# Patient Record
Sex: Male | Born: 1950 | Race: Black or African American | Hispanic: No | Marital: Married | Smoking: Never smoker
Health system: Southern US, Community
[De-identification: ages and names within clinical notes are randomized; demographics above are authoritative.]

## PROBLEM LIST (undated history)

## (undated) DIAGNOSIS — E7800 Pure hypercholesterolemia, unspecified: Secondary | ICD-10-CM

## (undated) DIAGNOSIS — I10 Essential (primary) hypertension: Secondary | ICD-10-CM

## (undated) HISTORY — PX: NO PAST SURGERIES: SHX2092

## (undated) HISTORY — DX: Essential (primary) hypertension: I10

---

## 1998-08-24 ENCOUNTER — Emergency Department (HOSPITAL_COMMUNITY): Admission: EM | Admit: 1998-08-24 | Discharge: 1998-08-24 | Payer: Self-pay | Admitting: Emergency Medicine

## 2000-06-15 ENCOUNTER — Ambulatory Visit (HOSPITAL_COMMUNITY): Admission: RE | Admit: 2000-06-15 | Discharge: 2000-06-15 | Payer: Self-pay | Admitting: *Deleted

## 2000-06-15 ENCOUNTER — Encounter: Payer: Self-pay | Admitting: Family Medicine

## 2003-09-14 ENCOUNTER — Emergency Department (HOSPITAL_COMMUNITY): Admission: EM | Admit: 2003-09-14 | Discharge: 2003-09-14 | Payer: Self-pay | Admitting: Emergency Medicine

## 2003-09-17 ENCOUNTER — Emergency Department (HOSPITAL_COMMUNITY): Admission: EM | Admit: 2003-09-17 | Discharge: 2003-09-17 | Payer: Self-pay | Admitting: Emergency Medicine

## 2003-09-18 ENCOUNTER — Ambulatory Visit (HOSPITAL_COMMUNITY): Admission: RE | Admit: 2003-09-18 | Discharge: 2003-09-18 | Payer: Self-pay | Admitting: Family Medicine

## 2005-01-04 ENCOUNTER — Emergency Department (HOSPITAL_COMMUNITY): Admission: EM | Admit: 2005-01-04 | Discharge: 2005-01-04 | Payer: Self-pay | Admitting: Family Medicine

## 2006-05-26 ENCOUNTER — Emergency Department (HOSPITAL_COMMUNITY): Admission: EM | Admit: 2006-05-26 | Discharge: 2006-05-26 | Payer: Self-pay | Admitting: Emergency Medicine

## 2007-08-28 ENCOUNTER — Emergency Department (HOSPITAL_COMMUNITY): Admission: EM | Admit: 2007-08-28 | Discharge: 2007-08-28 | Payer: Self-pay | Admitting: Family Medicine

## 2007-09-04 ENCOUNTER — Emergency Department (HOSPITAL_COMMUNITY): Admission: EM | Admit: 2007-09-04 | Discharge: 2007-09-04 | Payer: Self-pay | Admitting: Family Medicine

## 2008-10-23 ENCOUNTER — Emergency Department (HOSPITAL_COMMUNITY): Admission: EM | Admit: 2008-10-23 | Discharge: 2008-10-23 | Payer: Self-pay | Admitting: Emergency Medicine

## 2010-04-01 ENCOUNTER — Emergency Department (HOSPITAL_COMMUNITY): Admission: EM | Admit: 2010-04-01 | Discharge: 2010-04-01 | Payer: Self-pay | Admitting: Family Medicine

## 2010-09-29 LAB — POCT URINALYSIS DIP (DEVICE)
Bilirubin Urine: NEGATIVE
Glucose, UA: NEGATIVE mg/dL
Hgb urine dipstick: NEGATIVE
Ketones, ur: NEGATIVE mg/dL
Nitrite: NEGATIVE
Protein, ur: NEGATIVE mg/dL
Specific Gravity, Urine: 1.015 (ref 1.005–1.030)
Urobilinogen, UA: 1 mg/dL (ref 0.0–1.0)
pH: 5.5 (ref 5.0–8.0)

## 2011-03-15 LAB — POCT URINALYSIS DIP (DEVICE)
Glucose, UA: NEGATIVE
Glucose, UA: NEGATIVE
Hgb urine dipstick: NEGATIVE
Ketones, ur: NEGATIVE
Nitrite: POSITIVE — AB
Operator id: 235561
Operator id: 116391
Protein, ur: 30 — AB
Protein, ur: NEGATIVE
Specific Gravity, Urine: 1.015
Specific Gravity, Urine: 1.02
Urobilinogen, UA: 1
Urobilinogen, UA: 0.2
pH: 5.5

## 2011-03-15 LAB — POCT I-STAT, CHEM 8
BUN: 15
Calcium, Ion: 1.17
Chloride: 102
Creatinine, Ser: 1.6 — ABNORMAL HIGH
Glucose, Bld: 112 — ABNORMAL HIGH
HCT: 43
Hemoglobin: 14.6
Potassium: 3.5
Sodium: 139
TCO2: 26

## 2013-07-16 ENCOUNTER — Emergency Department (HOSPITAL_COMMUNITY): Payer: Medicare Other

## 2013-07-16 ENCOUNTER — Encounter (HOSPITAL_COMMUNITY): Payer: Self-pay | Admitting: Emergency Medicine

## 2013-07-16 ENCOUNTER — Emergency Department (HOSPITAL_COMMUNITY)
Admission: EM | Admit: 2013-07-16 | Discharge: 2013-07-16 | Disposition: A | Discharge status: Home / Self Care | Payer: Medicare Other | Attending: Emergency Medicine | Admitting: Emergency Medicine

## 2013-07-16 DIAGNOSIS — R509 Fever, unspecified: Secondary | ICD-10-CM | POA: Insufficient documentation

## 2013-07-16 DIAGNOSIS — K529 Noninfective gastroenteritis and colitis, unspecified: Secondary | ICD-10-CM

## 2013-07-16 DIAGNOSIS — R42 Dizziness and giddiness: Secondary | ICD-10-CM | POA: Insufficient documentation

## 2013-07-16 DIAGNOSIS — R5381 Other malaise: Secondary | ICD-10-CM | POA: Insufficient documentation

## 2013-07-16 DIAGNOSIS — R05 Cough: Secondary | ICD-10-CM | POA: Insufficient documentation

## 2013-07-16 DIAGNOSIS — K5289 Other specified noninfective gastroenteritis and colitis: Secondary | ICD-10-CM | POA: Insufficient documentation

## 2013-07-16 DIAGNOSIS — M25569 Pain in unspecified knee: Secondary | ICD-10-CM | POA: Insufficient documentation

## 2013-07-16 DIAGNOSIS — R059 Cough, unspecified: Secondary | ICD-10-CM | POA: Insufficient documentation

## 2013-07-16 DIAGNOSIS — R5383 Other fatigue: Secondary | ICD-10-CM

## 2013-07-16 LAB — BASIC METABOLIC PANEL
BUN: 21 mg/dL (ref 6–23)
CHLORIDE: 100 meq/L (ref 96–112)
CO2: 24 mEq/L (ref 19–32)
Calcium: 9.3 mg/dL (ref 8.4–10.5)
Creatinine, Ser: 1.24 mg/dL (ref 0.50–1.35)
GFR calc non Af Amer: 62 mL/min — ABNORMAL LOW (ref >90)
GFR, EST AFRICAN AMERICAN: 71 mL/min — AB (ref >90)
Glucose, Bld: 98 mg/dL (ref 70–99)
POTASSIUM: 3.7 meq/L (ref 3.7–5.3)
SODIUM: 140 meq/L (ref 137–147)

## 2013-07-16 LAB — HEPATIC FUNCTION PANEL
ALT: 30 U/L (ref 0–53)
AST: 29 U/L (ref 0–37)
Albumin: 4.3 g/dL (ref 3.5–5.2)
Alkaline Phosphatase: 78 U/L (ref 39–117)
Bilirubin, Direct: <0.2 mg/dL (ref 0.0–0.3)
TOTAL PROTEIN: 8.4 g/dL — AB (ref 6.0–8.3)
Total Bilirubin: 1.1 mg/dL (ref 0.3–1.2)

## 2013-07-16 LAB — CBC WITH DIFFERENTIAL/PLATELET
BASOS ABS: 0 10*3/uL (ref 0.0–0.1)
BASOS PCT: 0 % (ref 0–1)
Eosinophils Absolute: 0 10*3/uL (ref 0.0–0.7)
Eosinophils Relative: 0 % (ref 0–5)
HCT: 46.2 % (ref 39.0–52.0)
HEMOGLOBIN: 16.7 g/dL (ref 13.0–17.0)
Lymphocytes Relative: 3 % — ABNORMAL LOW (ref 12–46)
Lymphs Abs: 0.3 10*3/uL — ABNORMAL LOW (ref 0.7–4.0)
MCH: 31.8 pg (ref 26.0–34.0)
MCHC: 36.1 g/dL — AB (ref 30.0–36.0)
MCV: 88 fL (ref 78.0–100.0)
MONOS PCT: 3 % (ref 3–12)
Monocytes Absolute: 0.3 10*3/uL (ref 0.1–1.0)
NEUTROS ABS: 9 10*3/uL — AB (ref 1.7–7.7)
NEUTROS PCT: 94 % — AB (ref 43–77)
Platelets: 116 10*3/uL — ABNORMAL LOW (ref 150–400)
RBC: 5.25 MIL/uL (ref 4.22–5.81)
RDW: 13.3 % (ref 11.5–15.5)
WBC: 9.6 10*3/uL (ref 4.0–10.5)

## 2013-07-16 LAB — URINALYSIS, ROUTINE W REFLEX MICROSCOPIC
Bilirubin Urine: NEGATIVE
GLUCOSE, UA: NEGATIVE mg/dL
HGB URINE DIPSTICK: NEGATIVE
Ketones, ur: NEGATIVE mg/dL
Leukocytes, UA: NEGATIVE
Nitrite: NEGATIVE
Protein, ur: NEGATIVE mg/dL
UROBILINOGEN UA: 0.2 mg/dL (ref 0.0–1.0)
pH: 5.5 (ref 5.0–8.0)

## 2013-07-16 LAB — POCT I-STAT TROPONIN I
TROPONIN I, POC: 0 ng/mL (ref 0.00–0.08)
Troponin i, poc: 0.01 ng/mL (ref 0.00–0.08)

## 2013-07-16 LAB — LIPASE, BLOOD: Lipase: 22 U/L (ref 11–59)

## 2013-07-16 MED ORDER — SODIUM CHLORIDE 0.9 % IV BOLUS (SEPSIS)
1000.0000 mL | Freq: Once | INTRAVENOUS | Status: AC
  Administered 2013-07-16: 1000 mL via INTRAVENOUS

## 2013-07-16 MED ORDER — MORPHINE SULFATE 4 MG/ML IJ SOLN
4.0000 mg | Freq: Once | INTRAMUSCULAR | Status: AC
  Administered 2013-07-16: 4 mg via INTRAVENOUS
  Filled 2013-07-16: qty 1

## 2013-07-16 MED ORDER — ONDANSETRON HCL 4 MG PO TABS: 4.0000 mg | ORAL_TABLET | Freq: Four times a day (QID) | ORAL | Status: DC | PRN

## 2013-07-16 MED ORDER — IOHEXOL 300 MG/ML  SOLN: 80.0000 mL | Freq: Once | INTRAMUSCULAR | Status: DC | PRN

## 2013-07-16 MED ORDER — IOHEXOL 300 MG/ML  SOLN
100.0000 mL | Freq: Once | INTRAMUSCULAR | Status: AC | PRN
  Administered 2013-07-16: 100 mL via INTRAVENOUS

## 2013-07-16 MED ORDER — ONDANSETRON HCL 4 MG/2ML IJ SOLN
4.0000 mg | Freq: Once | INTRAMUSCULAR | Status: AC
  Administered 2013-07-16: 4 mg via INTRAVENOUS
  Filled 2013-07-16: qty 2

## 2013-07-16 NOTE — ED Notes (Signed)
Pt began having nausea and vomiting and abdominal pain at 3am.  Pt has vomited 10+ times since then. Pt states that his abdominal pain increases with palpation.  Pt had temp 100F, no GU symptoms

## 2013-07-16 NOTE — Discharge Instructions (Signed)
°Emergency Department Resource Guide °1) Find a Doctor and Pay Out of Pocket °Although you won't have to find out who is covered by your insurance plan, it is a good idea to ask around and get recommendations. You will then need to call the office and see if the doctor you have chosen will accept you as a new patient and what types of options they offer for patients who are self-pay. Some doctors offer discounts or will set up payment plans for their patients who do not have insurance, but you will need to ask so you aren't surprised when you get to your appointment. ° °2) Contact Your Local Health Department °Not all health departments have doctors that can see patients for sick visits, but many do, so it is worth a call to see if yours does. If you don't know where your local health department is, you can check in your phone book. The CDC also has a tool to help you locate your state's health department, and many state websites also have listings of all of their local health departments. ° °3) Find a Walk-in Clinic °If your illness is not likely to be very severe or complicated, you may want to try a walk in clinic. These are popping up all over the country in pharmacies, drugstores, and shopping centers. They're usually staffed by nurse practitioners or physician assistants that have been trained to treat common illnesses and complaints. They're usually fairly quick and inexpensive. However, if you have serious medical issues or chronic medical problems, these are probably not your best option. ° °No Primary Care Doctor: °- Call [facility] at  832-8000 - they can help you locate a primary care doctor that  accepts your insurance, provides certain services, etc. °- Physician Referral Service- 1-800-533-3463 ° °Chronic Pain Problems: °Organization         Address  Phone   Notes  °[facility]  (336) 297-2271 Patients need to be referred by their primary care doctor.  ° °Medication  Assistance: °Organization         Address  Phone   Notes  °[place] [facility] [address] °[place], [place] [address] (336) 641-8030 --Must be a resident of [place] °-- Must have NO insurance coverage whatsoever (no Medicaid/ Medicare, etc.) °-- The pt. MUST have a primary care doctor that directs their care regularly and follows them in the community °  °[facility]  (866) 331-1348   °[employer]  (888) 892-1162   ° °Agencies that provide inexpensive medical care: °Organization         Address  Phone   Notes  °[facility]  (336) 832-8035   °[facility]    (336) 832-7272   °[facility] [address] °[place], [place] [address] (336) 832-4777   °[facility] [address], °[place] (336) 271-4999   °[facility]    (336) 373-0678   °[facility]    (336) 272-1050   °[facility] ° [address], [place] Phone:  (336) 832-4444, Fax:  (336) 832-4440 Hours of Operation:  9 am - 6 pm, M-F.  Also accepts Medicaid/Medicare and self-pay.  °[facility] ° [address], [place] Phone: (336) 832-3150, Fax: (336) 832-3151. Hours of Operation:  8:30 am - 5:30 pm, M-F.  Also accepts Medicaid and self-pay.  °[facility] [place] [address], [place] Phone: (336) 878-6027   °[facility] [address], [place], [place] (336)723-1848, Ext. 123 Mondays & Thursdays: 7-9 AM.  First 15 patients are seen on a first come, first serve basis. °  ° °Medicaid-accepting [place] Providers: ° °Organization         Address  Phone   Notes  °[facility] [address], [place] (336) 641-2100 Also accepts self-pay patients.  °[facility] [address], [place] ° (336) 856-9996   °[facility] [address], [place]  (336) 288-8857   °[facility] [address] [place] [address], [place] (336) 299-7000   °Veita Bland [address], [place]  ° (336) 373-1557 Only accepts [place] Access Medicaid patients after they have their name applied to their card.  ° °Self-Pay (no insurance) in [place]: ° °Organization         Address  Phone   Notes  °Sickle Cell Patients, [facility] [address], [place] (336) 832-1970   °[facility] [address], [place] (336) 832-4400   °[facility] ° [address], [place] (336) 992-4800   °[facility]/Dr. Osei-Bonsu ° [address] [place] [address], [place] or [address], [place] (336) 841-8500 Phone number for both [place] and [place] locations is the same.  °[facility] [address], [place] (336) 299-0000   °[facility] [address] [place] [address], [place] or [address] (336) 852-7530 °(336) 878-2260   °[facility] [address], [place] (336) 350-1642, phone; (336) 294-5005, fax Sees patients 1st and 3rd Saturday of every month.  Must not qualify for public or private insurance (i.e. Medicaid, Medicare, [place] Health Choice, Veterans' Benefits) • Household income should be no more than 200% of the poverty level •The clinic cannot treat you if you are pregnant or think you are pregnant • Sexually transmitted diseases are not treated at the clinic.  ° ° °Dental Care: °Organization         Address  Phone  Notes  °[place] [facility] [address], [place] (336) 641-6152 Accepts children up to age 21 who are enrolled in Medicaid or [place] Health Choice; pregnant women with a Medicaid card; and children who have applied for Medicaid or [place] Health Choice, but were declined, whose parents can pay a reduced fee at time of service.  °[place]  [facility] [place]  [address], [place] (336) 641-7733 Accepts children up to age 21 who are enrolled in Medicaid or [place] Health Choice; pregnant women with a Medicaid card; and children who have applied for Medicaid or [place] Health Choice, but were declined, whose parents can pay a reduced fee at time of service.  °[facility] ° [address], [place] (336) 641-4533 Patients are seen by appointment only. Walk-ins are not accepted. [facility] will see patients 18 years of age and older. °Monday - Tuesday (8am-5pm) °Most Wednesdays (8:30-5pm) °$30 per visit, cash only  °[facility] ° [address], [place] (336) 641-4533 Patients are seen by appointment only. Walk-ins are not accepted. [facility] will see patients 18 years of age and older. °One   Wednesday Evening (Monthly: Volunteer Based).  $30 per visit, cash only  °[facility]  (919) 537-3737 for adults; Children under age 4, call Graduate Pediatric Dentistry at (919) [phone]. Children aged 4-14, please call (919) [phone] to request a pediatric application. ° Dental services are provided in all areas of dental care including fillings, crowns and bridges, complete and partial dentures, implants, gum treatment, root canals, and extractions. Preventive care is also provided. Treatment is provided to both adults and children. °Patients are selected via a lottery and there is often a waiting list. °  °[facility] [address], °[place] ° (336) 763-8833 [email or web address] °  °[facility] [address], [place], [place] (336)723-1848, Ext. 123 Second and Fourth Thursday of each month, opens at 6:30 AM; Clinic ends at 9 AM.  Patients are seen on a first-come first-served basis, and a limited number are seen during each clinic.  ° °[facility] ° [address], [place], [place] (336) 723-7904    Eligibility Requirements °You must have lived in [place], [place], or [place] counties for at least the last three months. °  You cannot be eligible for state or federal sponsored healthcare insurance, including Veterans Administration, Medicaid, or Medicare. °  You generally cannot be eligible for healthcare insurance through your employer.  °  How to apply: °Eligibility screenings are held every Tuesday and Wednesday afternoon from 1:00 pm until 4:00 pm. You do not need an appointment for the interview!  °[facility] [address], [place], [place] 336-631-2330   °[facility]  336-342-8273   °[facility]  336-703-3100   °[facility]  336-570-6415   ° °Behavioral Health Resources in the Community: °Intensive Outpatient Programs °Organization         Address  Phone  Notes  °[facility] [address], [place], [place] 336-878-6098   °[facility] [address], [place], [place] 336-832-9800   °[facility] [address], [place], [place] ° 336-882-2125   °[facility] [address],  °[place], [place] 1-800-853-5163 or 336-641-4981   °Substance Abuse Resources °Organization         Address  Phone  Notes  °[facility]  336-882-2125   °[facility]  336-784-9470   °[facility]  336-285-9073   °[facility]  336-845-3988   °Residential & Outpatient Substance Abuse Program  1-800-659-3381   °Psychological Services °Organization         Address  Phone  Notes  °[facility]  336- 832-9600   °[facility]  336- 378-7881   °[facility] [address], [place] 1-800-853-5163 or 336-641-4981   ° °Mobile Crisis Teams °Organization         Address  Phone  Notes  °[facility], Mobile Crisis Care Unit  1-877-626-1772   °Assertive °[facility] ° [address]  [place],  336-834-9664   °Sharon DeEsch [address] °[place]  336-554-5454   ° °Self-Help/Support Groups °Organization         Address  Phone             Notes  °[facility]  - variety of support groups  336- 373-1402 Call for more information  °Narcotics Anonymous (NA), [facility] [address], °[place]   2 meetings at this location  ° °  Residential Treatment Programs °Organization         Address  Phone  Notes  °[facility] [address],    °[place]  1-866-801-8205   °[facility] ° [address], [place], [place] 704-293-8524   °[facility] [address], [place] 336-845-3988 Admissions: 8am-3pm M-F  °[facility] [address]    °[place], [place] 336-841-1104   °[facility] [address], [place], [place] 336-379-7146   °[facility] [address]  °[place], [place] 336-285-9073   °[facility] Intensive Outpatient [address], [place], [place] 336-852-3033   °[facility] [address]  °[place], [place] 1-877-615-2722 or 336-784-9470   °[facility] [address] [place], [place] 336-227-7417 Accepts Medicaid  °[facility] [address]  °[place] 1-800-659-3381 Substance Abuse/Addiction Treatment  ° °[place] Behavioral Health Resources °Organization         Address  Phone  Notes  °[facility]  (888) 581-9988   °Julie Brannon, PhD [address] [place], [place]   (336) 349-5553 or (336) 951-0000   °[facility]   [address] °[place], [place] (336) 349-4454   °[facility] [address], [place], [place] (336) 342-8316 Insurance/Medicaid/sponsorship through [facility]  °[facility] [address] [place] [address]                                    [place],  (336) 342-8316 Therapy/tele-psych/case    °[facility] [address]  ° [place],  (336) 349-2233    °Dr. Arfeen  (336) 349-4544   °[facility] [place]  [employer] [place] [facility] 1) [address], [place] °2) [address], [place] °3)  [address], [place] (336) 349-3220 °(336) 342-7768 ° °(336) 342-8140   °[place] [facility] (336) 342-1394 or (336) 342-3537 (After Hours)    ° ° °

## 2013-07-16 NOTE — Progress Notes (Signed)
   CARE MANAGEMENT ED NOTE 07/16/2013  Patient:  [name],[name]   Account Number:  [id number]  Date Initiated:  07/16/2013  Documentation initiated by:  Livia Snellen  Subjective/Objective Assessment:   Patient presents to Ed with n/v/ and abdominal pain.     Subjective/Objective Assessment Detail:   Patient without pmhx.  CT of abdomen completed.  UA negative.  Patient afebrile     Action/Plan:   Action/Plan Detail:   Anticipated DC Date:  07/16/2013     Status Recommendation to Physician:   Result of Recommendation:    Other ED [place]  PCP issues    Choice offered to / List presented to:            Status of service:  Completed, signed off  ED Comments:   ED Comments Detail:  EDCM spoke to patient at bedside.  Patient reports he does not have a pcp but does have [employer].  EDCM printed up list of pcps who accept AARP insurance within a 5 mile zip code of patient, but patient left before [facility] could provide this list.

## 2013-07-16 NOTE — ED Notes (Addendum)
This RN clicked off the wrong Pt's urine.  It will need to be recollected.

## 2013-07-16 NOTE — ED Provider Notes (Signed)
CSN: [id number]     Arrival date & time 07/16/13  1147 History   First MD Initiated Contact with Patient 07/16/13 1501     Chief Complaint  Patient presents with  . Abdominal Pain   (Consider location/radiation/quality/duration/timing/severity/associated sxs/prior Treatment) HPI Comments: 63 year old male presents with nausea, vomiting, and diarrhea since approximately 15 hours ago. The patient is an overall poor historian and most of the history taken from the wife. He states he woke up in the middle night at midnight and has since had multiple episodes of vomiting. There is no blood in his vomit. He is also had many loose watery stools. A few hours ago started having cramping abdominal pain as well. He points to his lower abdomen as the location of his pain but there is no specific area. The wife states patient had a temperature up to 100 earlier today. Patient is also complaining of bilateral knee pain. Started similar times other symptoms. Patient states he has felt weak and dizzy during this time as well. He states he ate at [employer] last night about 7 hours prior to symptoms but did not feel the food tasted bad. No back pain or urinary symptoms. He had a cough yesterday but none currently. He currently rates his abdominal pain as 10/10. Patient states while he was in the waiting room he felt like his legs were weak and he was dizzy for approximately 10-20 minutes. Does not feel this way now. No dyspnea or chest pain.    History reviewed. No pertinent past medical history. History reviewed. No pertinent past surgical history. No family history on file. History  Substance Use Topics  . Smoking status: Never Smoker   . Smokeless tobacco: Not on file  . Alcohol Use: No    Review of Systems  Constitutional: Positive for fever.  Respiratory: Positive for cough. Negative for shortness of breath.   Cardiovascular: Negative for chest pain.  Gastrointestinal: Positive for nausea, vomiting, abdominal  pain and diarrhea. Negative for constipation and blood in stool.  Genitourinary: Negative for dysuria.  Musculoskeletal: Positive for arthralgias (bilaterals knees hurt). Negative for back pain and myalgias.  Neurological: Positive for dizziness (felt dizzy once in the waiting room) and weakness.  All other systems reviewed and are negative.    Allergies  Review of patient's allergies indicates no known allergies.  Home Medications  No current outpatient prescriptions on file. BP 123/83  Pulse 91  Temp(Src) 98.8 F (37.1 C) (Oral)  Resp 18  SpO2 97% Physical Exam  Nursing note and vitals reviewed. Constitutional: He is oriented to person, place, and time. He appears well-developed and well-nourished. No distress.  HENT:  Head: Normocephalic and atraumatic.  Right Ear: External ear normal.  Left Ear: External ear normal.  Nose: Nose normal.  Eyes: Right eye exhibits no discharge. Left eye exhibits no discharge.  Neck: Neck supple.  Cardiovascular: Normal rate, regular rhythm, normal heart sounds and intact distal pulses.   Pulmonary/Chest: Effort normal and breath sounds normal.  Abdominal: Soft. There is tenderness (mild lower abd tenderness). There is no CVA tenderness.  Musculoskeletal: He exhibits no edema.       Right knee: He exhibits normal range of motion and no swelling. No tenderness found.       Left knee: He exhibits normal range of motion and no swelling. No tenderness found.  Neurological: He is alert and oriented to person, place, and time.  Skin: Skin is warm and dry.    ED Course  Procedures (including critical care time) Labs Review Labs Reviewed  CBC WITH DIFFERENTIAL - Abnormal; Notable for the following:    MCHC 36.1 (*)    Platelets 116 (*)    Neutrophils Relative % 94 (*)    Neutro Abs 9.0 (*)    Lymphocytes Relative 3 (*)    Lymphs Abs 0.3 (*)    All other components within normal limits  BASIC METABOLIC PANEL - Abnormal; Notable for the  following:    GFR calc non Af Amer 62 (*)    GFR calc Af Amer 71 (*)    All other components within normal limits  HEPATIC FUNCTION PANEL - Abnormal; Notable for the following:    Total Protein 8.4 (*)    All other components within normal limits  URINALYSIS, ROUTINE W REFLEX MICROSCOPIC - Abnormal; Notable for the following:    Specific Gravity, Urine >1.046 (*)    All other components within normal limits  LIPASE, BLOOD  POCT I-STAT TROPONIN I  POCT I-STAT TROPONIN I   Imaging Review Ct Abdomen Pelvis W Contrast  07/16/2013   CLINICAL DATA:  Abdominal pain, nausea and vomiting  EXAM: CT ABDOMEN AND PELVIS WITH CONTRAST  TECHNIQUE: Multidetector CT imaging of the abdomen and pelvis was performed using the standard protocol following bolus administration of intravenous contrast.  CONTRAST:  174mL OMNIPAQUE IOHEXOL 300 MG/ML  SOLN  COMPARISON:  None.  FINDINGS: Minimal bibasilar atelectasis. Normal heart size. No pericardial or pleural effusion. No hiatal hernia.  Abdomen: Liver demonstrates a 5 mm punctate hypodensity in the right hepatic dome, image 13 too small to definitively characterize but suspect small hepatic cyst. No other hepatic abnormality. No biliary dilatation. Hepatic and portal veins are patent. Gallbladder, biliary system, pancreas, spleen, adrenal glands, and kidneys are within normal limits for age and demonstrate no acute process. Kidneys demonstrate no renal obstruction or hydronephrosis. Incidental 10 mm cortical cyst in the right kidney midpole, image 36.  No abdominal free fluid, fluid collection, hemorrhage, abscess, or adenopathy.  Negative for bowel obstruction, dilatation, ileus, or free air. Normal appendix.  Atherosclerosis of the aorta without aneurysm.  Diffuse colonic diverticulosis.  No acute inflammatory process.  Pelvis: No pelvic free fluid, fluid collection, hemorrhage, abscess, adenopathy, inguinal abnormality, or hernia. Urinary bladder unremarkable. Pelvic  calcifications consistent with venous phleboliths.  Diffuse degenerative changes noted of the spine with mild scoliosis.  IMPRESSION: No acute intra-abdominal or pelvic finding.  Probable incidental 5 mm hepatic dome cyst  Incidental right renal cyst  Normal appendix  Diverticulosis   Electronically Signed   By: Daryll Brod M.D.   On: 07/16/2013 18:43    EKG Interpretation   None       Date: 07/16/2013  Rate: 64  Rhythm: normal sinus rhythm  QRS Axis: left  Intervals: normal  ST/T Wave abnormalities: nonspecific ST/T changes  Conduction Disutrbances:none  Narrative Interpretation:   Old EKG Reviewed: none available   MDM   1. Gastroenteritis    Patient is well appearing, has benign exam except nonspecific tenderness. C/w viral gastroenteritis, but due to age with abd pain a Ct was obtained (neg). Due to his nonspecific dizziness, EKG obtained, is abnormal but nonspecific. D/w the EKG with Dr. Julianne Handler of cards, who in this setting agrees it's abnormal but nonspecific and recommends 2 troponins and if neg discharge with outpatient f/u. Feel his dizziness is most likely related to his fluid loss. Feels improved with pain meds, zofran and fluids. Feel he is stable  for discharge. I do not believe his dizziness is from ACS, especially with no other symptoms such as CP, dyspnea or sweating.     Ephraim Hamburger, MD 07/16/13 [phone]

## 2014-02-19 ENCOUNTER — Ambulatory Visit (INDEPENDENT_AMBULATORY_CARE_PROVIDER_SITE_OTHER): Payer: Medicare Other | Admitting: Emergency Medicine

## 2014-02-19 VITALS — BP 120/80 | HR 74 | Temp 98.4°F | Resp 24 | Ht 72.0 in | Wt 226.4 lb

## 2014-02-19 DIAGNOSIS — K5732 Diverticulitis of large intestine without perforation or abscess without bleeding: Secondary | ICD-10-CM

## 2014-02-19 DIAGNOSIS — R1032 Left lower quadrant pain: Secondary | ICD-10-CM

## 2014-02-19 LAB — POCT URINALYSIS DIPSTICK
BILIRUBIN UA: NEGATIVE
GLUCOSE UA: NEGATIVE
KETONES UA: NEGATIVE
Leukocytes, UA: NEGATIVE
Nitrite, UA: NEGATIVE
Protein, UA: NEGATIVE
RBC UA: NEGATIVE
UROBILINOGEN UA: 0.2
pH, UA: 5.5

## 2014-02-19 LAB — POCT CBC
GRANULOCYTE PERCENT: 76.4 % (ref 37–80)
HCT, POC: 45.5 % (ref 43.5–53.7)
HEMOGLOBIN: 14.9 g/dL (ref 14.1–18.1)
Lymph, poc: 1.9 (ref 0.6–3.4)
MCH: 30.4 pg (ref 27–31.2)
MCHC: 32.8 g/dL (ref 31.8–35.4)
MCV: 92.7 fL (ref 80–97)
MID (cbc): 0.9 (ref 0–0.9)
MPV: 9 fL (ref 0–99.8)
POC Granulocyte: 9.2 — AB (ref 2–6.9)
POC LYMPH PERCENT: 16.1 %L (ref 10–50)
POC MID %: 7.5 % (ref 0–12)
Platelet Count, POC: 127 10*3/uL — AB (ref 142–424)
RBC: 4.91 M/uL (ref 4.69–6.13)
RDW, POC: 14.2 %
WBC: 16.1 10*3/uL — AB (ref 4.6–10.2)

## 2014-02-19 LAB — POCT UA - MICROSCOPIC ONLY
BACTERIA, U MICROSCOPIC: NEGATIVE
CASTS, UR, LPF, POC: NEGATIVE
Crystals, Ur, HPF, POC: NEGATIVE
MUCUS UA: NEGATIVE
RBC, URINE, MICROSCOPIC: NEGATIVE
WBC, Ur, HPF, POC: NEGATIVE
Yeast, UA: NEGATIVE

## 2014-02-19 MED ORDER — METRONIDAZOLE 500 MG PO TABS: 500.0000 mg | ORAL_TABLET | Freq: Four times a day (QID) | ORAL | Status: DC

## 2014-02-19 MED ORDER — CIPROFLOXACIN HCL 500 MG PO TABS: 500.0000 mg | ORAL_TABLET | Freq: Two times a day (BID) | ORAL | Status: DC

## 2014-02-19 NOTE — Patient Instructions (Signed)

## 2014-02-19 NOTE — Progress Notes (Signed)
[facility] [address] [place] [address] [phone]  Date:  02/19/2014   Name:  [name] [name]   DOB:  February 20, 1951   MRN:  [id number]  PCP:  No primary provider on file.    Chief Complaint: Abdominal Pain   History of Present Illness:  [name] [name] is a 63 y.o. very pleasant male patient who presents with the following:  Ill with lower abdominal pain started Monday and worse last night so that it interfered with sleep.  No nausea or vomiting. Stools were a bit hard.  The patient has no complaint of blood, mucous, or pus in her stools. No dysuria or urgency.  No nocturia.   Appetite is less.  Says he had a "low grade" fever with no chills. No improvement with over the counter medications or other home remedies. Denies other complaint or health concern today.   There are no active problems to display for this patient.   History reviewed. No pertinent past medical history.  History reviewed. No pertinent past surgical history.  History  Substance Use Topics  . Smoking status: Never Smoker   . Smokeless tobacco: Never Used  . Alcohol Use: No    Family History  Problem Relation Age of Onset  . Cancer Mother   . Heart disease Father     No Known Allergies  Medication list has been reviewed and updated.  Current Outpatient Prescriptions on File Prior to Visit  Medication Sig Dispense Refill  . ondansetron (ZOFRAN) 4 MG tablet Take 1 tablet (4 mg total) by mouth every 6 (six) hours as needed for nausea or vomiting.  10 tablet  0   No current facility-administered medications on file prior to visit.    Review of Systems:  As per HPI, otherwise negative.    Physical Examination: Filed Vitals:   02/19/14 1938  BP: 120/80  Pulse: 74  Temp: 98.4 F (36.9 C)  Resp: 24   Filed Vitals:   02/19/14 1938  Height: 6' (1.829 m)  Weight: 226 lb 6 oz (102.683 kg)   Body mass index is 30.7 kg/(m^2). Ideal Body Weight: Weight in (lb)  to have BMI = 25: 183.9  GEN: WDWN, NAD, Non-toxic, A & O x 3 HEENT: Atraumatic, Normocephalic. Neck supple. No masses, No LAD. Ears and Nose: No external deformity. CV: RRR, No M/G/R. No JVD. No thrill. No extra heart sounds. PULM: CTA B, no wheezes, crackles, rhonchi. No retractions. No resp. distress. No accessory muscle use. ABD: S, left abdominal tenderness, ND, +BS. No rebound. No HSM. EXTR: No c/c/e NEURO Normal gait.  PSYCH: Normally interactive. Conversant. Not depressed or anxious appearing.  Calm demeanor.    Assessment and Plan: Diverticulitis cipro Flagyl  Signed,  Ellison Carwin, MD   Results for orders placed in visit on 02/19/14  POCT CBC      Result Value Ref Range   WBC 16.1 (*) 4.6 - 10.2 K/uL   Lymph, poc 1.9  0.6 - 3.4   POC LYMPH PERCENT 16.1  10 - 50 %L   MID (cbc) 0.9  0 - 0.9   POC MID % 7.5  0 - 12 %M   POC Granulocyte 9.2 (*) 2 - 6.9   Granulocyte percent 76.4  37 - 80 %G   RBC 4.91  4.69 - 6.13 M/uL   Hemoglobin 14.9  14.1 - 18.1 g/dL   HCT, POC 45.5  43.5 - 53.7 %   MCV 92.7  80 -  97 fL   MCH, POC 30.4  27 - 31.2 pg   MCHC 32.8  31.8 - 35.4 g/dL   RDW, POC 14.2     Platelet Count, POC 127 (*) 142 - 424 K/uL   MPV 9.0  0 - 99.8 fL  POCT UA - MICROSCOPIC ONLY      Result Value Ref Range   WBC, Ur, HPF, POC neg     RBC, urine, microscopic neg     Bacteria, U Microscopic neg     Mucus, UA neg     Epithelial cells, urine per micros 0-1     Crystals, Ur, HPF, POC neg     Casts, Ur, LPF, POC neg     Yeast, UA neg    POCT URINALYSIS DIPSTICK      Result Value Ref Range   Color, UA yellow     Clarity, UA clear     Glucose, UA neg     Bilirubin, UA neg     Ketones, UA neg     Spec Grav, UA <=1.005     Blood, UA neg     pH, UA 5.5     Protein, UA neg     Urobilinogen, UA 0.2     Nitrite, UA neg     Leukocytes, UA Negative

## 2014-02-21 ENCOUNTER — Telehealth: Payer: Self-pay

## 2014-02-21 NOTE — Telephone Encounter (Signed)
Edythe Lynn called for patient.  You saw him on September 1.  She says he is now having back and stomach pains.  Please call [phone]

## 2014-02-21 NOTE — Telephone Encounter (Signed)
Spoke to pt he is doing better today. He states that his abd is just sore today. He has a lot of gas. He RTC if needed.

## 2014-06-29 ENCOUNTER — Ambulatory Visit (INDEPENDENT_AMBULATORY_CARE_PROVIDER_SITE_OTHER): Payer: Medicare Other | Admitting: Emergency Medicine

## 2014-06-29 VITALS — BP 132/78 | HR 61 | Temp 97.9°F | Resp 16 | Ht 73.0 in | Wt 236.8 lb

## 2014-06-29 DIAGNOSIS — J209 Acute bronchitis, unspecified: Secondary | ICD-10-CM

## 2014-06-29 MED ORDER — CLARITHROMYCIN 500 MG PO TABS: 500.0000 mg | ORAL_TABLET | Freq: Two times a day (BID) | ORAL | Status: DC

## 2014-06-29 MED ORDER — HYDROCOD POLST-CHLORPHEN POLST 10-8 MG/5ML PO LQCR: 5.0000 mL | Freq: Two times a day (BID) | ORAL | Status: DC | PRN

## 2014-06-29 NOTE — Progress Notes (Signed)
[facility] [address], [place] [address] [phone]  Date:  06/29/2014   Name:  [name] [name]   DOB:  1951/01/17   MRN:  [id number]  PCP:  No primary care provider on file.    Chief Complaint: Cough   History of Present Illness:  [name] [name] is a 64 y.o. very pleasant male patient who presents with the following:  Ill for a week with nasal congestion and watery drainage.   Has a cough productive of mucopurulent sputum.  No wheezing or shortness of breath. No nausea or vomiting.  No stool change. No fever or chills.  No improvement with over the counter medications or other home remedies. Denies other complaint or health concern today.   There are no active problems to display for this patient.   History reviewed. No pertinent past medical history.  History reviewed. No pertinent past surgical history.  History  Substance Use Topics  . Smoking status: Never Smoker   . Smokeless tobacco: Never Used  . Alcohol Use: No    Family History  Problem Relation Age of Onset  . Cancer Mother   . Heart disease Father     No Known Allergies  Medication list has been reviewed and updated.  No current outpatient prescriptions on file prior to visit.   No current facility-administered medications on file prior to visit.    Review of Systems:  As per HPI, otherwise negative.    Physical Examination: Filed Vitals:   06/29/14 1256  BP: 132/78  Pulse: 61  Temp: 97.9 F (36.6 C)  Resp: 16   Filed Vitals:   06/29/14 1256  Height: 6\' 1"  (1.854 m)  Weight: 236 lb 12.8 oz (107.412 kg)   Body mass index is 31.25 kg/(m^2). Ideal Body Weight: Weight in (lb) to have BMI = 25: 189.1  GEN: WDWN, NAD, Non-toxic, A & O x 3 HEENT: Atraumatic, Normocephalic. Neck supple. No masses, No LAD. Ears and Nose: No external deformity. CV: RRR, No M/G/R. No JVD. No thrill. No extra heart sounds. PULM: CTA B, no wheezes, crackles, rhonchi. No  retractions. No resp. distress. No accessory muscle use. ABD: S, NT, ND, +BS. No rebound. No HSM. EXTR: No c/c/e NEURO Normal gait.  PSYCH: Normally interactive. Conversant. Not depressed or anxious appearing.  Calm demeanor.    Assessment and Plan: Bronchitis biaxin tussionex    Signed,  Ellison Carwin, MD

## 2014-06-29 NOTE — Patient Instructions (Signed)

## 2015-05-09 ENCOUNTER — Emergency Department (HOSPITAL_COMMUNITY)
Admission: EM | Admit: 2015-05-09 | Discharge: 2015-05-10 | Disposition: A | Discharge status: Home / Self Care | Payer: Medicare Other | Attending: Emergency Medicine | Admitting: Emergency Medicine

## 2015-05-09 ENCOUNTER — Encounter (HOSPITAL_COMMUNITY): Payer: Self-pay | Admitting: *Deleted

## 2015-05-09 DIAGNOSIS — I10 Essential (primary) hypertension: Secondary | ICD-10-CM | POA: Diagnosis not present

## 2015-05-09 DIAGNOSIS — K5900 Constipation, unspecified: Secondary | ICD-10-CM | POA: Diagnosis not present

## 2015-05-09 DIAGNOSIS — R1032 Left lower quadrant pain: Secondary | ICD-10-CM | POA: Diagnosis present

## 2015-05-09 LAB — COMPREHENSIVE METABOLIC PANEL
ALT: 34 U/L (ref 17–63)
AST: 41 U/L (ref 15–41)
Albumin: 4.3 g/dL (ref 3.5–5.0)
Alkaline Phosphatase: 76 U/L (ref 38–126)
Anion gap: 7 (ref 5–15)
BUN: 22 mg/dL — ABNORMAL HIGH (ref 6–20)
CHLORIDE: 107 mmol/L (ref 101–111)
CO2: 26 mmol/L (ref 22–32)
CREATININE: 1.48 mg/dL — AB (ref 0.61–1.24)
Calcium: 9.5 mg/dL (ref 8.9–10.3)
GFR calc non Af Amer: 49 mL/min — ABNORMAL LOW (ref >60)
GFR, EST AFRICAN AMERICAN: 57 mL/min — AB (ref >60)
Glucose, Bld: 107 mg/dL — ABNORMAL HIGH (ref 65–99)
POTASSIUM: 3.5 mmol/L (ref 3.5–5.1)
SODIUM: 140 mmol/L (ref 135–145)
Total Bilirubin: 0.9 mg/dL (ref 0.3–1.2)
Total Protein: 7.4 g/dL (ref 6.5–8.1)

## 2015-05-09 LAB — CBC
HEMATOCRIT: 40.6 % (ref 39.0–52.0)
Hemoglobin: 14.2 g/dL (ref 13.0–17.0)
MCH: 31.6 pg (ref 26.0–34.0)
MCHC: 35 g/dL (ref 30.0–36.0)
MCV: 90.2 fL (ref 78.0–100.0)
PLATELETS: 126 10*3/uL — AB (ref 150–400)
RBC: 4.5 MIL/uL (ref 4.22–5.81)
RDW: 13.4 % (ref 11.5–15.5)
WBC: 7.1 10*3/uL (ref 4.0–10.5)

## 2015-05-09 LAB — URINALYSIS, ROUTINE W REFLEX MICROSCOPIC
Bilirubin Urine: NEGATIVE
GLUCOSE, UA: NEGATIVE mg/dL
HGB URINE DIPSTICK: NEGATIVE
Ketones, ur: NEGATIVE mg/dL
Leukocytes, UA: NEGATIVE
Nitrite: NEGATIVE
PH: 6.5 (ref 5.0–8.0)
PROTEIN: NEGATIVE mg/dL
Specific Gravity, Urine: 1.01 (ref 1.005–1.030)

## 2015-05-09 LAB — LIPASE, BLOOD: LIPASE: 30 U/L (ref 11–51)

## 2015-05-09 NOTE — ED Notes (Addendum)
Pt complains of groin pain radiating to his lower abdomen since Sunday. Pt denies n/v/d. Pt denies urinary symptoms. Pt describes the pain as fullness and an urgency to evacuate his bowels. Pt's last bowel movement was at [place], which he states appeared normal.

## 2015-05-10 ENCOUNTER — Emergency Department (HOSPITAL_COMMUNITY): Payer: Medicare Other

## 2015-05-10 MED ORDER — POLYETHYLENE GLYCOL 3350 17 G PO PACK: 17.0000 g | PACK | Freq: Every day | ORAL | Status: DC

## 2015-05-10 MED ORDER — DOCUSATE SODIUM 100 MG PO CAPS: 100.0000 mg | ORAL_CAPSULE | Freq: Two times a day (BID) | ORAL | Status: DC

## 2015-05-10 MED ORDER — KETOROLAC TROMETHAMINE 30 MG/ML IJ SOLN
30.0000 mg | Freq: Once | INTRAMUSCULAR | Status: AC
  Administered 2015-05-10: 30 mg via INTRAMUSCULAR
  Filled 2015-05-10: qty 1

## 2015-05-10 MED ORDER — KETOROLAC TROMETHAMINE 30 MG/ML IJ SOLN: 60.0000 mg | Freq: Once | INTRAMUSCULAR | Status: DC

## 2015-05-10 NOTE — ED Notes (Signed)
Pt to xray

## 2015-05-10 NOTE — ED Provider Notes (Signed)
By signing my name below, I, Forrestine Him, attest that this documentation has been prepared under the direction and in the presence of [place], DO.  Electronically Signed: Forrestine Him, ED Scribe. 05/10/2015. 12:43 AM.   TIME SEEN: 12:33 AM   CHIEF COMPLAINT:  Chief Complaint  Patient presents with  . Abdominal Pain  . Groin Pain     HPI:  HPI Comments: [name] [name] is a 64 y.o. male without any pertinent past medical history who presents to the Emergency Department complaining of constant, ongoing lower abdomen that radiates mildly into his back bilaterally x 5 days. Pain is described as fullness. Better after a bowel movement. No other aggravating or alleviating factors at this time. No worsening pain after meals. No OTC medications or home remedies attempted prior to arrival.  No recent fever, chills, nausea, vomiting, or diarrhea. No dysuria, hematuria, or urinary frequency. Last normal bowel movement at 8:00 PM this evening. No black or tarry stools, hematochezia. No history of abdominal surgeries.  He is also hypertensive. Denies being on blood pressure medication. Denies headache, vision changes, numbness, tingling or focal weakness chest pain or shortness of breath.  PCP: No primary care provider on file.    ROS: See HPI Constitutional: no fever  Eyes: no drainage  ENT: no runny nose   Cardiovascular:  no chest pain  Resp: no SOB  GI: no vomiting. Positive abdominal pain GU: no dysuria Integumentary: no rash  Allergy: no hives  Musculoskeletal: no leg swelling  Neurological: no slurred speech ROS otherwise negative  PAST MEDICAL HISTORY/PAST SURGICAL HISTORY:  History reviewed. No pertinent past medical history.  MEDICATIONS:  Prior to Admission medications   Medication Sig Start Date End Date Taking? Authorizing Provider  ibuprofen (ADVIL,MOTRIN) 200 MG tablet Take 400 mg by mouth every 6 (six) hours as needed for moderate pain.   Yes Historical Provider,  MD  chlorpheniramine-HYDROcodone (TUSSIONEX PENNKINETIC ER) 10-8 MG/5ML LQCR Take 5 mLs by mouth every 12 (twelve) hours as needed. Patient not taking: Reported on 05/10/2015 06/29/14   Roselee Culver, MD  clarithromycin (BIAXIN) 500 MG tablet Take 1 tablet (500 mg total) by mouth 2 (two) times daily. Patient not taking: Reported on 05/10/2015 06/29/14   Roselee Culver, MD    ALLERGIES:  No Known Allergies  SOCIAL HISTORY:  Social History  Substance Use Topics  . Smoking status: Never Smoker   . Smokeless tobacco: Never Used  . Alcohol Use: No    FAMILY HISTORY: Family History  Problem Relation Age of Onset  . Cancer Mother   . Heart disease Father     EXAM: BP 196/106 mmHg  Pulse 51  Temp(Src) 98 F (36.7 C) (Oral)  Resp 18  SpO2 99% CONSTITUTIONAL: Alert and oriented and responds appropriately to questions. Well-appearing; well-nourished. smiling and appears comfortable. HEAD: Normocephalic EYES: Conjunctivae clear, PERRL ENT: normal nose; no rhinorrhea; moist mucous membranes; pharynx without lesions noted NECK: Supple, no meningismus, no LAD  CARD: RRR; S1 and S2 appreciated; no murmurs, no clicks, no rubs, no gallops RESP: Normal chest excursion without splinting or tachypnea; breath sounds clear and equal bilaterally; no wheezes, no rhonchi, no rales, no hypoxia or respiratory distress, speaking full sentences ABD/GI: Normal bowel sounds; non-distended; soft, mildly tender to palpation diffusely, no rebound, no guarding, no peritoneal signs. No tympany. No fluid wave. BACK:  The back appears normal and is non-tender to palpation, there is no CVA tenderness EXT: Normal ROM in all joints; non-tender  to palpation; no edema; normal capillary refill; no cyanosis, no calf tenderness or swelling    SKIN: Normal color for age and race; warm NEURO: Moves all extremities equally, sensation to light touch intact diffusely, cranial nerves II through XII intact PSYCH: The  patient's mood and manner are appropriate. Grooming and personal hygiene are appropriate.  MEDICAL DECISION MAKING: Patient here with diffuse abdominal pain. Reports feels better after bowel movement. Suspect he is constipated. Abdominal labs, urine are unremarkable other than mildly elevated creatinine which appears to be his baseline. Acute abdominal series shows no free air, bowel obstruction but a small to moderate stool burden. He is not tender at McBurney's point, negative Murphy sign. Nonsurgical abdomen. I feel he is safe to be discharged home with supportive care instructions including increasing water and fiber intake, prescriptions for MiraLAX and Colace. I do not feel he needs further emergent workup. Doubt appendicitis, cholecystitis, colitis, IBD.  Discussed return precautions. He verbalizes understanding and is comfortable with this plan.   I personally performed the services described in this documentation, which was scribed in my presence. The recorded information has been reviewed and is accurate.   [place], DO 05/10/15 [phone]

## 2015-05-10 NOTE — Discharge Instructions (Signed)
You need to follow up with a primary care doctor to manage your elevated blood pressure.  Please make an appointment ASAP.   Constipation, Adult Constipation is when a person has fewer than three bowel movements a week, has difficulty having a bowel movement, or has stools that are dry, hard, or larger than normal. As people grow older, constipation is more common. A low-fiber diet, not taking in enough fluids, and taking certain medicines may make constipation worse.  CAUSES   Certain medicines, such as antidepressants, pain medicine, iron supplements, antacids, and water pills.   Certain diseases, such as diabetes, irritable bowel syndrome (IBS), thyroid disease, or depression.   Not drinking enough water.   Not eating enough fiber-rich foods.   Stress or travel.   Lack of physical activity or exercise.   Ignoring the urge to have a bowel movement.   Using laxatives too much.  SIGNS AND SYMPTOMS   Having fewer than three bowel movements a week.   Straining to have a bowel movement.   Having stools that are hard, dry, or larger than normal.   Feeling full or bloated.   Pain in the lower abdomen.   Not feeling relief after having a bowel movement.  DIAGNOSIS  Your health care provider will take a medical history and perform a physical exam. Further testing may be done for severe constipation. Some tests may include:  A barium enema X-ray to examine your rectum, colon, and, sometimes, your small intestine.   A sigmoidoscopy to examine your lower colon.   A colonoscopy to examine your entire colon. TREATMENT  Treatment will depend on the severity of your constipation and what is causing it. Some dietary treatments include drinking more fluids and eating more fiber-rich foods. Lifestyle treatments may include regular exercise. If these diet and lifestyle recommendations do not help, your health care provider may recommend taking over-the-counter laxative  medicines to help you have bowel movements. Prescription medicines may be prescribed if over-the-counter medicines do not work.  HOME CARE INSTRUCTIONS   Eat foods that have a lot of fiber, such as fruits, vegetables, whole grains, and beans.  Limit foods high in fat and processed sugars, such as french fries, hamburgers, cookies, candies, and soda.   A fiber supplement may be added to your diet if you cannot get enough fiber from foods.   Drink enough fluids to keep your urine clear or pale yellow.   Exercise regularly or as directed by your health care provider.   Go to the restroom when you have the urge to go. Do not hold it.   Only take over-the-counter or prescription medicines as directed by your health care provider. Do not take other medicines for constipation without talking to your health care provider first.  [place] IF:   You have bright red blood in your stool.   Your constipation lasts for more than 4 days or gets worse.   You have abdominal or rectal pain.   You have thin, pencil-like stools.   You have unexplained weight loss. MAKE SURE YOU:   Understand these instructions.  Will watch your condition.  Will get help right away if you are not doing well or get worse.   This information is not intended to replace advice given to you by your health care provider. Make sure you discuss any questions you have with your health care provider.   Document Released: 03/05/2004 Document Revised: 06/28/2014 Document Reviewed: 03/19/2013 Elsevier Interactive Patient Education  2016 Elsevier Inc.  High-Fiber Diet Fiber, also called dietary fiber, is a type of carbohydrate found in fruits, vegetables, whole grains, and beans. A high-fiber diet can have many health benefits. Your health care provider may recommend a high-fiber diet to help:  Prevent constipation. Fiber can make your bowel movements more regular.  Lower your  cholesterol.  Relieve hemorrhoids, uncomplicated diverticulosis, or irritable bowel syndrome.  Prevent overeating as part of a weight-loss plan.  Prevent heart disease, type 2 diabetes, and certain cancers. WHAT IS MY PLAN? The recommended daily intake of fiber includes:  38 grams for men under age 102.  59 grams for men over age 27.  65 grams for women under age 79.  26 grams for women over age 49. You can get the recommended daily intake of dietary fiber by eating a variety of fruits, vegetables, grains, and beans. Your health care provider may also recommend a fiber supplement if it is not possible to get enough fiber through your diet. WHAT DO I NEED TO KNOW ABOUT A HIGH-FIBER DIET?  Fiber supplements have not been widely studied for their effectiveness, so it is better to get fiber through food sources.  Always check the fiber content on thenutrition facts label of any prepackaged food. Look for foods that contain at least 5 grams of fiber per serving.  Ask your dietitian if you have questions about specific foods that are related to your condition, especially if those foods are not listed in the following section.  Increase your daily fiber consumption gradually. Increasing your intake of dietary fiber too quickly may cause bloating, cramping, or gas.  Drink plenty of water. Water helps you to digest fiber. WHAT FOODS CAN I EAT? Grains Whole-grain breads. Multigrain cereal. Oats and oatmeal. Brown rice. Barley. Bulgur wheat. [place]. Bran muffins. Popcorn. Rye wafer crackers. Vegetables Sweet potatoes. Spinach. Kale. Artichokes. Cabbage. Broccoli. Green peas. Carrots. Squash. Fruits Berries. Pears. Apples. Oranges. Avocados. Prunes and raisins. Dried figs. Meats and Other Protein Sources Navy, kidney, pinto, and soy beans. Split peas. Lentils. Nuts and seeds. Dairy Fiber-fortified yogurt. Beverages Fiber-fortified soy milk. Fiber-fortified orange juice. Other Fiber  bars. The items listed above may not be a complete list of recommended foods or beverages. Contact your dietitian for more options. WHAT FOODS ARE NOT RECOMMENDED? Grains White bread. Pasta made with refined flour. White rice. Vegetables Fried potatoes. Canned vegetables. Well-cooked vegetables.  Fruits Fruit juice. Cooked, strained fruit. Meats and Other Protein Sources Fatty cuts of meat. Fried Sales executive or fried fish. Dairy Milk. Yogurt. Cream cheese. Sour cream. Beverages Soft drinks. Other Cakes and pastries. Butter and oils. The items listed above may not be a complete list of foods and beverages to avoid. Contact your dietitian for more information. WHAT ARE SOME TIPS FOR INCLUDING HIGH-FIBER FOODS IN MY DIET?  Eat a wide variety of high-fiber foods.  Make sure that half of all grains consumed each day are whole grains.  Replace breads and cereals made from refined flour or white flour with whole-grain breads and cereals.  Replace white rice with brown rice, bulgur wheat, or millet.  Start the day with a breakfast that is high in fiber, such as a cereal that contains at least 5 grams of fiber per serving.  Use beans in place of meat in soups, salads, or pasta.  Eat high-fiber snacks, such as berries, raw vegetables, nuts, or popcorn.   This information is not intended to replace advice given to you by your health care  provider. Make sure you discuss any questions you have with your health care provider.   Document Released: 06/07/2005 Document Revised: 06/28/2014 Document Reviewed: 11/20/2013 Elsevier Interactive Patient Education 2016 [employer].  Hypertension Hypertension is another name for high blood pressure. High blood pressure forces your heart to work harder to pump blood. A blood pressure reading has two numbers, which includes a higher number over a lower number (example: 110/72). HOME CARE   Have your blood pressure rechecked by your doctor.  Only take  medicine as told by your doctor. Follow the directions carefully. The medicine does not work as well if you skip doses. Skipping doses also puts you at risk for problems.  Do not smoke.  Monitor your blood pressure at home as told by your doctor. GET HELP IF:  You think you are having a reaction to the medicine you are taking.  You have repeat headaches or feel dizzy.  You have puffiness (swelling) in your ankles.  You have trouble with your vision. GET HELP RIGHT AWAY IF:   You get a very bad headache and are confused.  You feel weak, numb, or faint.  You get chest or belly (abdominal) pain.  You throw up (vomit).  You cannot breathe very well. MAKE SURE YOU:   Understand these instructions.  Will watch your condition.  Will get help right away if you are not doing well or get worse.   This information is not intended to replace advice given to you by your health care provider. Make sure you discuss any questions you have with your health care provider.   Document Released: 11/24/2007 Document Revised: 06/12/2013 Document Reviewed: 03/30/2013 Elsevier Interactive Patient Education 2016 [employer].     Emergency Department Resource Guide 1) Find a Doctor and Pay Out of Pocket Although you won't have to find out who is covered by your insurance plan, it is a good idea to ask around and get recommendations. You will then need to call the office and see if the doctor you have chosen will accept you as a new patient and what types of options they offer for patients who are self-pay. Some doctors offer discounts or will set up payment plans for their patients who do not have insurance, but you will need to ask so you aren't surprised when you get to your appointment.  2) Contact Your Local Health Department Not all health departments have doctors that can see patients for sick visits, but many do, so it is worth a call to see if yours does. If you don't know where your  local health department is, you can check in your phone book. The CDC also has a tool to help you locate your state's health department, and many state websites also have listings of all of their local health departments.  3) Find a [facility] If your illness is not likely to be very severe or complicated, you may want to try a walk in clinic. These are popping up all over the country in pharmacies, drugstores, and shopping centers. They're usually staffed by nurse practitioners or physician assistants that have been trained to treat common illnesses and complaints. They're usually fairly quick and inexpensive. However, if you have serious medical issues or chronic medical problems, these are probably not your best option.  No Primary Care Doctor: - Call Health Connect at  [phone] - they can help you locate a primary care doctor that  accepts your insurance, provides certain services, etc. -  Physician Referral Service- [phone]  Chronic Pain Problems: Organization         Address  Phone   Notes  [facility]  [phone] Patients need to be referred by their primary care doctor.   Medication Assistance: Organization         Address  Phone   Notes  [facility] Medication [facility] [address] [place], [place] [address] [phone] --Must be a resident of [facility] -- Must have NO insurance coverage whatsoever (no Medicaid/ Medicare, etc.) -- The pt. MUST have a primary care doctor that directs their care regularly and follows them in the community   MedAssist  [phone]   [employer]  [phone]    Agencies that provide inexpensive medical care: Organization         Address  Phone   Notes  [place]  [phone]   [facility]    [phone]   [facility] [place],  [address] [phone]   [place]  [address] [place] [phone]   Planned Parenthood    [phone]   [facility]    [phone]   [place] and [place] [address], [place] Phone:  [phone], Fax:  [phone] Hours of Operation:  9 am - 6 pm, M-F.  Also accepts Medicaid/Medicare and self-pay.  [facility] for [place], [address], [place] Phone: [phone], Fax: [phone]. Hours of Operation:  8:30 am - 5:30 pm, M-F.  Also accepts Medicaid and self-pay.  [facility] [place] [address] [place] Phone: [phone]   [place], [place], [place] [phone], Ext. [phone] Mondays & Thursdays: 7-9 AM.  First 15 patients are seen on a first come, first serve basis.    [place] Providers:  Organization         Address  Phone   Notes  [facility] [address], [place] [phone] Also accepts self-pay patients.  [facility] [id number] [place], [place]  [phone]   [place], [address], [place] [phone]   [facility] [address] [place] [phone]   Lucianne Lei [address], [place]   [phone] Only accepts [place] Access [place] patients after they have their name applied to their card.   Self-Pay (no insurance) in [facility]:  Organization         Address  Phone   Notes  Sickle Cell Patients, [facility] [place] [phone]   [facility] Urgent Care [place] [phone]   [facility] [place]  [place], [place], [place] [phone]   [facility]/Dr. Osei-Bonsu  [address] [place] or [address], [place] [phone] Phone number for both [place] and [place] locations is the same.  Urgent Medical and [facility] [address], [place] [phone]   [facility] [address] [place] or [address] [phone]   [facility] [place], [place] [phone],  phone; [phone], fax Sees patients 1st and 3rd Saturday of every month.  Must not qualify for public or private insurance (i.e. Medicaid, Medicare, [place] Health Choice, Veterans' Benefits)  Household income should be no more than 200% of the poverty level The clinic cannot treat you if you are pregnant or think you are pregnant  Sexually transmitted diseases are not treated at the clinic.    Dental Care: Organization         Address  Phone  Notes  [facility] Department of [facility] [place] [phone] Accepts children up to age 65 who are enrolled in [place] or [place]; pregnant women with a Medicaid card; and children who have applied for Medicaid or  Health Choice, but were declined, whose parents can pay a reduced fee at time of service.  [facility] Department of [facility]  [address], [place] [phone] Accepts children up to age 32 who are enrolled in [place] or [place]; pregnant women with a Medicaid card; and children who have applied for Medicaid or  Health Choice, but were declined, whose parents can pay a reduced fee at time of service.  [place] Adult [facility] Access PROGRAM  [place] [phone] Patients are seen by appointment only. Walk-ins are not accepted. [place] will see patients 78 years of age and older. Monday - Tuesday (8am-5pm) Most Wednesdays (8:30-5pm) $30 per visit, cash only  [facility] Adult Dental Access PROGRAM  [address], [facility] [phone] Patients are seen by appointment only. Walk-ins are not  accepted. [place] will see patients 29 years of age and older. One Wednesday Evening (Monthly: Volunteer Based).  $30 per visit, cash only  [place]  [phone] for adults; Children under age 92, call Graduate Pediatric Dentistry at [phone]. Children aged 58-14, please call [phone] to request a pediatric application.  Dental services are provided in all areas of dental care including fillings, crowns and bridges, complete and partial dentures, implants, gum treatment, root canals, and extractions. Preventive care is also provided. Treatment is provided to both adults and children. Patients are selected via a lottery and there is often a waiting list.   [facility] [address] [place]  [phone] [email or web address]   [facility] [address] [place], [place] [phone], Ext. [phone] Second and Fourth Thursday of each month, opens at 6:30 AM; Clinic ends at 9 AM.  Patients are seen on a first-come first-served basis, and a limited number are seen during each clinic.   [facility]  [address] [place] Danker [place], [place] [phone]   Eligibility Requirements You must have lived in [place], [place], or [place] counties for at least the last three months.   You cannot be eligible for state or federal sponsored [employer], including [employer], [place], or [employer].   You generally cannot be eligible for healthcare insurance through your employer.    How to apply: Eligibility screenings are held every Tuesday and Wednesday afternoon from 1:00 pm until 4:00 pm. You do not need an appointment for the interview!  [facility] [address], [place], [place]   [place]  [place] Department  [place]  [phone]    Behavioral Health Resources in the  Community: Intensive Outpatient Dispensing optician         Address  Phone  Notes  [place]. [address] [place], [place] [phone]   [facility]patient [address] [place], [place]   ADS: Alcohol & Drug Svcs [address], [place], [place]   [place] [address]  [place], [place] or [phone]   Substance Abuse Resources Organization         Address  Phone  Notes  Alcohol and Drug Services  [phone]   [place]  [phone]   The [facility]   Chinita Pester  [phone]   Residential & Outpatient Substance Abuse Program  [phone]   Psychological Services Organization         Address  Phone  Notes  [facility]  [place]  [phone]   [place] [address], [place] or [phone]    Mobile Crisis Teams Organization         Address  Phone  Notes  [facility], Mobile Crisis Care Unit  [phone]   [facility] Psychotherapeutic Services  [address] [place], [place]   [place] [address], [place] [phone]    Self-Help/Support Groups Organization         Address  Phone             Notes  [place]. of [place] - variety of support groups  [place] Call for more information  Narcotics Anonymous (NA), [facility] [address], [employer] [place]  2 meetings at this location   Special educational needs teacher         Address  Phone  Notes  ASAP Residential Treatment [place],    [place]  [phone]   [facility]  [address], [place] [id number], [place], [place]   [place], [place] [phone] Admissions: 8am-3pm M-F  Incentives Substance [place] [address]    [place], [place] [phone]   [facility] [address] [place], [place], [place]   [facility] [address]  [place], [place]   Insight Programs - Intensive Outpatient [address]een [address], [place], [place]   [facility] ([place].) [place].,  [place], [place] [phone] or [phone]   [facility] Services (RTS) [address] [place], [place] Accepts Medicaid  [facility] [place] [address],  [place] [phone] Substance Abuse/Addiction Treatment   [facility] Organization         Address  Phone  Notes  [facility]  [phone]   Domenic Schwab, PhD [address] [place], [place]   [phone] or [phone]   [place], [place] [phone]   [place] [address] [place], [place] [phone] Insurance/Medicaid/sponsorship through [employer] and Families [address], [place]                                    [place], [place] [phone] [place] [address], [place] [phone]  Dr. Adele Schilder  [phone]   [facility] Dept. 1) [address], [place] 2) [place] 3)  [address], [place] [phone]  [phone]   [place] [phone] or [phone] (After Hours)

## 2015-05-10 NOTE — ED Notes (Signed)
EDP at pt bedside

## 2015-07-10 ENCOUNTER — Encounter: Payer: Self-pay | Admitting: Internal Medicine

## 2015-07-10 ENCOUNTER — Ambulatory Visit (INDEPENDENT_AMBULATORY_CARE_PROVIDER_SITE_OTHER): Payer: Medicare Other | Admitting: Internal Medicine

## 2015-07-10 VITALS — BP 184/102 | HR 55 | Temp 97.8°F | Resp 16 | Ht 73.0 in | Wt 237.0 lb

## 2015-07-10 DIAGNOSIS — Z23 Encounter for immunization: Secondary | ICD-10-CM

## 2015-07-10 DIAGNOSIS — H548 Legal blindness, as defined in USA: Secondary | ICD-10-CM | POA: Insufficient documentation

## 2015-07-10 DIAGNOSIS — Z1211 Encounter for screening for malignant neoplasm of colon: Secondary | ICD-10-CM | POA: Diagnosis not present

## 2015-07-10 DIAGNOSIS — IMO0001 Reserved for inherently not codable concepts without codable children: Secondary | ICD-10-CM | POA: Insufficient documentation

## 2015-07-10 DIAGNOSIS — R030 Elevated blood-pressure reading, without diagnosis of hypertension: Secondary | ICD-10-CM

## 2015-07-10 DIAGNOSIS — Z139 Encounter for screening, unspecified: Secondary | ICD-10-CM

## 2015-07-10 MED ORDER — AMLODIPINE BESYLATE 5 MG PO TABS: 5.0000 mg | ORAL_TABLET | Freq: Every day | ORAL | Status: DC

## 2015-07-10 NOTE — Progress Notes (Signed)
Pre visit review using our clinic review tool, if applicable. No additional management support is needed unless otherwise documented below in the visit note. 

## 2015-07-10 NOTE — Assessment & Plan Note (Signed)
Probable hypertension Will start amlodipine 5 mg daily Asked him to ideally monitor if he is able Stressed low-sodium diet-eats a lot of fast food during the day Stressed the importance of regular exercise Work on weight loss Check CMP Follow-up in 3 weeks

## 2015-07-10 NOTE — Patient Instructions (Addendum)
  We have reviewed your prior records including labs and tests today.  Test(s) ordered today. Your results will be released to [place] (or called to you) after review, usually within 72hours after test completion. If any changes need to be made, you will be notified at that same time.  All other Health Maintenance issues reviewed.   Tetanus administered today.   Medications reviewed and updated.  Changes include  /  No changes recommended at this time.  Your prescription(s) have been submitted to your pharmacy. Please take as directed and contact our office if you believe you are having problem(s) with the medication(s).   Please schedule followup in 3 weeks

## 2015-07-10 NOTE — Progress Notes (Signed)
Subjective:    Patient ID: [name] [name], male    DOB: February 11, 1951, 65 y.o.   MRN: [id number]  HPI He is here to establish with a new pcp.  He has not had a pcp for a long time and has had elevated BP recently.    Elevated blood pressure/hypertension:  He has never been on medication for his blood pressure.  His BP was controlled up until recently.  He is not compliant with a low sodium diet.  He is active, but does not exercise.  He denies chest pain, palpitations, edema, headaches and sob.    Kidney problems: Prior to work shows that his kidney function was reduced. He states he was told, the patient did not make changes he may have dialysis. He is unsure if he has problems are not.  He went to the emergency room not long ago for severe constipation. This was corrected with eating more vegetables. He has had a colonoscopy, but has been more than 10 years. He knows he needs to have another one.    Medications and allergies reviewed with patient and updated if appropriate.  Patient Active Problem List   Diagnosis Date Noted  . Elevated blood pressure 07/10/2015  . Legally blind in right eye, as defined in [place] 07/10/2015    No current outpatient prescriptions on file prior to visit.   No current facility-administered medications on file prior to visit.    Past Medical History  Diagnosis Date  . Hypertension     Past Surgical History  Procedure Laterality Date  . No past surgeries      Social History   Social History  . Marital Status: Married    Spouse Name: N/A  . Number of Children: N/A  . Years of Education: N/A   Social History Main Topics  . Smoking status: Never Smoker   . Smokeless tobacco: Never Used  . Alcohol Use: No  . Drug Use: No  . Sexual Activity: Not Asked   Other Topics Concern  . None   Social History Narrative    Family History  Problem Relation Age of Onset  . Cancer Mother   . Heart disease Father   . Hypertension Father      Review of Systems  Constitutional: Negative for fever and chills.  HENT: Negative for hearing loss.   Eyes: Positive for visual disturbance (blind in right eye).  Respiratory: Negative for cough, shortness of breath and wheezing.   Cardiovascular: Negative for chest pain, palpitations and leg swelling.  Gastrointestinal: Negative for nausea, abdominal pain, diarrhea, constipation and blood in stool.       No GERD  Endocrine: Negative for polydipsia and polyuria.  Genitourinary: Negative for dysuria and hematuria.  Musculoskeletal: Negative for myalgias, back pain and arthralgias.  Neurological: Negative for dizziness, weakness, light-headedness, numbness and headaches.  Psychiatric/Behavioral: Negative for dysphoric mood. The patient is not nervous/anxious.        Objective:   Filed Vitals:   07/10/15 1004  BP: 184/102  Pulse: 55  Temp: 97.8 F (36.6 C)  Resp: 16   Filed Weights   07/10/15 1004  Weight: 237 lb (107.502 kg)   Body mass index is 31.27 kg/(m^2).   Physical Exam  Constitutional: He appears well-developed and well-nourished. No distress.  HENT:  Head: Normocephalic and atraumatic.  Right Ear: External ear normal.  Left Ear: External ear normal.  Mouth/Throat: Oropharynx is clear and moist.  Normal ear canals and tympanic membranes bilaterally,  poor dentition  Eyes: Conjunctivae are normal.  Neck: Neck supple. No tracheal deviation present.  No carotid bruit  Cardiovascular: Normal rate, regular rhythm and normal heart sounds.   No murmur heard. Pulmonary/Chest: Effort normal and breath sounds normal. No respiratory distress. He has no wheezes. He has no rales.  Abdominal: Soft. He exhibits no distension. There is no tenderness.  Musculoskeletal: He exhibits no edema.  Lymphadenopathy:    He has no cervical adenopathy.  Neurological: He is alert.  Skin: Skin is warm and dry. He is not diaphoretic.  Psychiatric: He has a normal mood and affect. His  behavior is normal.          Assessment & Plan:    See Problem List for Assessment and Plan of chronic medical problems.  Screening blood work ordered Tetanus today Referral ordered for GI for repeat colonoscopy  Follow-up in 3 weeks

## 2015-07-31 ENCOUNTER — Ambulatory Visit (INDEPENDENT_AMBULATORY_CARE_PROVIDER_SITE_OTHER): Payer: Medicare Other | Admitting: Internal Medicine

## 2015-07-31 ENCOUNTER — Other Ambulatory Visit (INDEPENDENT_AMBULATORY_CARE_PROVIDER_SITE_OTHER): Payer: Medicare Other

## 2015-07-31 ENCOUNTER — Encounter: Payer: Self-pay | Admitting: Internal Medicine

## 2015-07-31 VITALS — BP 142/86 | HR 50 | Temp 97.7°F | Resp 16 | Wt 235.0 lb

## 2015-07-31 DIAGNOSIS — IMO0001 Reserved for inherently not codable concepts without codable children: Secondary | ICD-10-CM

## 2015-07-31 DIAGNOSIS — R030 Elevated blood-pressure reading, without diagnosis of hypertension: Secondary | ICD-10-CM | POA: Diagnosis not present

## 2015-07-31 DIAGNOSIS — Z139 Encounter for screening, unspecified: Secondary | ICD-10-CM | POA: Diagnosis not present

## 2015-07-31 DIAGNOSIS — N189 Chronic kidney disease, unspecified: Secondary | ICD-10-CM

## 2015-07-31 DIAGNOSIS — N183 Chronic kidney disease, stage 3 unspecified: Secondary | ICD-10-CM | POA: Insufficient documentation

## 2015-07-31 LAB — COMPREHENSIVE METABOLIC PANEL
ALBUMIN: 4.3 g/dL (ref 3.5–5.2)
ALK PHOS: 65 U/L (ref 39–117)
ALT: 34 U/L (ref 0–53)
AST: 36 U/L (ref 0–37)
BILIRUBIN TOTAL: 1 mg/dL (ref 0.2–1.2)
BUN: 23 mg/dL (ref 6–23)
CALCIUM: 9.3 mg/dL (ref 8.4–10.5)
CO2: 31 mEq/L (ref 19–32)
CREATININE: 1.44 mg/dL (ref 0.40–1.50)
Chloride: 104 mEq/L (ref 96–112)
GFR: 63.9 mL/min (ref >60.00)
Glucose, Bld: 97 mg/dL (ref 70–99)
Potassium: 3.6 mEq/L (ref 3.5–5.1)
Sodium: 139 mEq/L (ref 135–145)
Total Protein: 7.3 g/dL (ref 6.0–8.3)

## 2015-07-31 LAB — LIPID PANEL
CHOLESTEROL: 254 mg/dL — AB (ref 0–200)
HDL: 35.6 mg/dL — ABNORMAL LOW (ref >39.00)
LDL Cholesterol: 185 mg/dL — ABNORMAL HIGH (ref 0–99)
NonHDL: 218.08
Total CHOL/HDL Ratio: 7
Triglycerides: 166 mg/dL — ABNORMAL HIGH (ref 0.0–149.0)
VLDL: 33.2 mg/dL (ref 0.0–40.0)

## 2015-07-31 LAB — CBC WITH DIFFERENTIAL/PLATELET
BASOS ABS: 0 10*3/uL (ref 0.0–0.1)
Basophils Relative: 0.6 % (ref 0.0–3.0)
EOS ABS: 0.1 10*3/uL (ref 0.0–0.7)
Eosinophils Relative: 1.2 % (ref 0.0–5.0)
HEMATOCRIT: 43.5 % (ref 39.0–52.0)
HEMOGLOBIN: 14.7 g/dL (ref 13.0–17.0)
LYMPHS ABS: 1.6 10*3/uL (ref 0.7–4.0)
Lymphocytes Relative: 37 % (ref 12.0–46.0)
MCHC: 33.8 g/dL (ref 30.0–36.0)
MCV: 92 fl (ref 78.0–100.0)
Monocytes Absolute: 0.4 10*3/uL (ref 0.1–1.0)
Monocytes Relative: 9 % (ref 3.0–12.0)
NEUTROS ABS: 2.2 10*3/uL (ref 1.4–7.7)
Neutrophils Relative %: 52.2 % (ref 43.0–77.0)
Platelets: 117 10*3/uL — ABNORMAL LOW (ref 150.0–400.0)
RBC: 4.73 Mil/uL (ref 4.22–5.81)
RDW: 13.2 % (ref 11.5–15.5)
WBC: 4.2 10*3/uL (ref 4.0–10.5)

## 2015-07-31 LAB — TSH: TSH: 1.35 u[IU]/mL (ref 0.35–4.50)

## 2015-07-31 LAB — HEMOGLOBIN A1C: HEMOGLOBIN A1C: 5.5 % (ref 4.6–6.5)

## 2015-07-31 MED ORDER — IRBESARTAN 75 MG PO TABS: 75.0000 mg | ORAL_TABLET | Freq: Every day | ORAL | Status: DC

## 2015-07-31 NOTE — Assessment & Plan Note (Addendum)
BP still slightly high Reviewed potential consequences of uncontrolled BP Continue amlodipine 5 mg daily - will not increase due to low HR  Add avapro 75 mg daily - call if side effects Check blood work today Follow up in 3 months

## 2015-07-31 NOTE — Patient Instructions (Addendum)
  We have reviewed your prior records including labs and tests today.  Test(s) ordered today. Your results will be released to [facility] (or called to you) after review, usually within 72hours after test completion. If any changes need to be made, you will be notified at that same time.  All other Health Maintenance issues reviewed.   All recommended immunizations and age-appropriate screenings are up-to-date/discussed.  No immunizations administered today.   Medications reviewed and updated.  Changes include adding another blood pressure medication called avapro.  Your prescription(s) have been submitted to your pharmacy. Please take as directed and contact our office if you believe you are having problem(s) with the medication(s).   Please schedule followup in 3 months

## 2015-07-31 NOTE — Assessment & Plan Note (Signed)
Recheck kidney function Low sodium diet Continue increased water intake Avoid nsaids Stressed good BP control

## 2015-07-31 NOTE — Progress Notes (Signed)
Pre visit review using our clinic review tool, if applicable. No additional management support is needed unless otherwise documented below in the visit note. 

## 2015-07-31 NOTE — Progress Notes (Signed)
    Subjective:    Patient ID: [name] [name], male    DOB: 1950/09/30, 65 y.o.   MRN: [id number]  HPI He is here for follow up.   Hypertension: He is taking his medication daily. He denies any side effects.  He is compliant with a low sodium diet.  He denies chest pain, palpitations, edema, shortness of breath and regular headaches. He is not exercising regularly.  He does not monitor his blood pressure at home.    Chronic kidney function:  He drinks lots of water throughout the day - his wife disagrees.  He does not take any advil or aleve.      Medications and allergies reviewed with patient and updated if appropriate.  Patient Active Problem List   Diagnosis Date Noted  . Elevated blood pressure 07/10/2015  . Legally blind in right eye, as defined in [place] 07/10/2015    Current Outpatient Prescriptions on File Prior to Visit  Medication Sig Dispense Refill  . amLODipine (NORVASC) 5 MG tablet Take 1 tablet (5 mg total) by mouth daily. 90 tablet 1   No current facility-administered medications on file prior to visit.    Past Medical History  Diagnosis Date  . Hypertension     Past Surgical History  Procedure Laterality Date  . No past surgeries      Social History   Social History  . Marital Status: Married    Spouse Name: N/A  . Number of Children: N/A  . Years of Education: N/A   Social History Main Topics  . Smoking status: Never Smoker   . Smokeless tobacco: Never Used  . Alcohol Use: No  . Drug Use: No  . Sexual Activity: Not Asked   Other Topics Concern  . None   Social History Narrative    Family History  Problem Relation Age of Onset  . Cancer Mother   . Heart disease Father   . Hypertension Father     Review of Systems  Constitutional: Negative for fever.  Respiratory: Negative for cough, shortness of breath and wheezing.   Cardiovascular: Negative for chest pain, palpitations and leg swelling.  Gastrointestinal: Negative for  constipation.  Neurological: Negative for dizziness, light-headedness and headaches.       Objective:   Filed Vitals:   07/31/15 0822  BP: 142/86  Pulse: 50  Temp: 97.7 F (36.5 C)  Resp: 16   Filed Weights   07/31/15 0822  Weight: 235 lb (106.595 kg)   Body mass index is 31.01 kg/(m^2).   Physical Exam Constitutional: Appears well-developed and well-nourished. No distress.  Neck: Neck supple. No tracheal deviation present. No thyromegaly present.  No carotid bruit. No cervical adenopathy.   Cardiovascular: Normal rate, regular rhythm and normal heart sounds.   No murmur heard.  No edema Pulmonary/Chest: Effort normal and breath sounds normal. No respiratory distress. No wheezes.       Assessment & Plan:   See Problem List for Assessment and Plan of chronic medical problems.   Follow up in 3 months

## 2015-08-01 ENCOUNTER — Encounter: Payer: Self-pay | Admitting: Gastroenterology

## 2015-08-01 LAB — PSA, TOTAL AND FREE
PSA FREE PCT: 8 % — AB (ref >25)
PSA FREE: 1.62 ng/mL
PSA: 20.08 ng/mL — ABNORMAL HIGH (ref <=4.00)

## 2015-08-02 ENCOUNTER — Other Ambulatory Visit: Payer: Self-pay | Admitting: Internal Medicine

## 2015-08-02 ENCOUNTER — Encounter: Payer: Self-pay | Admitting: Internal Medicine

## 2015-08-02 DIAGNOSIS — R972 Elevated prostate specific antigen [PSA]: Secondary | ICD-10-CM | POA: Insufficient documentation

## 2015-08-02 DIAGNOSIS — E785 Hyperlipidemia, unspecified: Secondary | ICD-10-CM | POA: Insufficient documentation

## 2015-08-05 ENCOUNTER — Encounter: Payer: Self-pay | Admitting: Internal Medicine

## 2015-08-08 ENCOUNTER — Encounter: Payer: Self-pay | Admitting: Nurse Practitioner

## 2015-08-08 ENCOUNTER — Ambulatory Visit (INDEPENDENT_AMBULATORY_CARE_PROVIDER_SITE_OTHER): Payer: Medicare Other | Admitting: Nurse Practitioner

## 2015-08-08 VITALS — BP 116/82 | HR 72 | Temp 98.8°F | Resp 16 | Wt 233.0 lb

## 2015-08-08 DIAGNOSIS — B9789 Other viral agents as the cause of diseases classified elsewhere: Principal | ICD-10-CM

## 2015-08-08 DIAGNOSIS — J069 Acute upper respiratory infection, unspecified: Secondary | ICD-10-CM | POA: Diagnosis not present

## 2015-08-08 MED ORDER — FLUTICASONE PROPIONATE 50 MCG/ACT NA SUSP: 2.0000 | Freq: Every day | NASAL | Status: DC

## 2015-08-08 NOTE — Patient Instructions (Signed)
Benadryl over the counter 1-2 tablets at night for drainage and coughing  Robitussin- continue this over the counter for the cough   Flonase- 2 sprays in each nostril once daily   Lots of rest and water! This will take a week or so to pass.

## 2015-08-08 NOTE — Assessment & Plan Note (Signed)
New onset Will treat conservatively due to probable viral nature Flonase was sent to pharmacy  Robitussin and benadryl at night encouraged  FU prn worsening/failure to improve.

## 2015-08-08 NOTE — Progress Notes (Signed)
Patient ID: [name] [name], male    DOB: 1950-10-16  Age: 65 y.o. MRN: [id number]  CC: Cough   HPI [name] presents for CC of cough x 1 day.   1) Fever 99.9 last night and 100.9 this morning per wife Cough is dry  Denies any other viral symptoms  Denies sick contacts   Treatment [place] has a past medical history of Hypertension.   He has past surgical history that includes No past surgeries.   His family history includes Cancer in his mother; Heart disease in his father; Hypertension in his father.He reports that he has never smoked. He has never used smokeless tobacco. He reports that he does not drink alcohol or use illicit drugs.  Outpatient Prescriptions Prior to Visit  Medication Sig Dispense Refill  . amLODipine (NORVASC) 5 MG tablet Take 1 tablet (5 mg total) by mouth daily. 90 tablet 1  . irbesartan (AVAPRO) 75 MG tablet Take 1 tablet (75 mg total) by mouth daily. 30 tablet 5   No facility-administered medications prior to visit.    ROS Review of Systems  Constitutional: Positive for fever, chills and fatigue. Negative for diaphoresis.  HENT: Positive for congestion and sneezing. Negative for postnasal drip, rhinorrhea, sinus pressure and sore throat.   Eyes: Negative for visual disturbance.  Respiratory: Positive for cough. Negative for chest tightness, shortness of breath and wheezing.   Cardiovascular: Negative for chest pain, palpitations and leg swelling.  Neurological: Positive for light-headedness. Negative for dizziness.    Objective:  BP 116/82 mmHg  Pulse 72  Temp(Src) 98.8 F (37.1 C) (Oral)  Resp 16  Wt 233 lb (105.688 kg)  SpO2 98%  Physical Exam  Constitutional: He is oriented to person, place, and time. He appears well-developed and well-nourished. No distress.  HENT:  Head: Normocephalic and atraumatic.  Right Ear: External ear normal.  Left Ear: External ear normal.  Mouth/Throat: Oropharynx is  clear and moist.  TM's clear bilaterally  Eyes: Right eye exhibits no discharge. Left eye exhibits no discharge. No scleral icterus.  Blind in right eye- EOM of left eye normal   Neck: Normal range of motion. Neck supple.  Cardiovascular: Normal rate, regular rhythm and normal heart sounds.  Exam reveals no gallop and no friction rub.   No murmur heard. Pulmonary/Chest: Effort normal and breath sounds normal. No respiratory distress. He has no wheezes. He has no rales. He exhibits no tenderness.  Lymphadenopathy:    He has no cervical adenopathy.  Neurological: He is alert and oriented to person, place, and time.  Skin: Skin is warm and dry. No rash noted. He is not diaphoretic.  Psychiatric: He has a normal mood and affect. His behavior is normal. Judgment and thought content normal.   Assessment & Plan:   [name] [name] was seen today for cough.  Diagnoses and all orders for this visit:  Viral URI with cough  Other orders -     fluticasone (FLONASE) 50 MCG/ACT nasal spray; Place 2 sprays into both nostrils daily.  I am having Mr. Otterness start on fluticasone. I am also having him maintain his amLODipine and irbesartan.  Meds ordered this encounter  Medications  . fluticasone (FLONASE) 50 MCG/ACT nasal spray    Sig: Place 2 sprays into both nostrils daily.    Dispense:  16 g    Refill:  6    Order Specific Question:  Supervising Provider    Answer:  Deborra Medina  L [[id number]]     Follow-up: Return if symptoms worsen or fail to improve.

## 2015-08-08 NOTE — Progress Notes (Signed)
Pre visit review using our clinic review tool, if applicable. No additional management support is needed unless otherwise documented below in the visit note. 

## 2015-08-12 ENCOUNTER — Ambulatory Visit (INDEPENDENT_AMBULATORY_CARE_PROVIDER_SITE_OTHER): Payer: Medicare Other | Admitting: Internal Medicine

## 2015-08-12 ENCOUNTER — Encounter: Payer: Self-pay | Admitting: Internal Medicine

## 2015-08-12 ENCOUNTER — Ambulatory Visit: Payer: Medicare Other | Admitting: Internal Medicine

## 2015-08-12 VITALS — BP 120/72 | HR 57 | Temp 98.9°F | Resp 20 | Wt 232.0 lb

## 2015-08-12 DIAGNOSIS — R030 Elevated blood-pressure reading, without diagnosis of hypertension: Secondary | ICD-10-CM

## 2015-08-12 DIAGNOSIS — IMO0001 Reserved for inherently not codable concepts without codable children: Secondary | ICD-10-CM

## 2015-08-12 DIAGNOSIS — J069 Acute upper respiratory infection, unspecified: Secondary | ICD-10-CM

## 2015-08-12 MED ORDER — HYDROCODONE-HOMATROPINE 5-1.5 MG/5ML PO SYRP: 5.0000 mL | ORAL_SOLUTION | Freq: Four times a day (QID) | ORAL | Status: DC | PRN

## 2015-08-12 MED ORDER — LEVOFLOXACIN 250 MG PO TABS: 250.0000 mg | ORAL_TABLET | Freq: Every day | ORAL | Status: DC

## 2015-08-12 NOTE — Patient Instructions (Signed)
Please take all new medication as prescribed - the antibiotic, and cough medicine if needed  Please continue all other medications as before, and refills have been done if requested.  Please have the pharmacy call with any other refills you may need.  Please keep your appointments with your specialists as you may have planned     

## 2015-08-12 NOTE — Progress Notes (Signed)
Pre visit review using our clinic review tool, if applicable. No additional management support is needed unless otherwise documented below in the visit note. 

## 2015-08-13 NOTE — Assessment & Plan Note (Signed)
stable overall by history and exam, recent data reviewed with pt, and pt to continue medical treatment as before,  to f/u any worsening symptoms or concerns BP Readings from Last 3 Encounters:  08/12/15 120/72  08/08/15 116/82  07/31/15 142/86

## 2015-08-13 NOTE — Assessment & Plan Note (Signed)
Mild to mod, for antibx course,  to f/u any worsening symptoms or concerns 

## 2015-08-13 NOTE — Progress Notes (Signed)
   Subjective:    Patient ID: [name] [name], male    DOB: 1951-04-12, 65 y.o.   MRN: [id number]  HPI   Here with 2-3 days acute onset fever, facial pain, pressure, headache, general weakness and malaise, and greenish d/c, with mild ST and cough, but pt denies chest pain, wheezing, increased sob or doe, orthopnea, PND, increased LE swelling, palpitations, dizziness or syncope. Pt denies new neurological symptoms such as new headache, or facial or extremity weakness or numbness Past Medical History  Diagnosis Date  . Hypertension    Past Surgical History  Procedure Laterality Date  . No past surgeries      reports that he has never smoked. He has never used smokeless tobacco. He reports that he does not drink alcohol or use illicit drugs. family history includes Cancer in his mother; Heart disease in his father; Hypertension in his father. No Known Allergies Current Outpatient Prescriptions on File Prior to Visit  Medication Sig Dispense Refill  . amLODipine (NORVASC) 5 MG tablet Take 1 tablet (5 mg total) by mouth daily. 90 tablet 1  . fluticasone (FLONASE) 50 MCG/ACT nasal spray Place 2 sprays into both nostrils daily. 16 g 6  . irbesartan (AVAPRO) 75 MG tablet Take 1 tablet (75 mg total) by mouth daily. 30 tablet 5   No current facility-administered medications on file prior to visit.   Review of Systems All otherwise neg per pt     Objective:   Physical Exam BP 120/72 mmHg  Pulse 57  Temp(Src) 98.9 F (37.2 C) (Oral)  Resp 20  Wt 232 lb (105.235 kg)  SpO2 97% VS noted,  Constitutional: Pt appears in no significant distress HENT: Head: NCAT.  Right Ear: External ear normal.  Left Ear: External ear normal.  Eyes: . Pupils are equal, round, and reactive to light. Conjunctivae and EOM are normal Bilat tm's with mild erythema.  Max sinus areas mild tender.  Pharynx with mild erythema, no exudate Neck: Normal range of motion. Neck supple.  Cardiovascular: Normal rate and  regular rhythm.   Pulmonary/Chest: Effort normal and breath sounds without rales or wheezing.  Neurological: Pt is alert. Not confused , motor grossly intact Skin: Skin is warm. No rash, no LE edema Psychiatric: Pt behavior is normal. No agitation.      Assessment & Plan:

## 2015-09-24 ENCOUNTER — Encounter: Payer: Medicare Other | Admitting: Gastroenterology

## 2015-10-28 ENCOUNTER — Ambulatory Visit: Payer: Medicare Other | Admitting: Internal Medicine

## 2016-01-09 ENCOUNTER — Other Ambulatory Visit: Payer: Self-pay | Admitting: *Deleted

## 2016-01-09 MED ORDER — AMLODIPINE BESYLATE 5 MG PO TABS: 5.0000 mg | ORAL_TABLET | Freq: Every day | ORAL | Status: DC

## 2016-02-05 ENCOUNTER — Other Ambulatory Visit: Payer: Self-pay | Admitting: Internal Medicine

## 2016-05-10 ENCOUNTER — Encounter: Payer: Self-pay | Admitting: Podiatry

## 2016-05-10 ENCOUNTER — Ambulatory Visit (INDEPENDENT_AMBULATORY_CARE_PROVIDER_SITE_OTHER): Payer: Medicare Other | Admitting: Podiatry

## 2016-05-10 ENCOUNTER — Ambulatory Visit (INDEPENDENT_AMBULATORY_CARE_PROVIDER_SITE_OTHER): Payer: Medicare Other

## 2016-05-10 VITALS — BP 155/91 | HR 59

## 2016-05-10 DIAGNOSIS — R52 Pain, unspecified: Secondary | ICD-10-CM

## 2016-05-10 DIAGNOSIS — L600 Ingrowing nail: Secondary | ICD-10-CM | POA: Diagnosis not present

## 2016-05-10 DIAGNOSIS — L603 Nail dystrophy: Secondary | ICD-10-CM | POA: Diagnosis not present

## 2016-05-10 DIAGNOSIS — M2041 Other hammer toe(s) (acquired), right foot: Secondary | ICD-10-CM

## 2016-05-10 DIAGNOSIS — M2042 Other hammer toe(s) (acquired), left foot: Secondary | ICD-10-CM

## 2016-05-10 DIAGNOSIS — M21619 Bunion of unspecified foot: Secondary | ICD-10-CM

## 2016-05-10 DIAGNOSIS — M2010 Hallux valgus (acquired), unspecified foot: Secondary | ICD-10-CM | POA: Diagnosis not present

## 2016-05-10 DIAGNOSIS — M79674 Pain in right toe(s): Secondary | ICD-10-CM

## 2016-05-10 DIAGNOSIS — M79673 Pain in unspecified foot: Secondary | ICD-10-CM

## 2016-05-10 NOTE — Patient Instructions (Signed)

## 2016-05-19 ENCOUNTER — Ambulatory Visit (INDEPENDENT_AMBULATORY_CARE_PROVIDER_SITE_OTHER): Payer: Medicare Other | Admitting: Podiatry

## 2016-05-19 ENCOUNTER — Telehealth: Payer: Self-pay | Admitting: Podiatry

## 2016-05-19 DIAGNOSIS — M109 Gout, unspecified: Secondary | ICD-10-CM | POA: Diagnosis not present

## 2016-05-19 DIAGNOSIS — M79676 Pain in unspecified toe(s): Secondary | ICD-10-CM | POA: Diagnosis not present

## 2016-05-19 DIAGNOSIS — M10471 Other secondary gout, right ankle and foot: Secondary | ICD-10-CM | POA: Diagnosis not present

## 2016-05-19 MED ORDER — COLCHICINE 0.6 MG PO TABS: 0.6000 mg | ORAL_TABLET | Freq: Every day | ORAL | 0 refills | Status: DC

## 2016-05-19 NOTE — Telephone Encounter (Signed)
Patient called at 2:32am stating that the nail procedure site has fluid and feels like it is going to pop. He states that area has become increasingly painful but denies any redness or red streaking. No fevers, chills. He has been soaking in epsom salts and using antibiotic ointment and a bandage with no relief. Continue these measures for how and directed him to come to the office today for evaluation.   I spoke with Ferrel Logan and she is going to call the patient to schedule an appointment for today.

## 2016-05-20 LAB — URIC ACID: Uric Acid, Serum: 6.2 mg/dL (ref 4.0–8.0)

## 2016-05-23 MED ORDER — BETAMETHASONE SOD PHOS & ACET 6 (3-3) MG/ML IJ SUSP: 3.0000 mg | Freq: Once | INTRAMUSCULAR | Status: DC

## 2016-05-23 NOTE — Progress Notes (Signed)
Subjective:  Patient presents today for follow-up evaluation of an ingrown toenail removal to the right great toe. Patient states that his right great toenail is painful. Patient also has a new complaint to severe pain to the right first MPJ. Onset was approximately one week ago. Patient denies trauma. Patient believes that he possibly has gout. Patient presents today for further treatment and evaluation    Objective/Physical Exam General: The patient is alert and oriented x3 in no acute distress.  Dermatology: Exposed nail bed to the right great toenail appears viable and healing appropriately. There is no erythema or drainage noted. Skin is warm, dry and supple bilateral lower extremities. Negative for open lesions or macerations.  Vascular: Palpable pedal pulses bilaterally. No edema or erythema noted. Capillary refill within normal limits.  Neurological: Epicritic and protective threshold grossly intact bilaterally.   Musculoskeletal Exam: Pain on palpation range of motion noted to the first MPJ right foot. There is mild edema with warmth noted about the first MPJ right foot. Range of motion within normal limits to all pedal and ankle joints bilateral. Muscle strength 5/5 in all groups bilateral.   Assessment: #1 status post total nail avulsion right great toe #2 possible acute gout first MPJ right foot #3 pain in right foot   Plan of Care:  #1 Patient was evaluated. #2 injection of 0.5 mL Celestone Soluspan injected in the first MPJ of the right foot. #3 orders placed for uric acid levels #4 prescription for Colcrys0.6 mg 5 days #5 return to clinic in 1 week to review uric acid levels and follow-up evaluation  Edrick Kins, DPM [facility]  Dr. Edrick Kins, [address]                                        [place], [place] [address]                Office [phone]  Fax [phone]

## 2016-05-24 ENCOUNTER — Ambulatory Visit (INDEPENDENT_AMBULATORY_CARE_PROVIDER_SITE_OTHER): Payer: Medicare Other | Admitting: Podiatry

## 2016-05-24 ENCOUNTER — Inpatient Hospital Stay (HOSPITAL_COMMUNITY)
Admission: EM | Admit: 2016-05-24 | Discharge: 2016-05-28 | DRG: 603 | Disposition: A | Discharge status: Home / Self Care | Payer: Medicare Other | Attending: Internal Medicine | Admitting: Internal Medicine

## 2016-05-24 ENCOUNTER — Emergency Department (HOSPITAL_COMMUNITY): Payer: Medicare Other

## 2016-05-24 ENCOUNTER — Encounter (HOSPITAL_COMMUNITY): Payer: Self-pay | Admitting: Emergency Medicine

## 2016-05-24 DIAGNOSIS — L03115 Cellulitis of right lower limb: Secondary | ICD-10-CM | POA: Diagnosis not present

## 2016-05-24 DIAGNOSIS — L02611 Cutaneous abscess of right foot: Secondary | ICD-10-CM | POA: Diagnosis not present

## 2016-05-24 DIAGNOSIS — Z8249 Family history of ischemic heart disease and other diseases of the circulatory system: Secondary | ICD-10-CM | POA: Diagnosis not present

## 2016-05-24 DIAGNOSIS — L03119 Cellulitis of unspecified part of limb: Secondary | ICD-10-CM | POA: Diagnosis not present

## 2016-05-24 DIAGNOSIS — Z79899 Other long term (current) drug therapy: Secondary | ICD-10-CM | POA: Diagnosis not present

## 2016-05-24 DIAGNOSIS — L03031 Cellulitis of right toe: Secondary | ICD-10-CM

## 2016-05-24 DIAGNOSIS — N182 Chronic kidney disease, stage 2 (mild): Secondary | ICD-10-CM

## 2016-05-24 DIAGNOSIS — L089 Local infection of the skin and subcutaneous tissue, unspecified: Secondary | ICD-10-CM

## 2016-05-24 DIAGNOSIS — Z22322 Carrier or suspected carrier of Methicillin resistant Staphylococcus aureus: Secondary | ICD-10-CM

## 2016-05-24 DIAGNOSIS — M00871 Arthritis due to other bacteria, right ankle and foot: Secondary | ICD-10-CM | POA: Diagnosis present

## 2016-05-24 DIAGNOSIS — M009 Pyogenic arthritis, unspecified: Secondary | ICD-10-CM

## 2016-05-24 DIAGNOSIS — I129 Hypertensive chronic kidney disease with stage 1 through stage 4 chronic kidney disease, or unspecified chronic kidney disease: Secondary | ICD-10-CM | POA: Diagnosis not present

## 2016-05-24 DIAGNOSIS — L0291 Cutaneous abscess, unspecified: Secondary | ICD-10-CM | POA: Diagnosis present

## 2016-05-24 DIAGNOSIS — I10 Essential (primary) hypertension: Secondary | ICD-10-CM | POA: Diagnosis present

## 2016-05-24 DIAGNOSIS — Z881 Allergy status to other antibiotic agents status: Secondary | ICD-10-CM | POA: Diagnosis not present

## 2016-05-24 DIAGNOSIS — S90821A Blister (nonthermal), right foot, initial encounter: Secondary | ICD-10-CM

## 2016-05-24 DIAGNOSIS — L0390 Cellulitis, unspecified: Secondary | ICD-10-CM | POA: Diagnosis not present

## 2016-05-24 LAB — BASIC METABOLIC PANEL
ANION GAP: 10 (ref 5–15)
BUN: 20 mg/dL (ref 6–20)
CALCIUM: 9.9 mg/dL (ref 8.9–10.3)
CO2: 25 mmol/L (ref 22–32)
CREATININE: 1.34 mg/dL — AB (ref 0.61–1.24)
Chloride: 105 mmol/L (ref 101–111)
GFR, EST NON AFRICAN AMERICAN: 55 mL/min — AB (ref >60)
GLUCOSE: 112 mg/dL — AB (ref 65–99)
Potassium: 3.8 mmol/L (ref 3.5–5.1)
Sodium: 140 mmol/L (ref 135–145)

## 2016-05-24 LAB — CBC WITH DIFFERENTIAL/PLATELET
BASOS ABS: 0 10*3/uL (ref 0.0–0.1)
Basophils Relative: 0 %
Eosinophils Absolute: 0.1 10*3/uL (ref 0.0–0.7)
Eosinophils Relative: 1 %
HCT: 42.3 % (ref 39.0–52.0)
Hemoglobin: 15 g/dL (ref 13.0–17.0)
Lymphocytes Relative: 16 %
Lymphs Abs: 1.9 10*3/uL (ref 0.7–4.0)
MCH: 31.7 pg (ref 26.0–34.0)
MCHC: 35.5 g/dL (ref 30.0–36.0)
MCV: 89.4 fL (ref 78.0–100.0)
MONO ABS: 0.8 10*3/uL (ref 0.1–1.0)
MONOS PCT: 7 %
Neutro Abs: 8.8 10*3/uL — ABNORMAL HIGH (ref 1.7–7.7)
Neutrophils Relative %: 76 %
Platelets: 127 10*3/uL — ABNORMAL LOW (ref 150–400)
RBC: 4.73 MIL/uL (ref 4.22–5.81)
RDW: 13.2 % (ref 11.5–15.5)
WBC: 11.5 10*3/uL — ABNORMAL HIGH (ref 4.0–10.5)

## 2016-05-24 LAB — I-STAT CG4 LACTIC ACID, ED: Lactic Acid, Venous: 1.33 mmol/L (ref 0.5–1.9)

## 2016-05-24 MED ORDER — VANCOMYCIN HCL 10 G IV SOLR
2000.0000 mg | Freq: Once | INTRAVENOUS | Status: AC
  Administered 2016-05-25: 2000 mg via INTRAVENOUS
  Filled 2016-05-24: qty 2000

## 2016-05-24 MED ORDER — VANCOMYCIN HCL 10 G IV SOLR
1250.0000 mg | INTRAVENOUS | Status: DC
  Administered 2016-05-25 – 2016-05-27 (×3): 1250 mg via INTRAVENOUS
  Filled 2016-05-24 (×3): qty 1250

## 2016-05-24 MED ORDER — PIPERACILLIN-TAZOBACTAM 3.375 G IVPB 30 MIN
3.3750 g | Freq: Once | INTRAVENOUS | Status: AC
  Administered 2016-05-24: 3.375 g via INTRAVENOUS
  Filled 2016-05-24: qty 50

## 2016-05-24 NOTE — ED Triage Notes (Signed)
Pt. Stated, I just came from the foot doctor and he sent me here , I had a nail taken off and I guess all around is infected.  He sent me here.

## 2016-05-24 NOTE — ED Provider Notes (Signed)
[facility] DEPT Provider Note   CSN: [id number] Arrival date & time: 05/24/16  1649     History   Chief Complaint Chief Complaint  Patient presents with  . Foot Pain  . Wound Infection    HPI [name] [name] is a 65 y.o. male.  HPI   Patient is a 65 year old male with history of hypertension who presents to the ED with complaint of foot infection, onset one week. Patient reports last week he first noticed a small white blister to the bottom of his right foot. He notes over the next few days the blister became larger. Endorses associated mild intermittent pain, swelling and redness. Patient reports he was evaluated by his podiatrist today for follow-up evaluation after having his right first toenail removed. Patient reports his podiatrist tried to drain the abscess on the bottom of his foot but was unsuccessful. His podiatrist and advised him to come to the ED for admission for IV antibiotics. Denies fever, numbness, tingling, weakness, drainage, nausea, vomiting. Patient denies taking any medications for his symptoms. Denies any recent use of antibiotics.  Podiatrist- Dr. [name]  Past Medical History:  Diagnosis Date  . Hypertension     Patient Active Problem List   Diagnosis Date Noted  . Abscess 05/24/2016  . Acute upper respiratory infection 08/12/2015  . Viral URI with cough 08/08/2015  . Hyperlipidemia 08/02/2015  . Elevated PSA 08/02/2015  . Chronic kidney disease 07/31/2015  . Elevated blood pressure 07/10/2015  . Legally blind in right eye, as defined in [place] 07/10/2015    Past Surgical History:  Procedure Laterality Date  . NO PAST SURGERIES         Home Medications    Prior to Admission medications   Medication Sig Start Date End Date Taking? Authorizing Provider  amLODipine (NORVASC) 5 MG tablet Take 1 tablet (5 mg total) by mouth daily. 01/09/16  Yes [name], MD  colchicine 0.6 MG tablet Take 1 tablet (0.6 mg total) by mouth  daily. Patient taking differently: Take 0.6 mg by mouth See admin instructions. Once a day for gout flares (5-day's course) 05/19/16  Yes Edrick Kins, DPM  irbesartan (AVAPRO) 75 MG tablet Take 1 tablet (75 mg total) by mouth daily. 02/05/16  Yes [name], MD  fluticasone (FLONASE) 50 MCG/ACT nasal spray Place 2 sprays into both nostrils daily. Patient not taking: Reported on 05/24/2016 08/08/15   Rubbie Battiest, NP  HYDROcodone-homatropine [facility]) 5-1.5 MG/5ML syrup Take 5 mLs by mouth every 6 (six) hours as needed for cough. Patient not taking: Reported on 05/24/2016 08/12/15   Biagio Borg, MD  levofloxacin (LEVAQUIN) 250 MG tablet Take 1 tablet (250 mg total) by mouth daily. Patient not taking: Reported on 05/24/2016 08/12/15   Biagio Borg, MD    Family History Family History  Problem Relation Age of Onset  . Cancer Mother   . Heart disease Father   . Hypertension Father     Social History Social History  Substance Use Topics  . Smoking status: Never Smoker  . Smokeless tobacco: Never Used  . Alcohol use No     Allergies   Doxycycline   Review of Systems Review of Systems  Skin: Positive for color change (redness) and wound.  All other systems reviewed and are negative.    Physical Exam Updated Vital Signs BP 158/92   Pulse (!) 58   Temp 97.9 F (36.6 C) (Oral)   Resp 16   Ht  6\' 2"  (1.88 m)   Wt 106.6 kg   SpO2 97%   BMI 30.17 kg/m   Physical Exam  Constitutional: He is oriented to person, place, and time. He appears well-developed and well-nourished.  HENT:  Head: Normocephalic and atraumatic.  Eyes: Conjunctivae and EOM are normal. Right eye exhibits no discharge. Left eye exhibits no discharge. No scleral icterus.  Neck: Normal range of motion. Neck supple.  Cardiovascular: Normal rate, regular rhythm, normal heart sounds and intact distal pulses.   Pulmonary/Chest: Effort normal and breath sounds normal. No respiratory distress. He has no wheezes.  He has no rales. He exhibits no tenderness.  Abdominal: Soft. Bowel sounds are normal. There is no tenderness.  Musculoskeletal: Normal range of motion. He exhibits no edema.  FROM of right foot and ankle with 5/5 strength. 1+ pitting edema noted to left foot. 2+ DP pulse. Sensation grossly intact.   Neurological: He is alert and oriented to person, place, and time.  Skin: Skin is warm and dry.  Macerated skin with 1x2cm area of surrounding erythema and swelling, induration and purulent/bloody drainage. TTP.   Right 1st toe nail removed. No surrounding swelling, erythema or drainage.     Nursing note and vitals reviewed.        ED Treatments / Results  Labs (all labs ordered are listed, but only abnormal results are displayed) Labs Reviewed  CBC WITH DIFFERENTIAL/PLATELET - Abnormal; Notable for the following:       Result Value   WBC 11.5 (*)    Platelets 127 (*)    Neutro Abs 8.8 (*)    All other components within normal limits  BASIC METABOLIC PANEL - Abnormal; Notable for the following:    Glucose, Bld 112 (*)    Creatinine, Ser 1.34 (*)    GFR calc non Af Amer 55 (*)    All other components within normal limits  I-STAT CG4 LACTIC ACID, ED    EKG  EKG Interpretation None       Radiology Dg Foot Complete Right  Result Date: 05/24/2016 CLINICAL DATA:  Infection on the plantar surface of the right foot at the first MTP joint of unknown duration. EXAM: RIGHT FOOT COMPLETE - 3+ VIEW COMPARISON:  None. FINDINGS: Soft tissue swelling is seen about the first MTP joint. No soft tissue gas or radiopaque foreign body is identified. No bony destructive change is seen. Moderate first MTP osteoarthritis is noted. No fracture or dislocation. IMPRESSION: Soft tissue swelling about the first MTP joint. Negative for evidence of osteomyelitis. Moderate first MTP osteoarthritis. Electronically Signed   By: Inge Rise M.D.   On: 05/24/2016 21:54    Procedures Procedures  (including critical care time)  Medications Ordered in ED Medications  piperacillin-tazobactam (ZOSYN) IVPB 3.375 g (not administered)     Initial Impression / Assessment and Plan / ED Course  I have reviewed the triage vital signs and the nursing notes.  Pertinent labs & imaging results that were available during my care of the patient were reviewed by me and considered in my medical decision making (see chart for details).  Clinical Course     Pt presents with right foot infection from podiatry office. Pt reports having worsening white blister to the sole of his right foot with associated pain, swelling and redness. Denies fever. VSS. Exam showed abscess with associated cellulitis to plantar aspect of right foot over 1st MTP joint. Well-healing avulsed right 1st toe nail. 2+ DP pulse. RLE neurovascularly intact.  Chart review shows patient was evaluated by his podiatrist earlier this afternoon. Patient was seen on 11/29 for right first MTP joint pain, was treated with anti-inflammatory injection for suspected gout. Patient also had right first total nail avulsion performed 4 weeks ago. Chart review shows after podiatrist  deroofed blister, large amount of purulent drainage was noted. Cultures were obtained. Patient was sent to the ED for admission requiring IV antibiotics, labs and surgical I&D. Podiatry will follow pt during admission.  WBC 11.5. Lactic 1.33. Right foot xray showed soft tissue swelling at 1st MTP joint, no evidence of osteomyelitis. Orders placed for IV vanc and zosyn. Consulted hospitalist for admission. Orders placed for med-surg bed. Discussed results and plan for admission with patient and family.  Final Clinical Impressions(s) / ED Diagnoses   Final diagnoses:  Abscess    New Prescriptions New Prescriptions   No medications on file     Nona Dell, PA-C 05/24/16 2311    Dorie Rank, MD 05/25/16 1505

## 2016-05-24 NOTE — Progress Notes (Signed)
Subjective:  Patient presents today for follow-up evaluation of what was first assumed to be acute gout to the first MPJ right foot. Patient states that he has not developed a blister on the plantar aspect of the first MPJ right foot. Patient states the pain is severe and is unable to walk. Patient denies trauma. Last visit patient was treated for gout attack first MPJ utilizing injection anti-inflammatory and order for uric acid levels. Patient also has a history of total nail avulsion to the right great toe approximately 4 weeks ago.    Objective/Physical Exam General: The patient is alert and oriented x3 in no acute distress.  Dermatology: Pus filled blister noted to the plantar aspect of the first MPJ right foot. Upon deroofing of the blisteration, heavy amount of purulent drainage noted. There is an ulceration site with purulent drainage at the plantar aspect of the first MPJ. Upon expression of the joint an excessive amount of purulent drainage was expressed. No malodor.  Vascular: Palpable pedal pulses bilaterally. No edema or erythema noted. Capillary refill within normal limits.  Neurological: Epicritic and protective threshold grossly intact bilaterally.   Musculoskeletal Exam: Severe pain on palpation noted to the first MPJ with erythema and cellulitic changes encompassing the entire first metatarsal phalangeal joint both plantar and dorsal aspects skin is warm to touch.  Radiographic Exam:  Normal osseous mineralization. Joint spaces preserved. No fracture/dislocation/boney destruction.    Assessment: #1 cellulitis first MPJ right foot #2 abscess first MPJ right foot #3 status post total nail avulsion right great toe 4 weeks   Plan of Care:  #1 Patient was evaluated. #2 cultures were taken.  #3 upon evaluation of the erythema and abscess, the patient will require surgical incision and drainage to the first MPJ right foot. #4 instructed patient to present to the [facility]  emergency department for admission, IV antibiotics, CBC, chemistry. Patient will be scheduled for surgical intervention consisting of incision and drainage first MPJ right foot. #5 orders for admission were placed on a prescription paper and sent with the patient #6 podiatry will follow if patient is admitted [facility].    Dr. Edrick Kins, [place]

## 2016-05-24 NOTE — Progress Notes (Addendum)
Pharmacy Antibiotic Note  [name] [name] is a 65 y.o. male admitted on 05/24/2016 with R foot cellulitis.  Pharmacy has been consulted for Vancomycin and Zosyn dosing. Estimated normalized CrCl 58 ml/min.  Plan: Zosyn 3.375gm IV q8h - doses over 4 hours Vancomycin 2gm IV now then 1250mg  IV q24h Will f/u micro data, renal function, and pt's clinical condition Vanc trough prn   Height: 6\' 2"  (188 cm) Weight: 235 lb (106.6 kg) IBW/kg (Calculated) : 82.2  Temp (24hrs), Avg:97.9 F (36.6 C), Min:97.9 F (36.6 C), Max:97.9 F (36.6 C)   Recent Labs Lab 05/24/16 2128 05/24/16 2228  WBC 11.5*  --   CREATININE 1.34*  --   LATICACIDVEN  --  1.33    Estimated Creatinine Clearance: 74.4 mL/min (by C-G formula based on SCr of 1.34 mg/dL (H)).    Allergies  Allergen Reactions  . Doxycycline Other (See Comments)    Burns very fast if in the sunlight    Antimicrobials this admission: 12/5 Vanc >>  12/4 zosyn >>  Dose adjustments this admission: n/a  Microbiology results:  Thank you for allowing pharmacy to be a part of this patient's care.  Sherlon Handing, PharmD, BCPS Clinical pharmacist, pager [phone] 05/24/2016 11:44 PM

## 2016-05-24 NOTE — ED Notes (Signed)
Patient transported to X-ray 

## 2016-05-25 ENCOUNTER — Inpatient Hospital Stay (HOSPITAL_COMMUNITY): Payer: Medicare Other | Admitting: Anesthesiology

## 2016-05-25 ENCOUNTER — Encounter (HOSPITAL_COMMUNITY): Payer: Self-pay | Admitting: Internal Medicine

## 2016-05-25 ENCOUNTER — Encounter (HOSPITAL_COMMUNITY)
Admission: EM | Disposition: A | Discharge status: Home / Self Care | Payer: Self-pay | Source: Home / Self Care | Attending: Internal Medicine

## 2016-05-25 DIAGNOSIS — L03119 Cellulitis of unspecified part of limb: Secondary | ICD-10-CM

## 2016-05-25 DIAGNOSIS — I10 Essential (primary) hypertension: Secondary | ICD-10-CM | POA: Diagnosis present

## 2016-05-25 DIAGNOSIS — L03031 Cellulitis of right toe: Secondary | ICD-10-CM | POA: Diagnosis present

## 2016-05-25 HISTORY — PX: INCISION AND DRAINAGE OF WOUND: SHX1803

## 2016-05-25 LAB — COMPREHENSIVE METABOLIC PANEL
ALBUMIN: 3.8 g/dL (ref 3.5–5.0)
ALK PHOS: 58 U/L (ref 38–126)
ALT: 30 U/L (ref 17–63)
ANION GAP: 8 (ref 5–15)
AST: 27 U/L (ref 15–41)
BILIRUBIN TOTAL: 1.1 mg/dL (ref 0.3–1.2)
BUN: 17 mg/dL (ref 6–20)
CALCIUM: 9.2 mg/dL (ref 8.9–10.3)
CO2: 26 mmol/L (ref 22–32)
CREATININE: 1.4 mg/dL — AB (ref 0.61–1.24)
Chloride: 105 mmol/L (ref 101–111)
GFR calc Af Amer: >60 mL/min (ref >60)
GFR calc non Af Amer: 52 mL/min — ABNORMAL LOW (ref >60)
GLUCOSE: 106 mg/dL — AB (ref 65–99)
Potassium: 3.6 mmol/L (ref 3.5–5.1)
Sodium: 139 mmol/L (ref 135–145)
TOTAL PROTEIN: 6.5 g/dL (ref 6.5–8.1)

## 2016-05-25 LAB — GLUCOSE, CAPILLARY
GLUCOSE-CAPILLARY: 107 mg/dL — AB (ref 65–99)
Glucose-Capillary: 90 mg/dL (ref 65–99)
Glucose-Capillary: 88 mg/dL (ref 65–99)

## 2016-05-25 LAB — CBC WITH DIFFERENTIAL/PLATELET
BASOS ABS: 0 10*3/uL (ref 0.0–0.1)
BASOS PCT: 0 %
EOS ABS: 0.1 10*3/uL (ref 0.0–0.7)
EOS PCT: 2 %
HCT: 40 % (ref 39.0–52.0)
Hemoglobin: 13.9 g/dL (ref 13.0–17.0)
Lymphocytes Relative: 20 %
Lymphs Abs: 2 10*3/uL (ref 0.7–4.0)
MCH: 31.2 pg (ref 26.0–34.0)
MCHC: 34.8 g/dL (ref 30.0–36.0)
MCV: 89.7 fL (ref 78.0–100.0)
MONO ABS: 0.6 10*3/uL (ref 0.1–1.0)
MONOS PCT: 6 %
Neutro Abs: 7 10*3/uL (ref 1.7–7.7)
Neutrophils Relative %: 72 %
PLATELETS: 125 10*3/uL — AB (ref 150–400)
RBC: 4.46 MIL/uL (ref 4.22–5.81)
RDW: 13.2 % (ref 11.5–15.5)
WBC: 9.6 10*3/uL (ref 4.0–10.5)

## 2016-05-25 LAB — SURGICAL PCR SCREEN
MRSA, PCR: POSITIVE — AB
STAPHYLOCOCCUS AUREUS: POSITIVE — AB

## 2016-05-25 SURGERY — IRRIGATION AND DEBRIDEMENT WOUND
Anesthesia: General | Site: Foot | Laterality: Right

## 2016-05-25 MED ORDER — FENTANYL CITRATE (PF) 100 MCG/2ML IJ SOLN
INTRAMUSCULAR | Status: AC
  Filled 2016-05-25: qty 2

## 2016-05-25 MED ORDER — BUPIVACAINE HCL (PF) 0.5 % IJ SOLN
INTRAMUSCULAR | Status: AC
  Filled 2016-05-25: qty 30

## 2016-05-25 MED ORDER — CHLORHEXIDINE GLUCONATE CLOTH 2 % EX PADS
6.0000 | MEDICATED_PAD | Freq: Every day | CUTANEOUS | Status: DC
  Administered 2016-05-26 – 2016-05-28 (×3): 6 via TOPICAL

## 2016-05-25 MED ORDER — BUPIVACAINE HCL (PF) 0.5 % IJ SOLN
INTRAMUSCULAR | Status: DC | PRN
  Administered 2016-05-25: 10 mL

## 2016-05-25 MED ORDER — MORPHINE SULFATE (PF) 2 MG/ML IV SOLN
1.0000 mg | INTRAVENOUS | Status: DC | PRN
  Administered 2016-05-25 (×4): 1 mg via INTRAVENOUS
  Filled 2016-05-25 (×4): qty 1

## 2016-05-25 MED ORDER — CHLORHEXIDINE GLUCONATE 4 % EX LIQD
1.0000 "application " | Freq: Once | CUTANEOUS | Status: AC
  Administered 2016-05-25: 1 via TOPICAL
  Filled 2016-05-25: qty 15

## 2016-05-25 MED ORDER — IRBESARTAN 75 MG PO TABS
75.0000 mg | ORAL_TABLET | Freq: Every day | ORAL | Status: DC
  Administered 2016-05-25: 75 mg via ORAL
  Filled 2016-05-25 (×2): qty 1

## 2016-05-25 MED ORDER — ACETAMINOPHEN 650 MG RE SUPP: 650.0000 mg | Freq: Four times a day (QID) | RECTAL | Status: DC | PRN

## 2016-05-25 MED ORDER — LIDOCAINE-EPINEPHRINE (PF) 1 %-1:200000 IJ SOLN
INTRAMUSCULAR | Status: AC
  Filled 2016-05-25: qty 30

## 2016-05-25 MED ORDER — MIDAZOLAM HCL 2 MG/2ML IJ SOLN
INTRAMUSCULAR | Status: AC
  Filled 2016-05-25: qty 2

## 2016-05-25 MED ORDER — AMLODIPINE BESYLATE 5 MG PO TABS
5.0000 mg | ORAL_TABLET | Freq: Every day | ORAL | Status: DC
  Administered 2016-05-25: 5 mg via ORAL
  Filled 2016-05-25 (×2): qty 1

## 2016-05-25 MED ORDER — MUPIROCIN 2 % EX OINT
TOPICAL_OINTMENT | Freq: Two times a day (BID) | CUTANEOUS | Status: DC
  Administered 2016-05-25 (×2): via NASAL
  Administered 2016-05-26: 1 via NASAL
  Administered 2016-05-26 – 2016-05-28 (×4): via NASAL
  Filled 2016-05-25 (×2): qty 22

## 2016-05-25 MED ORDER — PROPOFOL 10 MG/ML IV BOLUS
INTRAVENOUS | Status: DC | PRN
  Administered 2016-05-25: 150 mg via INTRAVENOUS

## 2016-05-25 MED ORDER — LACTATED RINGERS IV SOLN
INTRAVENOUS | Status: DC | PRN
  Administered 2016-05-25: 18:00:00 via INTRAVENOUS

## 2016-05-25 MED ORDER — ONDANSETRON HCL 4 MG/2ML IJ SOLN
INTRAMUSCULAR | Status: AC
  Filled 2016-05-25: qty 2

## 2016-05-25 MED ORDER — PIPERACILLIN-TAZOBACTAM 3.375 G IVPB
3.3750 g | Freq: Three times a day (TID) | INTRAVENOUS | Status: DC
  Administered 2016-05-25 – 2016-05-28 (×11): 3.375 g via INTRAVENOUS
  Filled 2016-05-25 (×13): qty 50

## 2016-05-25 MED ORDER — MORPHINE SULFATE (PF) 4 MG/ML IV SOLN
4.0000 mg | Freq: Once | INTRAVENOUS | Status: AC
Start: 2016-05-25 — End: 2016-05-25
  Administered 2016-05-25: 4 mg via INTRAVENOUS
  Filled 2016-05-25: qty 1

## 2016-05-25 MED ORDER — LIDOCAINE HCL (PF) 1 % IJ SOLN
INTRAMUSCULAR | Status: DC | PRN
  Administered 2016-05-25: 10 mL

## 2016-05-25 MED ORDER — OXYCODONE-ACETAMINOPHEN 5-325 MG PO TABS
2.0000 | ORAL_TABLET | Freq: Once | ORAL | Status: AC
  Administered 2016-05-25: 2 via ORAL
  Filled 2016-05-25: qty 2

## 2016-05-25 MED ORDER — HYDROMORPHONE HCL 1 MG/ML IJ SOLN: 0.2500 mg | INTRAMUSCULAR | Status: DC | PRN

## 2016-05-25 MED ORDER — GLYCOPYRROLATE 0.2 MG/ML IJ SOLN
INTRAMUSCULAR | Status: DC | PRN
  Administered 2016-05-25 (×2): 0.2 mg via INTRAVENOUS

## 2016-05-25 MED ORDER — HYDRALAZINE HCL 20 MG/ML IJ SOLN: 10.0000 mg | INTRAMUSCULAR | Status: DC | PRN

## 2016-05-25 MED ORDER — LIDOCAINE HCL (PF) 1 % IJ SOLN
INTRAMUSCULAR | Status: AC
  Filled 2016-05-25: qty 30

## 2016-05-25 MED ORDER — LIDOCAINE 2% (20 MG/ML) 5 ML SYRINGE
INTRAMUSCULAR | Status: DC | PRN
  Administered 2016-05-25: 60 mg via INTRAVENOUS

## 2016-05-25 MED ORDER — SODIUM CHLORIDE 0.9 % IV SOLN
INTRAVENOUS | Status: AC
  Administered 2016-05-25: 04:00:00 via INTRAVENOUS

## 2016-05-25 MED ORDER — ACETAMINOPHEN 325 MG PO TABS: 650.0000 mg | ORAL_TABLET | Freq: Four times a day (QID) | ORAL | Status: DC | PRN

## 2016-05-25 MED ORDER — MIDAZOLAM HCL 2 MG/2ML IJ SOLN
INTRAMUSCULAR | Status: DC | PRN
  Administered 2016-05-25: 2 mg via INTRAVENOUS

## 2016-05-25 MED ORDER — SODIUM CHLORIDE 0.9 % IR SOLN
Status: DC | PRN
  Administered 2016-05-25: 3000 mL

## 2016-05-25 MED ORDER — ONDANSETRON HCL 4 MG PO TABS: 4.0000 mg | ORAL_TABLET | Freq: Four times a day (QID) | ORAL | Status: DC | PRN

## 2016-05-25 MED ORDER — ONDANSETRON HCL 4 MG/2ML IJ SOLN
4.0000 mg | Freq: Four times a day (QID) | INTRAMUSCULAR | Status: DC | PRN
Start: 2016-05-25 — End: 2016-05-28

## 2016-05-25 MED ORDER — FENTANYL CITRATE (PF) 100 MCG/2ML IJ SOLN
INTRAMUSCULAR | Status: DC | PRN
  Administered 2016-05-25 (×2): 50 ug via INTRAVENOUS

## 2016-05-25 MED ORDER — MORPHINE SULFATE (PF) 2 MG/ML IV SOLN
2.0000 mg | INTRAVENOUS | Status: DC | PRN
  Administered 2016-05-26 – 2016-05-28 (×7): 2 mg via INTRAVENOUS
  Filled 2016-05-25 (×7): qty 1

## 2016-05-25 MED ORDER — PROPOFOL 10 MG/ML IV BOLUS
INTRAVENOUS | Status: AC
  Filled 2016-05-25: qty 20

## 2016-05-25 MED ORDER — ONDANSETRON HCL 4 MG/2ML IJ SOLN
INTRAMUSCULAR | Status: DC | PRN
  Administered 2016-05-25: 4 mg via INTRAVENOUS

## 2016-05-25 SURGICAL SUPPLY — 50 items
APL SKNCLS STERI-STRIP NONHPOA (GAUZE/BANDAGES/DRESSINGS) ×1
APPLICATOR COTTON TIP 6IN STRL (MISCELLANEOUS) IMPLANT
BAG DECANTER FOR FLEXI CONT (MISCELLANEOUS) IMPLANT
BANDAGE ELASTIC 4 VELCRO ST LF (GAUZE/BANDAGES/DRESSINGS) ×2 IMPLANT
BENZOIN TINCTURE PRP APPL 2/3 (GAUZE/BANDAGES/DRESSINGS) ×3 IMPLANT
BNDG GAUZE ELAST 4 BULKY (GAUZE/BANDAGES/DRESSINGS) ×2 IMPLANT
CANISTER SUCTION 2500CC (MISCELLANEOUS) ×3 IMPLANT
CONT SPEC STER OR (MISCELLANEOUS) IMPLANT
COVER SURGICAL LIGHT HANDLE (MISCELLANEOUS) ×3 IMPLANT
DRAPE IMP U-DRAPE 54X76 (DRAPES) ×3 IMPLANT
DRAPE INCISE IOBAN 66X45 STRL (DRAPES) IMPLANT
DRAPE LAPAROSCOPIC ABDOMINAL (DRAPES) IMPLANT
DRAPE LAPAROTOMY 100X72 PEDS (DRAPES) ×3 IMPLANT
DRAPE PROXIMA HALF (DRAPES) IMPLANT
DRESSING HYDROCOLLOID 4X4 (GAUZE/BANDAGES/DRESSINGS) ×3 IMPLANT
DRSG ADAPTIC 3X8 NADH LF (GAUZE/BANDAGES/DRESSINGS) ×2 IMPLANT
DRSG PAD ABDOMINAL 8X10 ST (GAUZE/BANDAGES/DRESSINGS) ×2 IMPLANT
DRSG VAC ATS LRG SENSATRAC (GAUZE/BANDAGES/DRESSINGS) IMPLANT
DRSG VAC ATS MED SENSATRAC (GAUZE/BANDAGES/DRESSINGS) IMPLANT
DRSG VAC ATS SM SENSATRAC (GAUZE/BANDAGES/DRESSINGS) IMPLANT
ELECT CAUTERY BLADE 6.4 (BLADE) IMPLANT
ELECT REM PT RETURN 9FT ADLT (ELECTROSURGICAL) ×3
ELECTRODE REM PT RTRN 9FT ADLT (ELECTROSURGICAL) ×1 IMPLANT
GAUZE IODOFORM PACK 1/2 7832 (GAUZE/BANDAGES/DRESSINGS) ×2 IMPLANT
GAUZE SPONGE 4X4 12PLY STRL (GAUZE/BANDAGES/DRESSINGS) IMPLANT
GAUZE XEROFORM 1X8 LF (GAUZE/BANDAGES/DRESSINGS) ×2 IMPLANT
GLOVE BIO SURGEON STRL SZ 6.5 (GLOVE) ×2 IMPLANT
GLOVE BIO SURGEONS STRL SZ 6.5 (GLOVE) ×1
GOWN STRL REUS W/ TWL LRG LVL3 (GOWN DISPOSABLE) ×3 IMPLANT
GOWN STRL REUS W/TWL LRG LVL3 (GOWN DISPOSABLE) ×9
KIT BASIN OR (CUSTOM PROCEDURE TRAY) ×3 IMPLANT
KIT ROOM TURNOVER OR (KITS) ×3 IMPLANT
NDL HYPO 25GX1X1/2 BEV (NEEDLE) ×1 IMPLANT
NEEDLE HYPO 25GX1X1/2 BEV (NEEDLE) ×3 IMPLANT
NS IRRIG 1000ML POUR BTL (IV SOLUTION) ×3 IMPLANT
PACK GENERAL/GYN (CUSTOM PROCEDURE TRAY) ×3 IMPLANT
PACK UNIVERSAL I (CUSTOM PROCEDURE TRAY) ×3 IMPLANT
PAD ARMBOARD 7.5X6 YLW CONV (MISCELLANEOUS) ×6 IMPLANT
SPONGE GAUZE 4X4 12PLY STER LF (GAUZE/BANDAGES/DRESSINGS) ×2 IMPLANT
STAPLER VISISTAT 35W (STAPLE) ×3 IMPLANT
SURGILUBE 2OZ TUBE FLIPTOP (MISCELLANEOUS) IMPLANT
SUT MNCRL AB 4-0 PS2 18 (SUTURE) IMPLANT
SUT VIC AB 5-0 PS2 18 (SUTURE) IMPLANT
SWAB COLLECTION DEVICE MRSA (MISCELLANEOUS) IMPLANT
SYR CONTROL 10ML LL (SYRINGE) ×3 IMPLANT
TIP PROBE CYLINDRICAL MISONIX (TIP) ×2 IMPLANT
TOWEL OR 17X26 10 PK STRL BLUE (TOWEL DISPOSABLE) ×3 IMPLANT
TUBE ANAEROBIC SPECIMEN COL (MISCELLANEOUS) IMPLANT
TUBE IRRIGATION SET MISONIX (TUBING) ×1 IMPLANT
UNDERPAD 30X30 (UNDERPADS AND DIAPERS) ×3 IMPLANT

## 2016-05-25 NOTE — Brief Op Note (Signed)
05/24/2016 - 05/25/2016  7:55 PM     PATIENT:  [name] [name]  65 y.o. male  PRE-OPERATIVE DIAGNOSIS:  Infection of Right Foot  POST-OPERATIVE DIAGNOSIS:  Infection right foot  PROCEDURE:  Procedure(s): IRRIGATION AND DEBRIDEMENT WOUND RIGHT FOOT (Right)  SURGEON:  Surgeon(s) and Role:    * Edrick Kins, DPM - Primary  PHYSICIAN ASSISTANT:   ASSISTANTS: none   ANESTHESIA:   local and general  EBL:  Total I/O In: 900 [I.V.:900] Out: 20 [Blood:20]  BLOOD ADMINISTERED:none  DRAINS: none   LOCAL MEDICATIONS USED:  MARCAINE    and XYLOCAINE   SPECIMEN:  No Specimen  DISPOSITION OF SPECIMEN:  N/A  COUNTS:  YES  TOURNIQUET:  * No tourniquets in log *  DICTATION: .Dragon Dictation  PLAN OF CARE: Inpatient  PATIENT DISPOSITION:  PACU - hemodynamically stable.   Delay start of Pharmacological VTE agent (>24hrs) due to surgical blood loss or risk of bleeding: not applicable

## 2016-05-25 NOTE — Progress Notes (Signed)
Pt returned to floor from PACU, fully alert and oriented, denied pain, call light within reach, VS stable.

## 2016-05-25 NOTE — Progress Notes (Signed)
Pt c/o left foot pain 10/10 morphine 1 mg given at 2144.  Assessment completed capillary refill less than 3 sec, warm to touch, color appropriate, no increased edema, full sensation, full movement.  Pain unrelieved, MD paged, order for morphine 4 mg placed, and given. Assessment findings unchanged.

## 2016-05-25 NOTE — H&P (Signed)
History and Physical    [name] [name] [id number] DOB: 1951/05/31 DOA: 05/24/2016  PCP: [name], MD  Patient coming from: Home.  Chief Complaint: Right foot swelling and pain.  HPI: [name] [name] is a 65 y.o. male with history of hypertension started experiencing increasing swelling and pain over the right great toe over the last 2 weeks. Patient had avulsion of the right great toe nail 4 weeks ago. Patient was treated for possible gout last week by patient's podiatrist, Dr. [name]. Patient's pain did not improve despite being on Colcrys and yesterday patient's Podiatrist referred patient to the ER for cellulitis versus developing abscess of the right great toe. X-ray show soft tissue swelling. And on exam patient has swelling of the right foot up to the ankle.   ED Course: X-ray was done and patient was started on empiric antibiotics.  Review of Systems: As per HPI, rest all negative.   Past Medical History:  Diagnosis Date  . Hypertension     Past Surgical History:  Procedure Laterality Date  . NO PAST SURGERIES       reports that he has never smoked. He has never used smokeless tobacco. He reports that he does not drink alcohol or use drugs.  Allergies  Allergen Reactions  . Doxycycline Other (See Comments)    Burns very fast if in the sunlight    Family History  Problem Relation Age of Onset  . Cancer Mother   . Heart disease Father   . Hypertension Father     Prior to Admission medications   Medication Sig Start Date End Date Taking? Authorizing Provider  amLODipine (NORVASC) 5 MG tablet Take 1 tablet (5 mg total) by mouth daily. 01/09/16  Yes [name], MD  colchicine 0.6 MG tablet Take 1 tablet (0.6 mg total) by mouth daily. Patient taking differently: Take 0.6 mg by mouth See admin instructions. Once a day for gout flares (5-day's course) 05/19/16  Yes Edrick Kins, DPM  irbesartan (AVAPRO) 75 MG tablet Take 1 tablet (75 mg total) by mouth  daily. 02/05/16  Yes [name], MD  fluticasone (FLONASE) 50 MCG/ACT nasal spray Place 2 sprays into both nostrils daily. Patient not taking: Reported on 05/24/2016 08/08/15   Rubbie Battiest, NP  HYDROcodone-homatropine [facility]) 5-1.5 MG/5ML syrup Take 5 mLs by mouth every 6 (six) hours as needed for cough. Patient not taking: Reported on 05/24/2016 08/12/15   Biagio Borg, MD  levofloxacin (LEVAQUIN) 250 MG tablet Take 1 tablet (250 mg total) by mouth daily. Patient not taking: Reported on 05/24/2016 08/12/15   Biagio Borg, MD    Physical Exam: Vitals:   05/24/16 2300 05/24/16 2315 05/24/16 2330 05/25/16 0000  BP: 158/92 152/92 135/88 136/76  Pulse: (!) 58 61 (!) 53 (!) 53  Resp:    18  Temp:    98.1 F (36.7 C)  TempSrc:    Oral  SpO2: 97% 99% 97% 98%  Weight:      Height:          Constitutional: Moderately built and nourished. Vitals:   05/24/16 2300 05/24/16 2315 05/24/16 2330 05/25/16 0000  BP: 158/92 152/92 135/88 136/76  Pulse: (!) 58 61 (!) 53 (!) 53  Resp:    18  Temp:    98.1 F (36.7 C)  TempSrc:    Oral  SpO2: 97% 99% 97% 98%  Weight:      Height:       Eyes:  Anicteric. no pallor. ENMT: No discharge from the ears eyes nose or mouth. Neck: No JVD appreciated no mass felt. Respiratory: No rhonchi or crepitations. Cardiovascular: S1-S2 heard. No murmurs appreciated. Abdomen: Soft nontender bowel sounds present. Musculoskeletal: Right foot is swollen and some erythema around the right great toe nailbed. Skin: Right great toe erythema. Neurologic: Alert awake oriented to time place and person. Moves all extremities. Psychiatric: Appears normal. Normal affect.   Labs on Admission: I have personally reviewed following labs and imaging studies  CBC:  Recent Labs Lab 05/24/16 2128  WBC 11.5*  NEUTROABS 8.8*  HGB 15.0  HCT 42.3  MCV 89.4  PLT [id number]*   Basic Metabolic Panel:  Recent Labs Lab 05/24/16 2128  NA 140  K 3.8  CL 105  CO2 25  GLUCOSE  112*  BUN 20  CREATININE 1.34*  CALCIUM 9.9   GFR: Estimated Creatinine Clearance: 74.4 mL/min (by C-G formula based on SCr of 1.34 mg/dL (H)). Liver Function Tests: No results for input(s): AST, ALT, ALKPHOS, BILITOT, PROT, ALBUMIN in the last 168 hours. No results for input(s): LIPASE, AMYLASE in the last 168 hours. No results for input(s): AMMONIA in the last 168 hours. Coagulation Profile: No results for input(s): INR, PROTIME in the last 168 hours. Cardiac Enzymes: No results for input(s): CKTOTAL, CKMB, CKMBINDEX, TROPONINI in the last 168 hours. BNP (last 3 results) No results for input(s): PROBNP in the last 8760 hours. HbA1C: No results for input(s): HGBA1C in the last 72 hours. CBG: No results for input(s): GLUCAP in the last 168 hours. Lipid Profile: No results for input(s): CHOL, HDL, LDLCALC, TRIG, CHOLHDL, LDLDIRECT in the last 72 hours. Thyroid Function Tests: No results for input(s): TSH, T4TOTAL, FREET4, T3FREE, THYROIDAB in the last 72 hours. Anemia Panel: No results for input(s): VITAMINB12, FOLATE, FERRITIN, TIBC, IRON, RETICCTPCT in the last 72 hours. Urine analysis:    Component Value Date/Time   COLORURINE YELLOW 05/09/2015 2214   APPEARANCEUR CLOUDY (A) 05/09/2015 2214   LABSPEC 1.010 05/09/2015 2214   PHURINE 6.5 05/09/2015 2214   GLUCOSEU NEGATIVE 05/09/2015 2214   HGBUR NEGATIVE 05/09/2015 2214   BILIRUBINUR NEGATIVE 05/09/2015 2214   BILIRUBINUR neg 02/19/2014 2003   KETONESUR NEGATIVE 05/09/2015 2214   PROTEINUR NEGATIVE 05/09/2015 2214   UROBILINOGEN 0.2 02/19/2014 2003   UROBILINOGEN 0.2 07/16/2013 1723   NITRITE NEGATIVE 05/09/2015 2214   LEUKOCYTESUR NEGATIVE 05/09/2015 2214   Sepsis Labs: @LABRCNTIP (procalcitonin:4,lacticidven:4) )No results found for this or any previous visit (from the past 240 hour(s)).   Radiological Exams on Admission: Dg Foot Complete Right  Result Date: 05/24/2016 CLINICAL DATA:  Infection on the plantar  surface of the right foot at the first MTP joint of unknown duration. EXAM: RIGHT FOOT COMPLETE - 3+ VIEW COMPARISON:  None. FINDINGS: Soft tissue swelling is seen about the first MTP joint. No soft tissue gas or radiopaque foreign body is identified. No bony destructive change is seen. Moderate first MTP osteoarthritis is noted. No fracture or dislocation. IMPRESSION: Soft tissue swelling about the first MTP joint. Negative for evidence of osteomyelitis. Moderate first MTP osteoarthritis. Electronically Signed   By: Inge Rise M.D.   On: 05/24/2016 21:54     Assessment/Plan Principal Problem:   Cellulitis of great toe, right Active Problems:   Essential hypertension   Cellulitis of right toe    1. Cellulitis of the right great toe with possible developing abscess - swelling also encompasses the right foot. At this time patient will be  kept nothing by mouth AM for possible incision and drainage by Dr. [name] patient's podiatrist. Please consult Dr. [name] in a.m. Patient is placed on vancomycin and Zosyn for now. 2. Hypertension on Avapro and amlodipine. 3. Elevated creatinine - follow metabolic panel.   DVT prophylaxis: SCDs. Code Status: Full code.  Family Communication: Discussed with patient.  Disposition Plan: Home.  Consults called: None.  Admission status: Inpatient.    Rise Patience MD [facility] Pager [phone].  If 7PM-7AM, please contact night-coverage [email or web address] Password [email or web address]  05/25/2016, 2:49 AM

## 2016-05-25 NOTE — Progress Notes (Signed)
Care of pt assumed by MA Mackenzi Krogh RN 

## 2016-05-25 NOTE — Anesthesia Postprocedure Evaluation (Signed)
Anesthesia Post Note  Patient: [name] [name]  Procedure(s) Performed: Procedure(s) (LRB): IRRIGATION AND DEBRIDEMENT WOUND RIGHT FOOT (Right)  Patient location during evaluation: PACU Anesthesia Type: General Level of consciousness: awake and alert Pain management: pain level controlled Vital Signs Assessment: post-procedure vital signs reviewed and stable Respiratory status: spontaneous breathing, nonlabored ventilation and respiratory function stable Cardiovascular status: blood pressure returned to baseline and stable Postop Assessment: no signs of nausea or vomiting Anesthetic complications: no    Last Vitals:  Vitals:   05/25/16 2000 05/25/16 2015  BP: 136/84 126/90  Pulse: 88 85  Resp: 14 20  Temp:  36.1 C    Last Pain:  Vitals:   05/25/16 2015  TempSrc:   PainSc: 0-No pain                 Deaunte Dente,W. EDMOND

## 2016-05-25 NOTE — Anesthesia Preprocedure Evaluation (Addendum)
Anesthesia Evaluation  Patient identified by MRN, date of birth, ID band Patient awake    Reviewed: Allergy & Precautions, H&P , NPO status , Patient's Chart, lab work & pertinent test results  Airway Mallampati: III  TM Distance: >3 FB Neck ROM: Full    Dental no notable dental hx. (+) Teeth Intact, Dental Advisory Given   Pulmonary neg pulmonary ROS,    Pulmonary exam normal breath sounds clear to auscultation       Cardiovascular hypertension, Pt. on medications  Rhythm:Regular Rate:Normal     Neuro/Psych negative neurological ROS  negative psych ROS   GI/Hepatic negative GI ROS, Neg liver ROS,   Endo/Other  negative endocrine ROS  Renal/GU Renal InsufficiencyRenal disease  negative genitourinary   Musculoskeletal   Abdominal   Peds  Hematology negative hematology ROS (+)   Anesthesia Other Findings   Reproductive/Obstetrics negative OB ROS                            Anesthesia Physical Anesthesia Plan  ASA: II  Anesthesia Plan: General   Post-op Pain Management:    Induction: Intravenous  Airway Management Planned: LMA  Additional Equipment:   Intra-op Plan:   Post-operative Plan: Extubation in OR  Informed Consent: I have reviewed the patients History and Physical, chart, labs and discussed the procedure including the risks, benefits and alternatives for the proposed anesthesia with the patient or authorized representative who has indicated his/her understanding and acceptance.   Dental advisory given  Plan Discussed with: CRNA  Anesthesia Plan Comments:         Anesthesia Quick Evaluation

## 2016-05-25 NOTE — Transfer of Care (Signed)
Immediate Anesthesia Transfer of Care Note  Patient: [name] [name]  Procedure(s) Performed: Procedure(s): IRRIGATION AND DEBRIDEMENT WOUND RIGHT FOOT (Right)  Patient Location: PACU  Anesthesia Type:General  Level of Consciousness: awake, alert  and oriented  Airway & Oxygen Therapy: Patient Spontanous Breathing  Post-op Assessment: Report given to RN and Post -op Vital signs reviewed and stable  Post vital signs: Reviewed and stable  Last Vitals:  Vitals:   05/25/16 2015 05/25/16 2059  BP: 126/90   Pulse: 85 81  Resp: 20 18  Temp: 36.1 C 36.7 C    Last Pain:  Vitals:   05/25/16 2059  TempSrc: Oral  PainSc:       Patients Stated Pain Goal: 4 ([id number])  Complications: No apparent anesthesia complications

## 2016-05-25 NOTE — Care Management Note (Signed)
Case Management Note  Patient Details  Name: [name] [name] MRN: [id number] Date of Birth: 29-Oct-1950  Subjective/Objective:                 Patient from home with wife, independent PTA, admitted with wound to bottom of foot, will have I&D today. May need DME does not have any at home.  PCP [place]   Action/Plan:  CM will continue to follow for DC planning Expected Discharge Date:                  Expected Discharge Plan:  Home/Self Care  In-House Referral:     Discharge planning Services  CM Consult  Post Acute Care Choice:    Choice offered to:     DME Arranged:    DME Agency:     HH Arranged:    HH Agency:     Status of Service:  In process, will continue to follow  If discussed at Long Length of Stay Meetings, dates discussed:    Additional Comments:  Carles Collet, RN 05/25/2016, 1:21 PM

## 2016-05-25 NOTE — Progress Notes (Signed)
Pt arrived to [facility] at [facility], pt fully alert and oriented. VS collected, no signs of acute distress. Pt oriented to room and instructed to use call light for assistance. Admission notified of pt arrival to floor.

## 2016-05-25 NOTE — Progress Notes (Signed)
Patient seen and examined this morning.   Seen full H&P from earlier this morning for details, briefly [name] [name] is a pleasant 65 yo male sent to the ED from podiatrist office yesterday afternoon for surgical incision and drainage of 1st MPJ. After right great toe nail avulsion 4 weeks ago he began having pain in the toe that worsened. Empiric treatment for gout was ineffective. Evaluation at Dr. [name]' office showed pus-filled blister on plantar aspect of right foot. This was unroofed, cultures taken, though he was thought to require surgical management and IV abx. On arrival to the ED he was afebrile, cellulitis with abscess of right great toe with XR showing soft tissue swelling. Vancomycin and zosyn were started.   He reports localized pain without fevers, remains in no distress with stable vital signs, afebrile. I've called and spoke with Dr. [name] this morning. He will be formally consulted, contact the OR for procedure after 5pm today. Pt will be allowed to eat breakfast, then NPO. MRSA screening swab + so will institute that order set.   Vance Gather, MD 05/25/2016 9:35 AM

## 2016-05-25 NOTE — Anesthesia Procedure Notes (Signed)
Procedure Name: LMA Insertion Date/Time: 05/25/2016 6:52 PM Performed by: Manuela Schwartz B Pre-anesthesia Checklist: Patient identified, Emergency Drugs available, Suction available, Patient being monitored and Timeout performed Patient Re-evaluated:Patient Re-evaluated prior to inductionOxygen Delivery Method: Circle system utilized Preoxygenation: Pre-oxygenation with 100% oxygen Intubation Type: IV induction LMA: LMA inserted LMA Size: 5.0 Number of attempts: 1 Placement Confirmation: positive ETCO2 and breath sounds checked- equal and bilateral Tube secured with: Tape Dental Injury: Teeth and Oropharynx as per pre-operative assessment

## 2016-05-26 ENCOUNTER — Encounter (HOSPITAL_COMMUNITY): Payer: Self-pay | Admitting: Podiatry

## 2016-05-26 DIAGNOSIS — I10 Essential (primary) hypertension: Secondary | ICD-10-CM

## 2016-05-26 DIAGNOSIS — L03031 Cellulitis of right toe: Principal | ICD-10-CM

## 2016-05-26 DIAGNOSIS — N182 Chronic kidney disease, stage 2 (mild): Secondary | ICD-10-CM

## 2016-05-26 DIAGNOSIS — M009 Pyogenic arthritis, unspecified: Secondary | ICD-10-CM

## 2016-05-26 LAB — CBC
HEMATOCRIT: 41.1 % (ref 39.0–52.0)
HEMOGLOBIN: 13.9 g/dL (ref 13.0–17.0)
MCH: 30.8 pg (ref 26.0–34.0)
MCHC: 33.8 g/dL (ref 30.0–36.0)
MCV: 90.9 fL (ref 78.0–100.0)
Platelets: 115 10*3/uL — ABNORMAL LOW (ref 150–400)
RBC: 4.52 MIL/uL (ref 4.22–5.81)
RDW: 13.5 % (ref 11.5–15.5)
WBC: 9.7 10*3/uL (ref 4.0–10.5)

## 2016-05-26 LAB — BASIC METABOLIC PANEL
ANION GAP: 7 (ref 5–15)
BUN: 15 mg/dL (ref 6–20)
CHLORIDE: 101 mmol/L (ref 101–111)
CO2: 28 mmol/L (ref 22–32)
Calcium: 8.8 mg/dL — ABNORMAL LOW (ref 8.9–10.3)
Creatinine, Ser: 1.49 mg/dL — ABNORMAL HIGH (ref 0.61–1.24)
GFR calc non Af Amer: 49 mL/min — ABNORMAL LOW (ref >60)
GFR, EST AFRICAN AMERICAN: 56 mL/min — AB (ref >60)
Glucose, Bld: 99 mg/dL (ref 65–99)
POTASSIUM: 3.5 mmol/L (ref 3.5–5.1)
Sodium: 136 mmol/L (ref 135–145)

## 2016-05-26 LAB — GLUCOSE, CAPILLARY
GLUCOSE-CAPILLARY: 107 mg/dL — AB (ref 65–99)
GLUCOSE-CAPILLARY: 122 mg/dL — AB (ref 65–99)
GLUCOSE-CAPILLARY: 130 mg/dL — AB (ref 65–99)
Glucose-Capillary: 118 mg/dL — ABNORMAL HIGH (ref 65–99)

## 2016-05-26 MED ORDER — OXYCODONE-ACETAMINOPHEN 5-325 MG PO TABS
1.0000 | ORAL_TABLET | Freq: Four times a day (QID) | ORAL | Status: DC | PRN
  Administered 2016-05-26 – 2016-05-28 (×5): 1 via ORAL
  Filled 2016-05-26 (×5): qty 1

## 2016-05-26 NOTE — Progress Notes (Signed)
Blood pressure 102/64 notified MD. Per md stop bp meds. Holding pain medication. Patient denied tylenol.   Betha Loa Levonne Carreras, RN

## 2016-05-26 NOTE — Progress Notes (Signed)
PROGRESS NOTE  [name] [name] [id number] DOB: 11-26-1950 DOA: 05/24/2016 PCP: [name], MD  Brief History:  65 year old male with a history of hypertension presented with increasing pain and peeling drainage from a blister on his right first MTP joint. The patient had a hangnail approximately one month prior to this admission and underwent nail avulsion. He followed up with his podiatrist, Dr. [name], on 05/19/2016. At that time, it was felt the patient may have had gouty arthritis flare due to complaints of severe pain of his right first MTPJ the patient was given a prescription for colchicine and Celestone was injected intra-articular into the right first MTPJ. The patient presented back to the podiatry office for follow-up on 05/24/2016 where he was noted to have developed a blister on the plantar aspect of his right first MTPJ. This was unroofed and the office with a heavy amount of purulent drainage noted. There was also an ulceration site with purulent drainage at the plantar aspect of the first MTPJ. The patient was sent to the emergency department for further evaluation. Right foot x-rays showed soft tissue edema without signs of osteomyelitis. The patient was started on intravenous vancomycin and Zosyn.  Ass essment/Plan: Cellulitis right foot /septic arthritis/abscess right first MTPJ  -Status post irrigation and debridement 05/25/2016  -Continue vancomycin and Zosyn pending culture data  -wound care per podiatry  Hypertension  -Amlodipine and irbesartan d/c due to soft BPs  CKD stage 2  -baseline creatinine 1.2-1.4  Disposition Plan:   Home in 1-2 days  Family Communication:   No Family at bedside  Consultants:  podiatry  Code Status:  FULL  DVT Prophylaxis:  SCDs   Procedures: As Listed in Progress Note Above  Antibiotics: Vancomycin 12/5>>> Zosyn 05/25/16>>>    Subjective:  patient states the pain is under control in the right foot.  Patient  denies fevers, chills, headache, chest pain, dyspnea, nausea, vomiting, diarrhea, abdominal pain, dysuria, hematuria, hematochezia, and melena.    Objective: Vitals:   05/26/16 1125 05/26/16 1311 05/26/16 1344 05/26/16 1515  BP: 102/64 116/69 116/74 108/68  Pulse: (!) 51 (!) 52 (!) 54 78  Resp:   18   Temp:   97.9 F (36.6 C)   TempSrc:   Oral   SpO2: 96% 94% 95%   Weight:      Height:        Intake/Output Summary (Last 24 hours) at 05/26/16 1557 Last data filed at 05/26/16 0300  Gross per 24 hour  Intake           3017.5 ml  Output             1570 ml  Net           1447.5 ml   Weight change:  Exam:   General:  Pt is alert, follows commands appropriately, not in acute distress  HEENT: No icterus, No thrush, No neck mass, [place]/AT  Cardiovascular: RRR, S1/S2, no rubs, no gallops  Respiratory: CTA bilaterally, no wheezing, no crackles, no rhonchi  Abdomen: Soft/+BS, non tender, non distended, no guarding  Extremities: No edema, No lymphangitis, No petechiae, No rashes, no synovitis;  Right foot in bulky dressing   Data Reviewed: I have personally reviewed following labs and imaging studies Basic Metabolic Panel:  Recent Labs Lab 05/24/16 2128 05/25/16 0405 05/26/16 0509  NA 140 139 136  K 3.8 3.6 3.5  CL 105 105 101  CO2 25 26  28  GLUCOSE 112* 106* 99  BUN 20 17 15   CREATININE 1.34* 1.40* 1.49*  CALCIUM 9.9 9.2 8.8*   Liver Function Tests:  Recent Labs Lab 05/25/16 0405  AST 27  ALT 30  ALKPHOS 58  BILITOT 1.1  PROT 6.5  ALBUMIN 3.8   No results for input(s): LIPASE, AMYLASE in the last 168 hours. No results for input(s): AMMONIA in the last 168 hours. Coagulation Profile: No results for input(s): INR, PROTIME in the last 168 hours. CBC:  Recent Labs Lab 05/24/16 2128 05/25/16 0405 05/26/16 0509  WBC 11.5* 9.6 9.7  NEUTROABS 8.8* 7.0  --   HGB 15.0 13.9 13.9  HCT 42.3 40.0 41.1  MCV 89.4 89.7 90.9  PLT 127* 125* 115*   Cardiac  Enzymes: No results for input(s): CKTOTAL, CKMB, CKMBINDEX, TROPONINI in the last 168 hours. BNP: Invalid input(s): POCBNP CBG:  Recent Labs Lab 05/25/16 1155 05/25/16 1715 05/26/16 0021 05/26/16 0649 05/26/16 1140  GLUCAP 90 88 130* 107* 122*   HbA1C: No results for input(s): HGBA1C in the last 72 hours. Urine analysis:    Component Value Date/Time   COLORURINE YELLOW 05/09/2015 2214   APPEARANCEUR CLOUDY (A) 05/09/2015 2214   LABSPEC 1.010 05/09/2015 2214   PHURINE 6.5 05/09/2015 2214   GLUCOSEU NEGATIVE 05/09/2015 2214   HGBUR NEGATIVE 05/09/2015 2214   BILIRUBINUR NEGATIVE 05/09/2015 2214   BILIRUBINUR neg 02/19/2014 2003   KETONESUR NEGATIVE 05/09/2015 2214   PROTEINUR NEGATIVE 05/09/2015 2214   UROBILINOGEN 0.2 02/19/2014 2003   UROBILINOGEN 0.2 07/16/2013 1723   NITRITE NEGATIVE 05/09/2015 2214   LEUKOCYTESUR NEGATIVE 05/09/2015 2214   Sepsis Labs: @LABRCNTIP (procalcitonin:4,lacticidven:4) ) Recent Results (from the past 240 hour(s))  Surgical pcr screen     Status: Abnormal   Collection Time: 05/25/16  3:32 AM  Result Value Ref Range Status   MRSA, PCR POSITIVE (A) NEGATIVE Final    Comment: RESULT CALLED TO, READ BACK BY AND VERIFIED WITH: R MESLENJAK,RN @0702  05/25/16 MKELLY,MLT    Staphylococcus aureus POSITIVE (A) NEGATIVE Final    Comment:        The Xpert SA Assay (FDA approved for NASAL specimens in patients over 73 years of age), is one component of a comprehensive surveillance program.  Test performance has been validated by [facility] for patients greater than or equal to 91 year old. It is not intended to diagnose infection nor to guide or monitor treatment.   Aerobic/Anaerobic Culture (surgical/deep wound)     Status: None (Preliminary result)   Collection Time: 05/25/16  7:18 PM  Result Value Ref Range Status   Specimen Description WOUND RIGHT FOOT  Final   Special Requests NONE  Final   Gram Stain   Final    MODERATE WBC  PRESENT, PREDOMINANTLY PMN FEW GRAM POSITIVE COCCI IN PAIRS    Culture TOO YOUNG TO READ  Final   Report Status PENDING  Incomplete     Scheduled Meds: . Chlorhexidine Gluconate Cloth  6 each Topical Q0600  . mupirocin ointment   Nasal BID  . piperacillin-tazobactam (ZOSYN)  IV  3.375 g Intravenous Q8H  . vancomycin  1,250 mg Intravenous Q24H   Continuous Infusions:  Procedures/Studies: Dg Foot Complete Left  Result Date: 05/13/2016 See detailed Radiographic Exam in chart note.   Dg Foot Complete Right  Result Date: 05/24/2016 CLINICAL DATA:  Infection on the plantar surface of the right foot at the first MTP joint of unknown duration. EXAM: RIGHT FOOT COMPLETE - 3+  VIEW COMPARISON:  None. FINDINGS: Soft tissue swelling is seen about the first MTP joint. No soft tissue gas or radiopaque foreign body is identified. No bony destructive change is seen. Moderate first MTP osteoarthritis is noted. No fracture or dislocation. IMPRESSION: Soft tissue swelling about the first MTP joint. Negative for evidence of osteomyelitis. Moderate first MTP osteoarthritis. Electronically Signed   By: Inge Rise M.D.   On: 05/24/2016 21:54   Dg Foot Complete Right  Result Date: 05/13/2016 See detailed Radiographic Exam in chart note.    Sydni Elizarraraz, DO  [facility] Pager [phone]  If 7PM-7AM, please contact night-coverage [email or web address] Password [email or web address] 05/26/2016, 3:57 PM   LOS: 2 days

## 2016-05-27 DIAGNOSIS — L0390 Cellulitis, unspecified: Secondary | ICD-10-CM

## 2016-05-27 DIAGNOSIS — L02611 Cutaneous abscess of right foot: Secondary | ICD-10-CM

## 2016-05-27 DIAGNOSIS — L0291 Cutaneous abscess, unspecified: Secondary | ICD-10-CM

## 2016-05-27 LAB — BASIC METABOLIC PANEL
Anion gap: 8 (ref 5–15)
BUN: 21 mg/dL — AB (ref 6–20)
CHLORIDE: 103 mmol/L (ref 101–111)
CO2: 26 mmol/L (ref 22–32)
CREATININE: 1.68 mg/dL — AB (ref 0.61–1.24)
Calcium: 8.7 mg/dL — ABNORMAL LOW (ref 8.9–10.3)
GFR calc Af Amer: 49 mL/min — ABNORMAL LOW (ref >60)
GFR calc non Af Amer: 42 mL/min — ABNORMAL LOW (ref >60)
Glucose, Bld: 104 mg/dL — ABNORMAL HIGH (ref 65–99)
POTASSIUM: 3.6 mmol/L (ref 3.5–5.1)
Sodium: 137 mmol/L (ref 135–145)

## 2016-05-27 LAB — CBC
HCT: 39.4 % (ref 39.0–52.0)
HEMOGLOBIN: 13.3 g/dL (ref 13.0–17.0)
MCH: 31 pg (ref 26.0–34.0)
MCHC: 33.8 g/dL (ref 30.0–36.0)
MCV: 91.8 fL (ref 78.0–100.0)
Platelets: 121 10*3/uL — ABNORMAL LOW (ref 150–400)
RBC: 4.29 MIL/uL (ref 4.22–5.81)
RDW: 13.7 % (ref 11.5–15.5)
WBC: 7.4 10*3/uL (ref 4.0–10.5)

## 2016-05-27 LAB — GLUCOSE, CAPILLARY
GLUCOSE-CAPILLARY: 108 mg/dL — AB (ref 65–99)
GLUCOSE-CAPILLARY: 109 mg/dL — AB (ref 65–99)
GLUCOSE-CAPILLARY: 117 mg/dL — AB (ref 65–99)
Glucose-Capillary: 112 mg/dL — ABNORMAL HIGH (ref 65–99)
Glucose-Capillary: 93 mg/dL (ref 65–99)

## 2016-05-27 LAB — VANCOMYCIN, TROUGH: VANCOMYCIN TR: 8 ug/mL — AB (ref 15–20)

## 2016-05-27 MED ORDER — VANCOMYCIN HCL 10 G IV SOLR
1500.0000 mg | INTRAVENOUS | Status: DC
  Filled 2016-05-27: qty 1500

## 2016-05-27 MED ORDER — SODIUM CHLORIDE 0.9 % IV SOLN
INTRAVENOUS | Status: AC
  Administered 2016-05-27: 17:00:00 via INTRAVENOUS
  Filled 2016-05-27: qty 1000

## 2016-05-27 NOTE — Op Note (Signed)
05/24/2016 - 05/25/2016  7:55 PM                                     PATIENT:  [name] [name]  65 y.o. male  PRE-OPERATIVE DIAGNOSIS:   deep tissue abscess. Cellulitis. Infection of Right Foot  POST-OPERATIVE DIAGNOSIS:   St.  PROCEDURE:  procedure(s): INCISION AND DRAINAGE RIGHT FOOT  IRRIGATION AND DEBRIDEMENT WOUND RIGHT FOOT (Right)  SURGEON:  Surgeon(s) and Role:    * Edrick Kins, DPM - Primary  PHYSICIAN ASSISTANT: NONE  ASSISTANTS: none   ANESTHESIA:   local and general Local anesthesia infiltration was utilized using a 50-50 mixture of 1% lidocaine plain with 0.5% Marcaine plain totaling 20 mL in the patient's right ankle.  EBL:  Total I/O In: 900 [I.V.:900] Out: 20 [Blood:20]  BLOOD ADMINISTERED:none  DRAINS: none   LOCAL MEDICATIONS USED: 0.5%  MARCAINE    and 1% XYLOCAINE   SPECIMEN:  No Specimen  DISPOSITION OF SPECIMEN:  N/A  COUNTS:  YES  TOURNIQUET:  * No tourniquets in log *  JUSTIFICATION FOR PROCEDURE:  This is a 65 year old male who presents to the [facility] for surgical correction of cellulitis with deep tissue abscess to the right foot.The patient was last seen and evaluated in the office at the [facility] on 05/24/2016. Upon evaluation, the patient was instructed to present to the emergency department for admission, IV antibiotics, and surgical intervention including incision and drainage to the right foot. The patient was told of the benefits as well as possible side effects of the surgery. The patient consented for surgical correction. The patient consent form was reviewed. All patient questions were answered. No guarantees were expressed or implied. The patient and the surgeon boson the patient consent form with the witness present and placed in the patient's chart.  PROCEDURE IN DETAIL:  The patient was brought to the operating room placed in the operating table in a supine position at which time an  aseptic scrub and drape were performed about the patient's right lower extremity after anesthesia was induced as described above. Attention was then directed to the plantar aspect of the first MPJ of the right foot where procedure #1 commenced.  PROCEDURE #1: INCISION AND DRAINAGE RIGHT FOOT An open draining wound was noted about the plantar aspect of the first MPJ. At this site a curved mosquito was inserted into the deep tissue abscesses and blunt dissection and exploration was performed. The foot was then expressed and excessive amounts of purulent drainage were drained from the plantar aspect of the first MPJ. At this time deep tissue wound cultures were taken and sent to pathology for both anaerobic, aerobic, and Gram stain.  Approximately 10-20 mL of purulent drainage was expressed from the opening of the plantar foot. There was no significant malodor that would be consistent with gas gangrene. At this time a dorsal incision was made overlying the lateral aspect of the first MPJ of the right foot. Black tissue dissection was then performed and exploration was performed to release all deep tissue abscesses within the foot. Again purulent drainage was expressed from the dorsal incision site. At this time copious irrigation was utilized using an Integra Misonex irrigation system.  Please note that the deep tissue abscesses and wound exploration extended proximal past the MPJ to the proximal aspect of the medial longitudinal arch of the foot. Deep  tissue tunneling and exploration was approximately 10 cm from the opening on the plantar aspect of the first MPJ. After copious irrigation, 1/2 inch iodoform packing was then utilized to both the plantar and dorsal incision sites. Dry sterile dressings were then applied to all previously mentioned incision sites about the patient's right lower extremity. The patient was then transferred from the operating room to the recovery room having tolerated the procedure  and anesthesia well. All vital signs are stable. After preceding recovery room, the patient was readmitted to his room with prescriptions for adequate analgesic care for in hospital use.  PLAN OF CARE: Inpatient.   IMPRESSION: Please note there was an excessive amount of purulent drainage with deep tunneling greater than 10 cm within the deep compartments of the foot.  Recommend not discharging the patient until final cultures are obtained. Recommend patient be discharged on oral antibiotic medication appropriate for culture results.  PATIENT DISPOSITION:  PACU - hemodynamically stable.  The patient can be weightbearing heel touch only to the right foot in a postoperative shoe.   Delay start of Pharmacological VTE agent (>24hrs) due to surgical blood loss or risk of bleeding: not applicable  Edrick Kins, DPM [facility]  Dr. Edrick Kins, [address]                                        [place], [place] [address]                Office [phone]  Fax [phone]

## 2016-05-27 NOTE — Evaluation (Signed)
Physical Therapy Evaluation Patient Details Name: [name] [name] MRN: [id number] DOB: 21-Dec-1950 Today's Date: 05/27/2016   History of Present Illness  65 yo male admitted with R toe cellulitis, abscess. S/P I &D 1st MPJ 05/25/16.   Clinical Impression  On eval, pt required Min guard-Min assist for mobility. He walked ~100 feet with a RW and ~7 feet with crutches. Pain rated 8/10 at rest, 9/10 after walking in hallway with RW. Crutch gait training limited due to increased pain. Pt would benefit from at least one more PT session prior to d/c to continue gait and stair training with crutches. He prefers to d/c home with crutches.     Follow Up Recommendations No PT follow up;Supervision for mobility/OOB    Equipment Recommendations  Crutches (pt prefers to go home with crutches instead of a walker)    Recommendations for Other Services       Precautions / Restrictions Precautions Precautions: Fall Required Braces or Orthoses: Other Brace/Splint Other Brace/Splint: post op shoe Restrictions Weight Bearing Restrictions: Yes RLE Weight Bearing: Weight bearing as tolerated Other Position/Activity Restrictions: through heel only-with post op shoe      Mobility  Bed Mobility Overal bed mobility: Independent                Transfers Overall transfer level: Needs assistance   Transfers: Sit to/from Stand Sit to Stand: Min guard         General transfer comment: close guard for safety. VCs for hand placement when using RW. VCs for crutch/hand placement when standing with crutches.   Ambulation/Gait Ambulation/Gait assistance: Min guard;Min assist Ambulation Distance (Feet): 100 Feet (100'x1 with RW, 7 feet x 1 with crutches) Assistive device: Rolling walker (2 wheeled);Crutches Gait Pattern/deviations: Step-to pattern     General Gait Details: close guard assist with RW use. Min assist to steady with crutch use. Only practiced a few steps with crutches due to  increased pain after walking in hallway just prior.   Stairs            Wheelchair Mobility    Modified Rankin (Stroke Patients Only)       Balance                                             Pertinent Vitals/Pain Pain Assessment: 0-10 Pain Score: 9  Pain Location: 8/10 at rest; 9/10 with ambulation Pain Descriptors / Indicators: Aching;Sore;Throbbing Pain Intervention(s): Limited activity within patient's tolerance;Repositioned    Home Living Family/patient expects to be discharged to:: Private residence Living Arrangements: Spouse/significant other Available Help at Discharge: Family Type of Home: House Home Access: Stairs to enter Entrance Stairs-Rails: None Technical brewer of Steps: 2 Home Layout: One level Home Equipment: None      Prior Function Level of Independence: Independent               Hand Dominance        Extremity/Trunk Assessment   Upper Extremity Assessment: Overall WFL for tasks assessed           Lower Extremity Assessment: RLE deficits/detail RLE Deficits / Details: dressing on R foot.     Cervical / Trunk Assessment: Normal  Communication   Communication: No difficulties  Cognition Arousal/Alertness: Awake/alert Behavior During Therapy: WFL for tasks assessed/performed Overall Cognitive Status: Within Functional Limits for tasks assessed  General Comments      Exercises     Assessment/Plan    PT Assessment Patient needs continued PT services  PT Problem List Decreased strength;Decreased mobility;Decreased activity tolerance;Decreased balance;Decreased knowledge of use of DME;Pain          PT Treatment Interventions DME instruction;Gait training;Therapeutic activities;Therapeutic exercise;Patient/family education;Balance training;Stair training;Functional mobility training    PT Goals (Current goals can be found in the Care Plan section)  Acute Rehab PT  Goals Patient Stated Goal: home soon. less pain. PT Goal Formulation: With patient Time For Goal Achievement: 06/10/16 Potential to Achieve Goals: Good    Frequency Min 3X/week   Barriers to discharge        Co-evaluation               End of Session Equipment Utilized During Treatment: Gait belt Activity Tolerance: Patient limited by pain Patient left: in bed;with call bell/phone within reach           Time: 1449-1522 PT Time Calculation (min) (ACUTE ONLY): 33 min   Charges:   PT Evaluation $PT Eval Low Complexity: 1 Procedure PT Treatments $Gait Training: 8-22 mins   PT G Codes:        Weston Anna, MPT Pager: [phone]

## 2016-05-27 NOTE — Progress Notes (Signed)
Pharmacy Antibiotic Note  [name] [name] is a 65 y.o. male admitted on 05/24/2016 with R foot cellulitis.  On D#3 Vancomycin and Zosyn. Vancomycin trough = 8, subtherapeutic, patient only received 3 doses. Scr slightly trending up today 1.4 >> 1.68 Estimated normalized CrCl 60 ml/min. Pt is afebrile, wbc trending down. RN already give tonight's dose. BMET pending for tomorrow morning  Vancomycin trough goal = 10-15 mcg/ml Plan: Increase vancomycin to 1500 mg IV Q 24 hrs for now Monitor renal function closely, if scr continue trending up, consider keep dose at 1250 mg Q 24 tomorrow.   Height: 6\' 2"  (188 cm) Weight: 235 lb (106.6 kg) IBW/kg (Calculated) : 82.2  Temp (24hrs), Avg:98.7 F (37.1 C), Min:98.4 F (36.9 C), Max:99 F (37.2 C)   Recent Labs Lab 05/24/16 2128 05/24/16 2228 05/25/16 0405 05/26/16 0509 05/27/16 0418 05/27/16 2105  WBC 11.5*  --  9.6 9.7 7.4  --   CREATININE 1.34*  --  1.40* 1.49* 1.68*  --   LATICACIDVEN  --  1.33  --   --   --   --   VANCOTROUGH  --   --   --   --   --  8*    Estimated Creatinine Clearance: 59.3 mL/min (by C-G formula based on SCr of 1.68 mg/dL (H)).    Allergies  Allergen Reactions  . Doxycycline Other (See Comments)    Burns very fast if in the sunlight    Antimicrobials this admission: 12/5 Vanc >>  12/4 zosyn >>  Dose adjustments this admission: Vancomycin trough = 8 on 1250 mg Q 24 hrs  Microbiology results:  Thank you for allowing pharmacy to be a part of this patient's care.  Maryanna Shape, PharmD, BCPS  Clinical Pharmacist  Pager: [phone]   05/27/2016 10:36 PM

## 2016-05-27 NOTE — Progress Notes (Signed)
Orthopedic Tech Progress Note Patient Details:  [name] [name] 12/14/1950 [id number]  Ortho Devices Type of Ortho Device: Postop shoe/boot   [name] 05/27/2016, 1:38 PM

## 2016-05-27 NOTE — Progress Notes (Signed)
PT Cancellation Note  Patient Details Name: [name] [name] MRN: [id number] DOB: 07-05-1950   Cancelled Treatment:    Reason Eval/Treat Not Completed: Patient not medically ready. Noted in op note that pt is to be WB through heel only in post-op shoe.  No orders for WB status or post-op shoe.  Spoke with nursing and looked in room and no shoe in room at this time.  Paged MD requesting order for post op shoe. Will check back.    Lynnsey Barbara LUBECK 05/27/2016, 10:43 AM

## 2016-05-27 NOTE — Progress Notes (Signed)
PROGRESS NOTE  Bader Lello [id number] DOB: 06-27-1950 DOA: 05/24/2016 PCP: [name], MD  BRIEF HISTORY:  Patient presents today at bedside status post incision and drainage to the right foot. Date of surgery 05/25/2016. Patient was seen in the office of the [facility] on 05/24/2016. The patient presented with acute cellulitis with deep tissue abscess of the right foot. At that time the patient was instructed to present to the emergency department for admission, IV antibiotics, and surgical intervention consisting of incision and drainage to the right foot. Findings regarding the surgery on 05/25/2016 were consistent with deep tissue abscess. Excessive purulent drainage was noted and deep tissue abscess and exploration extended to the proximal aspect of the rear foot within the deep compartments of the foot >10 cm from the plantar aspect of the first MPJ. There was negative sign of distinct malodor during intraoperative incision and drainage that would be consistent with gas gangrene.  OBJECTIVE:  Today the patient is resting comfortably at bedside. Patient complains of pain 8/10. Verbal order for oral Percocet 5/325 mg placed.  Dressings were left intact today. Dressing change orders will be placed after the first dressing change which is scheduled for the evening of 05/27/2016 by surgeon.   ASSESSMENT / PLAN:  -Status post incision and drainage right foot. DOS 05/25/2016. - Continue antibiotic regimen managed by in-house hospitalist physician.  - From a surgical standpoint, the patient is okay to be discharged pending final culture and sensitivity results and appropriate oral antibiotic. - Podiatry will continue to follow while inpatient - Upon discharge, patient will follow up in office.

## 2016-05-27 NOTE — Progress Notes (Signed)
PROGRESS NOTE  [name] [name] [id number] DOB: Jul 20, 1950 DOA: 05/24/2016 PCP: [name], MD  Brief History:  65 year old male with a history of hypertension presented with increasing pain and peeling drainage from a blister on his right first MTP joint. The patient had a hangnail approximately one month prior to this admission and underwent nail avulsion. He followed up with his podiatrist, Dr. [name], on 05/19/2016. At that time, it was felt the patient may have had gouty arthritis flare due to complaints of severe pain of his right first MTPJ the patient was given a prescription for colchicine and Celestone was injected intra-articular into the right first MTPJ. The patient presented back to the podiatry office for follow-up on 05/24/2016 where he was noted to have developed a blister on the plantar aspect of his right first MTPJ. This was unroofed and the office with a heavy amount of purulent drainage noted. There was also an ulceration site with purulent drainage at the plantar aspect of the first MTPJ. The patient was sent to the emergency department for further evaluation. Right foot x-rays showed soft tissue edema without signs of osteomyelitis. The patient was started on intravenous vancomycin and Zosyn. The patient was taken to surgery for irrigation and debridement on 05/25/2016.  Assessment/Plan: Cellulitis right foot /septic arthritis/abscess right foot -Status post irrigation and debridement 05/25/2016  -Continue vancomycin and Zosyn pending culture data  -appreciate podiatry, Dr. [name] -deep tissue abscesses and wound exploration extended proximal past the MPJ to the proximal aspect of the medial longitudinal arch of the foot.   Hypertension  -Amlodipine and irbesartan d/c due to soft BPs  CKD stage 2  -baseline creatinine 1.2-1.4 -IVF for elevated creatinine x 12 hour   Disposition Plan:   Home 12/8 or 12/9 Family Communication:   No Family at  bedside  Consultants:  podiatry  Code Status:  FULL  DVT Prophylaxis:  SCDs   Procedures: As Listed in Progress Note Above  Antibiotics: Vancomycin 12/5>>> Zosyn 05/25/16>>>        Subjective: Patient complains of some right foot pain but denies any fevers, chills, chest pain, shortness breath, nausea, vomiting, diarrhea, abdominal pain. No dysuria or hematuria. No rashes.  Objective: Vitals:   05/26/16 1515 05/26/16 2123 05/27/16 0604 05/27/16 1440  BP: 108/68 (!) 95/55 129/79 123/69  Pulse: 78 65 (!) 57 62  Resp:  20 20 16   Temp:  99.4 F (37.4 C) 98.8 F (37.1 C) 99 F (37.2 C)  TempSrc:  Oral Oral Oral  SpO2:  96% 96% 97%  Weight:      Height:        Intake/Output Summary (Last 24 hours) at 05/27/16 1632 Last data filed at 05/27/16 1000  Gross per 24 hour  Intake              100 ml  Output              400 ml  Net             -300 ml   Weight change:  Exam:   General:  Pt is alert, follows commands appropriately, not in acute distress  HEENT: No icterus, No thrush, No neck mass, [place]/AT  Cardiovascular: RRR, S1/S2, no rubs, no gallops  Respiratory: CTA bilaterally, no wheezing, no crackles, no rhonchi  Abdomen: Soft/+BS, non tender, non distended, no guarding  Extremities: trace LE edema, No lymphangitis, No petechiae, No rashes, no synovitis;  R-foot in bulky dressing   Data Reviewed: I have personally reviewed following labs and imaging studies Basic Metabolic Panel:  Recent Labs Lab 05/24/16 2128 05/25/16 0405 05/26/16 0509 05/27/16 0418  NA 140 139 136 137  K 3.8 3.6 3.5 3.6  CL 105 105 101 103  CO2 25 26 28 26   GLUCOSE 112* 106* 99 104*  BUN 20 17 15  21*  CREATININE 1.34* 1.40* 1.49* 1.68*  CALCIUM 9.9 9.2 8.8* 8.7*   Liver Function Tests:  Recent Labs Lab 05/25/16 0405  AST 27  ALT 30  ALKPHOS 58  BILITOT 1.1  PROT 6.5  ALBUMIN 3.8   No results for input(s): LIPASE, AMYLASE in the last 168 hours. No results  for input(s): AMMONIA in the last 168 hours. Coagulation Profile: No results for input(s): INR, PROTIME in the last 168 hours. CBC:  Recent Labs Lab 05/24/16 2128 05/25/16 0405 05/26/16 0509 05/27/16 0418  WBC 11.5* 9.6 9.7 7.4  NEUTROABS 8.8* 7.0  --   --   HGB 15.0 13.9 13.9 13.3  HCT 42.3 40.0 41.1 39.4  MCV 89.4 89.7 90.9 91.8  PLT 127* 125* 115* 121*   Cardiac Enzymes: No results for input(s): CKTOTAL, CKMB, CKMBINDEX, TROPONINI in the last 168 hours. BNP: Invalid input(s): POCBNP CBG:  Recent Labs Lab 05/26/16 1140 05/26/16 1639 05/27/16 0053 05/27/16 0602 05/27/16 1205  GLUCAP 122* 118* 117* 109* 93   HbA1C: No results for input(s): HGBA1C in the last 72 hours. Urine analysis:    Component Value Date/Time   COLORURINE YELLOW 05/09/2015 2214   APPEARANCEUR CLOUDY (A) 05/09/2015 2214   LABSPEC 1.010 05/09/2015 2214   PHURINE 6.5 05/09/2015 2214   GLUCOSEU NEGATIVE 05/09/2015 2214   HGBUR NEGATIVE 05/09/2015 2214   BILIRUBINUR NEGATIVE 05/09/2015 2214   BILIRUBINUR neg 02/19/2014 2003   KETONESUR NEGATIVE 05/09/2015 2214   PROTEINUR NEGATIVE 05/09/2015 2214   UROBILINOGEN 0.2 02/19/2014 2003   UROBILINOGEN 0.2 07/16/2013 1723   NITRITE NEGATIVE 05/09/2015 2214   LEUKOCYTESUR NEGATIVE 05/09/2015 2214   Sepsis Labs: @LABRCNTIP (procalcitonin:4,lacticidven:4) ) Recent Results (from the past 240 hour(s))  Surgical pcr screen     Status: Abnormal   Collection Time: 05/25/16  3:32 AM  Result Value Ref Range Status   MRSA, PCR POSITIVE (A) NEGATIVE Final    Comment: RESULT CALLED TO, READ BACK BY AND VERIFIED WITH: R MESLENJAK,RN @0702  05/25/16 MKELLY,MLT    Staphylococcus aureus POSITIVE (A) NEGATIVE Final    Comment:        The Xpert SA Assay (FDA approved for NASAL specimens in patients over 61 years of age), is one component of a comprehensive surveillance program.  Test performance has been validated by [facility] for patients greater than  or equal to 34 year old. It is not intended to diagnose infection nor to guide or monitor treatment.   Aerobic/Anaerobic Culture (surgical/deep wound)     Status: None (Preliminary result)   Collection Time: 05/25/16  7:18 PM  Result Value Ref Range Status   Specimen Description WOUND RIGHT FOOT  Final   Special Requests NONE  Final   Gram Stain   Final    MODERATE WBC PRESENT, PREDOMINANTLY PMN FEW GRAM POSITIVE COCCI IN PAIRS    Culture CULTURE REINCUBATED FOR BETTER GROWTH  Final   Report Status PENDING  Incomplete     Scheduled Meds: . Chlorhexidine Gluconate Cloth  6 each Topical Q0600  . mupirocin ointment   Nasal BID  . piperacillin-tazobactam (ZOSYN)  IV  3.375 g Intravenous Q8H  . vancomycin  1,250 mg Intravenous Q24H   Continuous Infusions: . sodium chloride 0.9 % 1,000 mL with potassium chloride 20 mEq infusion      Procedures/Studies: Dg Foot Complete Left  Result Date: 05/13/2016 See detailed Radiographic Exam in chart note.   Dg Foot Complete Right  Result Date: 05/24/2016 CLINICAL DATA:  Infection on the plantar surface of the right foot at the first MTP joint of unknown duration. EXAM: RIGHT FOOT COMPLETE - 3+ VIEW COMPARISON:  None. FINDINGS: Soft tissue swelling is seen about the first MTP joint. No soft tissue gas or radiopaque foreign body is identified. No bony destructive change is seen. Moderate first MTP osteoarthritis is noted. No fracture or dislocation. IMPRESSION: Soft tissue swelling about the first MTP joint. Negative for evidence of osteomyelitis. Moderate first MTP osteoarthritis. Electronically Signed   By: Inge Rise M.D.   On: 05/24/2016 21:54   Dg Foot Complete Right  Result Date: 05/13/2016 See detailed Radiographic Exam in chart note.    Mayelin Panos, DO  [facility] Pager [phone]  If 7PM-7AM, please contact night-coverage [email or web address] Password [email or web address] 05/27/2016, 4:32 PM   LOS: 3 days

## 2016-05-28 LAB — BASIC METABOLIC PANEL
ANION GAP: 9 (ref 5–15)
BUN: 16 mg/dL (ref 6–20)
CALCIUM: 8.7 mg/dL — AB (ref 8.9–10.3)
CHLORIDE: 104 mmol/L (ref 101–111)
CO2: 26 mmol/L (ref 22–32)
Creatinine, Ser: 1.58 mg/dL — ABNORMAL HIGH (ref 0.61–1.24)
GFR calc non Af Amer: 45 mL/min — ABNORMAL LOW (ref >60)
GFR, EST AFRICAN AMERICAN: 52 mL/min — AB (ref >60)
Glucose, Bld: 95 mg/dL (ref 65–99)
Potassium: 3.8 mmol/L (ref 3.5–5.1)
SODIUM: 139 mmol/L (ref 135–145)

## 2016-05-28 LAB — GLUCOSE, CAPILLARY
GLUCOSE-CAPILLARY: 93 mg/dL (ref 65–99)
GLUCOSE-CAPILLARY: 110 mg/dL — AB (ref 65–99)

## 2016-05-28 MED ORDER — OXYCODONE-ACETAMINOPHEN 5-325 MG PO TABS: 1.0000 | ORAL_TABLET | Freq: Four times a day (QID) | ORAL | 0 refills | Status: DC | PRN

## 2016-05-28 MED ORDER — DOXYCYCLINE HYCLATE 100 MG PO TABS: 100.0000 mg | ORAL_TABLET | Freq: Two times a day (BID) | ORAL | Status: DC

## 2016-05-28 MED ORDER — DOXYCYCLINE HYCLATE 100 MG PO TABS: 100.0000 mg | ORAL_TABLET | Freq: Two times a day (BID) | ORAL | 0 refills | Status: DC

## 2016-05-28 NOTE — Progress Notes (Signed)
Pt given discharge instructions, prescriptions, and care notes. Pt verbalized understanding AEB no further questions or concerns at this time. IV was discontinued, no redness, pain, or swelling noted at this time. Pt left the floor via wheelchair with staff in stable condition. Ortho tech delivered crutches to room.

## 2016-05-28 NOTE — Progress Notes (Signed)
Orthopedic Tech Progress Note Patient Details:  [name] [name] [birth year] [id number]  Ortho Devices Type of Ortho Device: Crutches Ortho Device/Splint Location: Provided Crutches for pt and 5W05 as orderd by Nurse.  Orders are located in "Other Orders" within the chart. Ortho Device/Splint Interventions: Adjustment   [name] 05/28/2016, 3:54 PM

## 2016-05-28 NOTE — Discharge Summary (Signed)
Physician Discharge Summary  [name] [name] [id number] DOB: 1950-11-11 DOA: 05/24/2016  PCP: [name], MD  Admit date: 05/24/2016 Discharge date: 05/28/2016  Admitted From: Home Disposition: Home  Recommendations for Outpatient Follow-up:  1. Follow up with PCP in 1-2 weeks 2. Please obtain BMP/CBC in one week 3. Please follow up with Dr. [name] on 05/31/16  Home Health: No Equipment/Devices:No  Discharge Condition:stable CODE STATUS:FULL Diet recommendation: Heart Healthy    Brief/Interim Summary: 65 year old male with a history of hypertension presented with increasing pain and peeling drainage from a blister on his right first MTP joint. The patient had a hangnail approximately one month prior to this admission and underwent nail avulsion. He followed up with his podiatrist, Dr. [name], on 05/19/2016. At that time, it was felt the patient may have had gouty arthritis flare due to complaints of severe pain of his right first MTPJ the patient was given a prescription for colchicine and Celestone was injected intra-articular into the rightfirst MTPJ. The patient presented back to the podiatry office for follow-up on 05/24/2016 where he was noted to have developed a blister on the plantar aspect of his right first MTPJ. This was unroofed and the office with a heavy amount of purulent drainage noted. There was also an ulceration site with purulent drainage at the plantar aspect of the first MTPJ. The patient was sent to the emergency department for further evaluation. Right foot x-rays showed soft tissue edema without signs of osteomyelitis. The patient was started on intravenous vancomycin and Zosyn. The patient was taken to surgery for irrigation and debridement on 05/25/2016.  Once intraoperative cultures returned MRSA, zosyn was discontinued.  Pt will go home with doxy.  He has hx of photosensitivity with doxy.  Instructed pt to use sunscreen and stay out of sun.  Hesitant to  use TMP/SMZ due to renal insufficiency.  Discharge Diagnoses:  Cellulitis right foot /septic arthritis/abscess right foot -Status post irrigation and debridement 05/25/2016  -Continue vancomycin and Zosyn -d/c zosyn -appreciate podiatry, Dr. [name] -deep tissue abscesses and wound exploration extended proximal past the MPJ to the proximal aspect of the medial longitudinal arch of the foot.  -intraoperative cultures = MRSA -home with doxycycline 100 mg bid x 11 more days -Hesitant to use TMP/SMZ due to renal insufficiency. -home with post-op shoe and weight bearing on right heel only  Hypertension  -Amlodipine and irbesartan d/c due to soft BPs -restart amlodipine -will not restart ARB due CKD and high risk of ATN  CKD stage 2  -baseline creatinine 1.2-1.4 -IVF for elevated creatinine x 12 hour-->serum creatinine showing improvement   Antibiotics: Vancomycin 12/5>>>12/8 Zosyn 05/25/16>>>12/8 Doxycycline 12/8>>>  Discharge Instructions  Discharge Instructions    Diet - low sodium heart healthy    Complete by:  As directed    Increase activity slowly    Complete by:  As directed        Medication List    STOP taking these medications   colchicine 0.6 MG tablet   fluticasone 50 MCG/ACT nasal spray Commonly known as:  FLONASE   HYDROcodone-homatropine 5-1.5 MG/5ML syrup Commonly known as:  HYCODAN   irbesartan 75 MG tablet Commonly known as:  AVAPRO   levofloxacin 250 MG tablet Commonly known as:  LEVAQUIN     TAKE these medications   amLODipine 5 MG tablet Commonly known as:  NORVASC Take 1 tablet (5 mg total) by mouth daily.   doxycycline 100 MG tablet Commonly known as:  VIBRA-TABS Take 1  tablet (100 mg total) by mouth every 12 (twelve) hours.   oxyCODONE-acetaminophen 5-325 MG tablet Commonly known as:  PERCOCET/ROXICET Take 1 tablet by mouth every 6 (six) hours as needed for moderate pain.       Allergies  Allergen Reactions  . Doxycycline  Other (See Comments)    Burns very fast if in the sunlight    Consultations:  [place], Dr. Marilynn Rail   Procedures/Studies: Dg Foot Complete Left  Result Date: 05/13/2016 See detailed Radiographic Exam in chart note.   Dg Foot Complete Right  Result Date: 05/24/2016 CLINICAL DATA:  Infection on the plantar surface of the right foot at the first MTP joint of unknown duration. EXAM: RIGHT FOOT COMPLETE - 3+ VIEW COMPARISON:  None. FINDINGS: Soft tissue swelling is seen about the first MTP joint. No soft tissue gas or radiopaque foreign body is identified. No bony destructive change is seen. Moderate first MTP osteoarthritis is noted. No fracture or dislocation. IMPRESSION: Soft tissue swelling about the first MTP joint. Negative for evidence of osteomyelitis. Moderate first MTP osteoarthritis. Electronically Signed   By: Inge Rise M.D.   On: 05/24/2016 21:54   Dg Foot Complete Right  Result Date: 05/13/2016 See detailed Radiographic Exam in chart note.         Discharge Exam: Vitals:   05/28/16 0506 05/28/16 1429  BP: (!) 156/86 (!) 146/82  Pulse: (!) 58 (!) 58  Resp: 17 18  Temp: 97.7 F (36.5 C) 98.1 F (36.7 C)   Vitals:   05/27/16 1440 05/27/16 2200 05/28/16 0506 05/28/16 1429  BP: 123/69 134/83 (!) 156/86 (!) 146/82  Pulse: 62 (!) 56 (!) 58 (!) 58  Resp: 16 17 17 18   Temp: 99 F (37.2 C) 98.4 F (36.9 C) 97.7 F (36.5 C) 98.1 F (36.7 C)  TempSrc: Oral Oral Oral Oral  SpO2: 97% 96% 94% 98%  Weight:      Height:        General: Pt is alert, awake, not in acute distress Cardiovascular: RRR, S1/S2 +, no rubs, no gallops Respiratory: CTA bilaterally, no wheezing, no rhonchi Abdominal: Soft, NT, ND, bowel sounds + Extremities: no edema, no cyanosis;  R-foot in bulky dressing   The results of significant diagnostics from this hospitalization (including imaging, microbiology, ancillary and laboratory) are listed below for reference.     Significant Diagnostic Studies: Dg Foot Complete Left  Result Date: 05/13/2016 See detailed Radiographic Exam in chart note.   Dg Foot Complete Right  Result Date: 05/24/2016 CLINICAL DATA:  Infection on the plantar surface of the right foot at the first MTP joint of unknown duration. EXAM: RIGHT FOOT COMPLETE - 3+ VIEW COMPARISON:  None. FINDINGS: Soft tissue swelling is seen about the first MTP joint. No soft tissue gas or radiopaque foreign body is identified. No bony destructive change is seen. Moderate first MTP osteoarthritis is noted. No fracture or dislocation. IMPRESSION: Soft tissue swelling about the first MTP joint. Negative for evidence of osteomyelitis. Moderate first MTP osteoarthritis. Electronically Signed   By: Inge Rise M.D.   On: 05/24/2016 21:54   Dg Foot Complete Right  Result Date: 05/13/2016 See detailed Radiographic Exam in chart note.     Microbiology: Recent Results (from the past 240 hour(s))  Surgical pcr screen     Status: Abnormal   Collection Time: 05/25/16  3:32 AM  Result Value Ref Range Status   MRSA, PCR POSITIVE (A) NEGATIVE Final    Comment: RESULT  CALLED TO, READ BACK BY AND VERIFIED WITH: R MESLENJAK,RN @0702  05/25/16 MKELLY,MLT    Staphylococcus aureus POSITIVE (A) NEGATIVE Final    Comment:        The Xpert SA Assay (FDA approved for NASAL specimens in patients over 38 years of age), is one component of a comprehensive surveillance program.  Test performance has been validated by [facility] for patients greater than or equal to 11 year old. It is not intended to diagnose infection nor to guide or monitor treatment.   Aerobic/Anaerobic Culture (surgical/deep wound)     Status: None (Preliminary result)   Collection Time: 05/25/16  7:18 PM  Result Value Ref Range Status   Specimen Description WOUND RIGHT FOOT  Final   Special Requests NONE  Final   Gram Stain   Final    MODERATE WBC PRESENT, PREDOMINANTLY PMN FEW GRAM  POSITIVE COCCI IN PAIRS    Culture   Final    MODERATE METHICILLIN RESISTANT STAPHYLOCOCCUS AUREUS NO ANAEROBES ISOLATED; CULTURE IN PROGRESS FOR 5 DAYS    Report Status PENDING  Incomplete   Organism ID, Bacteria METHICILLIN RESISTANT STAPHYLOCOCCUS AUREUS  Final      Susceptibility   Methicillin resistant staphylococcus aureus - MIC*    CIPROFLOXACIN >=8 RESISTANT Resistant     ERYTHROMYCIN >=8 RESISTANT Resistant     GENTAMICIN <=0.5 SENSITIVE Sensitive     OXACILLIN >=4 RESISTANT Resistant     TETRACYCLINE <=1 SENSITIVE Sensitive     VANCOMYCIN <=0.5 SENSITIVE Sensitive     TRIMETH/SULFA <=10 SENSITIVE Sensitive     CLINDAMYCIN <=0.25 SENSITIVE Sensitive     RIFAMPIN <=0.5 SENSITIVE Sensitive     Inducible Clindamycin NEGATIVE Sensitive     * MODERATE METHICILLIN RESISTANT STAPHYLOCOCCUS AUREUS     Labs: Basic Metabolic Panel:  Recent Labs Lab 05/24/16 2128 05/25/16 0405 05/26/16 0509 05/27/16 0418 05/28/16 0525  NA 140 139 136 137 139  K 3.8 3.6 3.5 3.6 3.8  CL 105 105 101 103 104  CO2 25 26 28 26 26   GLUCOSE 112* 106* 99 104* 95  BUN 20 17 15  21* 16  CREATININE 1.34* 1.40* 1.49* 1.68* 1.58*  CALCIUM 9.9 9.2 8.8* 8.7* 8.7*   Liver Function Tests:  Recent Labs Lab 05/25/16 0405  AST 27  ALT 30  ALKPHOS 58  BILITOT 1.1  PROT 6.5  ALBUMIN 3.8   No results for input(s): LIPASE, AMYLASE in the last 168 hours. No results for input(s): AMMONIA in the last 168 hours. CBC:  Recent Labs Lab 05/24/16 2128 05/25/16 0405 05/26/16 0509 05/27/16 0418  WBC 11.5* 9.6 9.7 7.4  NEUTROABS 8.8* 7.0  --   --   HGB 15.0 13.9 13.9 13.3  HCT 42.3 40.0 41.1 39.4  MCV 89.4 89.7 90.9 91.8  PLT 127* 125* 115* 121*   Cardiac Enzymes: No results for input(s): CKTOTAL, CKMB, CKMBINDEX, TROPONINI in the last 168 hours. BNP: Invalid input(s): POCBNP CBG:  Recent Labs Lab 05/27/16 1205 05/27/16 1759 05/27/16 2340 05/28/16 0509 05/28/16 1130  GLUCAP 93 108* 112*  93 110*    Time coordinating discharge:  Greater than 30 minutes  Signed:  Datra Clary, DO [facility] Pager: [phone] 05/28/2016, 2:31 PM

## 2016-05-30 LAB — AEROBIC/ANAEROBIC CULTURE W GRAM STAIN (SURGICAL/DEEP WOUND)

## 2016-05-30 LAB — AEROBIC/ANAEROBIC CULTURE (SURGICAL/DEEP WOUND)

## 2016-05-30 NOTE — Progress Notes (Signed)
Subjective:  Patient presents as a new patient today for evaluation of bilateral bunion pain. Patient states that he also is concerned he possibly has an ingrown toenail to the right lower extremity. Patient presents today for further treatment and I'll patient    Objective/Physical Exam General: The patient is alert and oriented x3 in no acute distress.  Dermatology: Incurvated, painful toenails noted to the right great toe. Pain and tenderness noted with lateral compression of the periungual nail fold. Pain with direct compression of the great toenails well. Toenail is thickened, elongated, and dystrophic. Skin is warm, dry and supple bilateral lower extremities. Negative for open lesions or macerations.  Vascular: Palpable pedal pulses bilaterally. No edema or erythema noted. Capillary refill within normal limits.  Neurological: Epicritic and protective threshold grossly intact bilaterally.   Musculoskeletal Exam: Hallux abductovalgus deformity with bunions noted to the bilateral lower extremities. Hammertoe digits 2-5 bilateral lower extremities noted Range of motion within normal limits to all pedal and ankle joints bilateral. Muscle strength 5/5 in all groups bilateral.   Radiographic Exam:  Increased intermetatarsal angle greater than 15 noted to the bilateral lower extremities consistent with hallux abductovalgus deformity. Hallux abductus angle greater than 30. 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 #2 today we discussed the conservative versus surgical management of bunion and hammertoe deformities. At the moment patient opts for conservative management. #3 today we  discussed conservative versus surgical management of right great toenail pain. The patient would like to have a permanent total nail avulsion performed to the right great toe. #4 permanent total toenail avulsion was performed to the right great toe. Prior to procedure local anesthesia infiltration was utilized using a 50-50 mixture of 2% lidocaine plain with 0.5% Marcaine plain. Betadine prep was used for aseptic technique. #5 Phenol [id number] seconds applications of phenol was utilized followed by alcohol flush. Light dressing applied. #6 return to clinic in 2 weeks for follow-up evaluation  Discuss possible surgical intervention on visit regarding bunion and hammertoe deformities.  Edrick Kins, DPM [facility]  Dr. Edrick Kins, [address]                                        [place], [place] [address]                Office [phone]  Fax [phone]

## 2016-05-31 ENCOUNTER — Encounter: Payer: Self-pay | Admitting: Podiatry

## 2016-05-31 ENCOUNTER — Ambulatory Visit (INDEPENDENT_AMBULATORY_CARE_PROVIDER_SITE_OTHER): Payer: Medicare Other | Admitting: Podiatry

## 2016-05-31 VITALS — Temp 97.3°F

## 2016-05-31 DIAGNOSIS — Z9889 Other specified postprocedural states: Secondary | ICD-10-CM

## 2016-05-31 MED ORDER — SULFAMETHOXAZOLE-TRIMETHOPRIM 800-160 MG PO TABS: 1.0000 | ORAL_TABLET | Freq: Two times a day (BID) | ORAL | 1 refills | Status: DC

## 2016-06-01 ENCOUNTER — Telehealth: Payer: Self-pay | Admitting: *Deleted

## 2016-06-01 NOTE — Telephone Encounter (Addendum)
-----   Message from Edrick Kins, DPM sent at 05/31/2016  3:47 PM EST ----- Regarding: [place] Please provide home health dressing changes 3x/week x 6 weeks.  Dx : status post incision and drainage right foot. Patient is homebound and cannot drive.   - cleanse with normal saline or wound cleanser. Dry.  - pack plantar hole/wound with 1/2" plain gauze packing.  - dress with aquacel ag, 4x4 guaze, large kerlex, and light ace wrap.   Thanks,  Dr. [name]. 06/01/2016-Faxed copy of 05/31/2016 orders to [facility]. Pt's wife [place] asked if home health had been set up. Estill Bamberg - [facility] states she has the orders and the demographics are being faxed to her. Tiffany - [facility] states pt's insurance is out of network. 06/02/2016-I spoke with [facility] and told her I was still trying to get pt home health care agency, but could she bring him in for a dressing change tomorrow or Friday. Pearl initially stated she worked to 4:00pm and then stated may be able to work something out. I transferred to schedulers to assist. 06/03/2016-Faxed required [facility] form, orders, LOV and demographics. [facility] states can not accept pt into their care, not enough staff. Faxed required [facility] form, LOV, and demographics. Faxed required [facility] form, LOV and demographics. Faxed required [facility] form, LOV and demographics. [facility] states they are not able to accept pt, but called to [facility] [phone] and Nanine Means did accept, and Ms [place] sent the pt's information and orders to [facility]. 06/04/2016-Melissa - [facility] states will start care 06/07/2016, would like to know pt's next appt. I informed Melissa pt's next appt is 06/16/2016. 06/09/2016-Pam - [facility] states they have begun home health care. Elmyra Ricks - [facility] states have been doing wound care 06/05/2016, 06/07/2016, and 06/09/2016. I told her to continue the home  health care until I had new information. I spoke with Myrtha Mantis and she states pt's wife stated [facility] was performing the home health care. I told [facility] PT. Elmyra Ricks states pt is covered 100% for home health care. 06/22/2016-Faxed copy of 06/20/2016 orders to [facility]. 07/19/2016-Scott - [facility] states pt was seen on 07/14/2016 will there be new orders and is pt to continue to be seen 3xweek. 07/23/2016-Scott - [facility] states he has not gotten orders. I called Scott and he apologized he did have orders and knew pt would be seen 07/26/2016 and would look for new orders again. 07/26/2016-Informed pt of Dr. [name] review of wound culture and wound care orders. Pt states he has an appt today at 3:45pm. Faxed copy of 07/25/2016 orders to [facility].

## 2016-06-03 ENCOUNTER — Encounter: Payer: Medicare Other | Admitting: Podiatry

## 2016-06-06 NOTE — Progress Notes (Signed)
Subjective: Patient presents today status post incision and drainage to the right foot. Date of surgery 05/25/2016. Patient states that he is doing much better. Patient is still currently on doxycycline 100 mg at. Patient has not received home health care dressing changes yet. Patient presents today for further treatment and evaluation  Objective: Today the foot appears to be stable. No infection noted. No cellulitis noted. Large open wound noted to the weightbearing surface of the plantar first MPJ right foot. There is tunneling noted.  Assessment: Status post incision and drainage right foot-complicated  Plan of care: Today the patient was evaluated. Prescription for Bactrim 4 weeks. Culture results were reviewed today. Orders for home health dressing changes placed today. Today dressings were changed. The operative shoe was dispensed. Return to clinic in 2 weeks.

## 2016-06-07 NOTE — Progress Notes (Signed)
This patient was seen today by one of the RNs for a dressing change apparently but I did not evaluate or treat this patient.

## 2016-06-16 ENCOUNTER — Ambulatory Visit (INDEPENDENT_AMBULATORY_CARE_PROVIDER_SITE_OTHER): Payer: Medicare Other | Admitting: Podiatry

## 2016-06-16 DIAGNOSIS — Z9889 Other specified postprocedural states: Secondary | ICD-10-CM

## 2016-06-16 DIAGNOSIS — L97513 Non-pressure chronic ulcer of other part of right foot with necrosis of muscle: Secondary | ICD-10-CM | POA: Diagnosis not present

## 2016-06-16 DIAGNOSIS — I87311 Chronic venous hypertension (idiopathic) with ulcer of right lower extremity: Secondary | ICD-10-CM | POA: Diagnosis not present

## 2016-06-16 DIAGNOSIS — L97919 Non-pressure chronic ulcer of unspecified part of right lower leg with unspecified severity: Principal | ICD-10-CM

## 2016-06-20 NOTE — Progress Notes (Signed)
Subjective:  Patient with a history of incision and drainage to the right foot (DOS: 05/25/2016) presents today for evaluation ulceration(s) to the lower extremitie(s). Patient states that he is improving significantly. Patient along expenses the pain relating to the deep tissue abscess. Patient continues to have a wound to the plantar aspect of the right foot. Nurses are currently coming to the house every other day for wound care. Patient presents today for further treatment and evaluation   Objective/Physical Exam General: The patient is alert and oriented x3 in no acute distress.  Dermatology:  Wound #1 noted to the plantar aspect of the right foot sub-first MPJ measuring [phone] cm (LxWxD).   To the noted ulceration(s), there is no eschar. There is a moderate amount of slough, fibrin, and necrotic tissue noted. Granulation tissue and wound base is red. There is a minimal amount of serosanguineous drainage noted. There is no exposed bone muscle-tendon ligament or joint. There is no malodor. Periwound integrity is intact. Skin is warm, dry and supple bilateral lower extremities.  Vascular: Palpable pedal pulses bilaterally. No edema or erythema noted. Capillary refill within normal limits.  Neurological: Epicritic and protective threshold absent bilaterally.   Musculoskeletal Exam: Range of motion within normal limits to all pedal and ankle joints bilateral. Muscle strength 5/5 in all groups bilateral.   Assessment: #1 status post incision and drainage right foot  #2 wound plantar aspect of the right foot secondary to incision and drainage with secondary healing   Plan of Care:  #1 Patient was evaluated. #2 medically necessary excisional debridement including subcutaneous tissue was performed using a tissue nipper and a chisel blade. Excisional debridement of all the necrotic nonviable tissue down to healthy bleeding viable tissue was performed with post-debridement measurements same  as pre-. #3 the wound was cleansed and dry sterile dressing applied. #4 today we are going to modify the home health wound care orders. Pack Prisma with wound every other day. #5 patient is to return to clinic in 2 weeks.   Edrick Kins, DPM [facility] were going to modify the home health dressing changes Dr. Edrick Kins, [address]                                        [place], [place] [address]                Office [phone]  Fax [phone]

## 2016-06-22 NOTE — Telephone Encounter (Signed)
-----   Message from Edrick Kins, DPM sent at 06/20/2016  6:39 PM EST ----- Regarding: Please modify home health dressing change orders Please modify home health dressing change orders to right foot ulceration. 3x/week x 6 weeks.  1. Cleanse with normal saline or wound cleanser. Dry. 2. Pack wound with moistened Puracol Ag collagen dressing with silver into wound.  3. Dressed with nonadherent gauze, 4 x 4 gauze, large Kerlix, Ace wrap.   Thanks, Dr. [name]

## 2016-06-30 ENCOUNTER — Ambulatory Visit (INDEPENDENT_AMBULATORY_CARE_PROVIDER_SITE_OTHER): Payer: Medicare Other | Admitting: Podiatry

## 2016-06-30 ENCOUNTER — Ambulatory Visit: Payer: Medicare Other | Admitting: Podiatry

## 2016-06-30 DIAGNOSIS — Z9889 Other specified postprocedural states: Secondary | ICD-10-CM

## 2016-06-30 DIAGNOSIS — L97519 Non-pressure chronic ulcer of other part of right foot with unspecified severity: Principal | ICD-10-CM

## 2016-06-30 DIAGNOSIS — I83015 Varicose veins of right lower extremity with ulcer other part of foot: Secondary | ICD-10-CM

## 2016-06-30 DIAGNOSIS — L97512 Non-pressure chronic ulcer of other part of right foot with fat layer exposed: Secondary | ICD-10-CM

## 2016-06-30 NOTE — Progress Notes (Signed)
Subjective:  Patient presents today for follow-up evaluation of an ulceration to the right sub-first MPJ. Patient had incision and drainage performed to the right foot on 05/25/2016. Patient presents today for follow-up treatment and evaluation. Patient currently has a nurse come to the house every other day for wound care.   Objective/Physical Exam General: The patient is alert and oriented x3 in no acute distress.  Dermatology:  Wound #1 noted to the [phone] cm (LxWxD).   To the noted ulceration(s), there is no eschar. There is a moderate amount of slough, fibrin, and necrotic tissue noted. Granulation tissue and wound base is red. There is a minimal amount of serosanguineous drainage noted. There is no exposed bone muscle-tendon ligament or joint. There is no malodor. Periwound integrity is intact. Skin is warm, dry and supple bilateral lower extremities.  Vascular: Palpable pedal pulses bilaterally. Mild edema noted. Capillary refill within normal limits. Varicosities noted bilateral lower extremities.   Neurological: Epicritic and protective threshold absent bilaterally.   Musculoskeletal Exam: Range of motion within normal limits to all pedal and ankle joints bilateral. Muscle strength 5/5 in all groups bilateral.   Assessment: #1 ulcer sub-first MPJ right foot secondary to venous insufficiency #2 varicosities bilateral lower extremities  Plan of Care:  #1 Patient was evaluated. #2 medically necessary excisional debridement including subcutaneous tissue was performed using a tissue nipper and a chisel blade. Excisional debridement of all the necrotic nonviable tissue down to healthy bleeding viable tissue was performed with post-debridement measurements same as pre-. #3 the wound was cleansed with normal saline. #4 dressed sterile dressing applied #4 patient is to return to clinic in 2 weeks.   Edrick Kins, DPM [facility]  Dr. Edrick Kins, [address]                                        [place], [place] [address]                Office [phone]  Fax [phone]

## 2016-07-14 ENCOUNTER — Ambulatory Visit (INDEPENDENT_AMBULATORY_CARE_PROVIDER_SITE_OTHER): Payer: Medicare Other | Admitting: Podiatry

## 2016-07-14 DIAGNOSIS — L03031 Cellulitis of right toe: Secondary | ICD-10-CM

## 2016-07-14 DIAGNOSIS — L02611 Cutaneous abscess of right foot: Secondary | ICD-10-CM | POA: Diagnosis not present

## 2016-07-14 DIAGNOSIS — I83015 Varicose veins of right lower extremity with ulcer other part of foot: Secondary | ICD-10-CM | POA: Diagnosis not present

## 2016-07-14 DIAGNOSIS — L97519 Non-pressure chronic ulcer of other part of right foot with unspecified severity: Secondary | ICD-10-CM

## 2016-07-14 DIAGNOSIS — L97512 Non-pressure chronic ulcer of other part of right foot with fat layer exposed: Secondary | ICD-10-CM

## 2016-07-14 MED ORDER — SULFAMETHOXAZOLE-TRIMETHOPRIM 800-160 MG PO TABS: 1.0000 | ORAL_TABLET | Freq: Two times a day (BID) | ORAL | 0 refills | Status: DC

## 2016-07-17 LAB — WOUND CULTURE: Gram Stain: NONE SEEN

## 2016-07-19 ENCOUNTER — Other Ambulatory Visit: Payer: Self-pay | Admitting: Internal Medicine

## 2016-07-21 ENCOUNTER — Ambulatory Visit: Payer: Medicare Other | Admitting: Podiatry

## 2016-07-25 NOTE — Progress Notes (Signed)
   Subjective:  Patient presents today for follow-up evaluation in each plantar aspect of his right foot. He states that this wound has mostly healed and he feels very well. Patient has a new complaint today of drainage and blood coming from the right great toenail avulsion site. He states that the drainage started today.    Objective/Physical Exam General: The patient is alert and oriented x3 in no acute distress.  Dermatology: Open wound noted to the lateral aspect of the right great toenail avulsion site measuring approximately [phone] cm (LxWxD). To the noted ulceration there is a moderate amount of purulent sanguinous drainage noted. There is no malodor. Periwound integrity is slightly erythematous. Wound base granulation tissue is red.   Vascular: Palpable pedal pulses bilaterally. No edema or erythema noted. Capillary refill within normal limits.  Neurological: Epicritic and protective threshold grossly intact bilaterally.   Musculoskeletal Exam: Range of motion within normal limits to all pedal and ankle joints bilateral. Muscle strength 5/5 in all groups bilateral.   Assessment: #1 status post incision and drainage right plantar foot-healed #2 wound with abscess lateral border of the right great toenail avulsion site  Plan of Care:  #1 Patient was evaluated. #2 today deep wound cultures were taken of the toenail avulsion site #3 medically necessary excisional debridement including subcutaneous tissues performed using a curet and tissue nipper. Excisional debridement of all the necrotic nonviable tissue down to healthy bleeding viable tissue was performed with post-debridement measurements and was pre-. #4 prescription for Bactrim DS #5 return to clinic in 2 weeks   Edrick Kins, DPM [facility]  Dr. Edrick Kins, [address]                                        [place], [place] [address]                Office [phone]  Fax [phone]

## 2016-07-25 NOTE — Telephone Encounter (Signed)
Patient can discontinue home health care orders. Patient has a new wound on his great toe. Cultures taken were positive for MRSA. Patient will need to take clindamycin 300mg  TID #30 with 2 refills as per culture results. Also a Rx for gentamicin cream. Wound care for the wound on the great toe is gentamicin cream and a bandaid daily.   Thanks, Dr. [name]

## 2016-07-26 ENCOUNTER — Ambulatory Visit (INDEPENDENT_AMBULATORY_CARE_PROVIDER_SITE_OTHER): Payer: Medicare Other | Admitting: Podiatry

## 2016-07-26 ENCOUNTER — Encounter: Payer: Self-pay | Admitting: Podiatry

## 2016-07-26 DIAGNOSIS — L03031 Cellulitis of right toe: Secondary | ICD-10-CM | POA: Diagnosis not present

## 2016-07-26 DIAGNOSIS — L02611 Cutaneous abscess of right foot: Secondary | ICD-10-CM

## 2016-07-26 MED ORDER — CLINDAMYCIN HCL 300 MG PO CAPS: 300.0000 mg | ORAL_CAPSULE | Freq: Three times a day (TID) | ORAL | 2 refills | Status: DC

## 2016-07-26 MED ORDER — GENTAMICIN SULFATE 0.1 % EX OINT: TOPICAL_OINTMENT | CUTANEOUS | 0 refills | Status: DC

## 2016-07-29 ENCOUNTER — Ambulatory Visit: Payer: Medicare Other | Admitting: Internal Medicine

## 2016-07-30 ENCOUNTER — Ambulatory Visit (INDEPENDENT_AMBULATORY_CARE_PROVIDER_SITE_OTHER): Payer: Medicare Other | Admitting: Nurse Practitioner

## 2016-07-30 ENCOUNTER — Encounter: Payer: Self-pay | Admitting: Nurse Practitioner

## 2016-07-30 VITALS — BP 130/78 | HR 57 | Temp 98.4°F | Ht 73.0 in | Wt 234.8 lb

## 2016-07-30 DIAGNOSIS — J069 Acute upper respiratory infection, unspecified: Secondary | ICD-10-CM

## 2016-07-30 DIAGNOSIS — B9789 Other viral agents as the cause of diseases classified elsewhere: Secondary | ICD-10-CM

## 2016-07-30 MED ORDER — FLUTICASONE PROPIONATE 50 MCG/ACT NA SUSP: 2.0000 | Freq: Every day | NASAL | 0 refills | Status: DC

## 2016-07-30 MED ORDER — BENZONATATE 100 MG PO CAPS: 100.0000 mg | ORAL_CAPSULE | Freq: Three times a day (TID) | ORAL | 0 refills | Status: DC | PRN

## 2016-07-30 MED ORDER — DM-GUAIFENESIN ER 30-600 MG PO TB12: 1.0000 | ORAL_TABLET | Freq: Two times a day (BID) | ORAL | 0 refills | Status: DC | PRN

## 2016-07-30 MED ORDER — SALINE SPRAY 0.65 % NA SOLN: 1.0000 | NASAL | 0 refills | Status: DC | PRN

## 2016-07-30 NOTE — Progress Notes (Signed)
Pre visit review using our clinic review tool, if applicable. No additional management support is needed unless otherwise documented below in the visit note. 

## 2016-07-30 NOTE — Progress Notes (Signed)
Subjective:  Patient ID: [name] [name], male    DOB: [birth year]  Age: 66 y.o. MRN: [id number]  CC: Acute Visit (had a cough for about 2 days)   URI   This is a new problem. The current episode started yesterday. The problem has been gradually improving. There has been no fever. Associated symptoms include congestion, coughing, a plugged ear sensation, rhinorrhea and sinus pain. Pertinent negatives include no abdominal pain, chest pain, diarrhea, dysuria, ear pain, headaches, joint pain, joint swelling, nausea, neck pain, sneezing, sore throat, swollen glands, vomiting or wheezing. He has tried increased fluids (robitussin) for the symptoms. The treatment provided moderate relief.    Outpatient Medications Prior to Visit  Medication Sig Dispense Refill  . amLODipine (NORVASC) 5 MG tablet take 1 tablet by mouth once daily 90 tablet 0  . clindamycin (CLEOCIN) 300 MG capsule Take 1 capsule (300 mg total) by mouth 3 (three) times daily. 30 capsule 2  . doxycycline (VIBRA-TABS) 100 MG tablet Take 1 tablet (100 mg total) by mouth every 12 (twelve) hours. 22 tablet 0  . gentamicin ointment (GARAMYCIN) 0.1 % Apply to right great toe daily and cover with a bandaid. (Patient not taking: Reported on 07/30/2016) 15 g 0  . oxyCODONE-acetaminophen (PERCOCET/ROXICET) 5-325 MG tablet Take 1 tablet by mouth every 6 (six) hours as needed for moderate pain. (Patient not taking: Reported on 07/30/2016) 15 tablet 0  . sulfamethoxazole-trimethoprim (BACTRIM DS,SEPTRA DS) 800-160 MG tablet Take 1 tablet by mouth 2 (two) times daily. (Patient not taking: Reported on 07/30/2016) 28 tablet 0   No facility-administered medications prior to visit.     ROS See HPI  Objective:  BP 130/78 (BP Location: Left Arm, Patient Position: Sitting, Cuff Size: Normal)   Pulse (!) 57   Temp 98.4 F (36.9 C) (Oral)   Ht 6\' 1"  (1.854 m)   Wt 234 lb 12 oz (106.5 kg)   SpO2 98%   BMI 30.97 kg/m   BP Readings from Last 3  Encounters:  07/30/16 130/78  05/28/16 (!) 146/82  05/10/16 (!) 155/91    Wt Readings from Last 3 Encounters:  07/30/16 234 lb 12 oz (106.5 kg)  05/24/16 235 lb (106.6 kg)  08/12/15 232 lb (105.2 kg)    Physical Exam  Constitutional: He is oriented to person, place, and time. No distress.  HENT:  Right Ear: Tympanic membrane, external ear and ear canal normal.  Left Ear: Tympanic membrane and ear canal normal.  Nose: Mucosal edema and rhinorrhea present. Right sinus exhibits no maxillary sinus tenderness and no frontal sinus tenderness. Left sinus exhibits no maxillary sinus tenderness and no frontal sinus tenderness.  Mouth/Throat: Uvula is midline. Posterior oropharyngeal erythema present. No oropharyngeal exudate.  Eyes: No scleral icterus.  Neck: Normal range of motion. Neck supple.  Cardiovascular: Normal rate and regular rhythm.   Pulmonary/Chest: Effort normal and breath sounds normal.  Musculoskeletal: He exhibits no edema.  Lymphadenopathy:    He has no cervical adenopathy.  Neurological: He is alert and oriented to person, place, and time.  Vitals reviewed.   Lab Results  Component Value Date   WBC 7.4 05/27/2016   HGB 13.3 05/27/2016   HCT 39.4 05/27/2016   PLT 121 (L) 05/27/2016   GLUCOSE 95 05/28/2016   CHOL 254 (H) 07/31/2015   TRIG 166.0 (H) 07/31/2015   HDL 35.60 (L) 07/31/2015   LDLCALC 185 (H) 07/31/2015   ALT 30 05/25/2016   AST 27 05/25/2016   NA 139  05/28/2016   K 3.8 05/28/2016   CL 104 05/28/2016   CREATININE 1.58 (H) 05/28/2016   BUN 16 05/28/2016   CO2 26 05/28/2016   TSH 1.35 07/31/2015   PSA 20.08 (H) 07/31/2015   HGBA1C 5.5 07/31/2015    Dg Foot Complete Right  Result Date: 05/24/2016 CLINICAL DATA:  Infection on the plantar surface of the right foot at the first MTP joint of unknown duration. EXAM: RIGHT FOOT COMPLETE - 3+ VIEW COMPARISON:  None. FINDINGS: Soft tissue swelling is seen about the first MTP joint. No soft tissue gas or  radiopaque foreign body is identified. No bony destructive change is seen. Moderate first MTP osteoarthritis is noted. No fracture or dislocation. IMPRESSION: Soft tissue swelling about the first MTP joint. Negative for evidence of osteomyelitis. Moderate first MTP osteoarthritis. Electronically Signed   By: Inge Rise M.D.   On: 05/24/2016 21:54    Assessment & Plan:   Darik was seen today for acute visit.  Diagnoses and all orders for this visit:  Viral URI with cough -     dextromethorphan-guaiFENesin (MUCINEX DM) 30-600 MG 12hr tablet; Take 1 tablet by mouth 2 (two) times daily as needed for cough. -     sodium chloride (OCEAN) 0.65 % SOLN nasal spray; Place 1 spray into both nostrils as needed for congestion. -     fluticasone (FLONASE) 50 MCG/ACT nasal spray; Place 2 sprays into both nostrils daily. -     benzonatate (TESSALON) 100 MG capsule; Take 1 capsule (100 mg total) by mouth 3 (three) times daily as needed for cough.   I have discontinued Mr. Albaugh's oxyCODONE-acetaminophen, doxycycline, sulfamethoxazole-trimethoprim, and gentamicin ointment. I am also having him start on dextromethorphan-guaiFENesin, sodium chloride, fluticasone, and benzonatate. Additionally, I am having him maintain his amLODipine and clindamycin.  Meds ordered this encounter  Medications  . dextromethorphan-guaiFENesin (MUCINEX DM) 30-600 MG 12hr tablet    Sig: Take 1 tablet by mouth 2 (two) times daily as needed for cough.    Dispense:  14 tablet    Refill:  0    Order Specific Question:   Supervising Provider    Answer:   Cassandria Anger [[id number]]  . sodium chloride (OCEAN) 0.65 % SOLN nasal spray    Sig: Place 1 spray into both nostrils as needed for congestion.    Dispense:  15 mL    Refill:  0    Order Specific Question:   Supervising Provider    Answer:   Cassandria Anger [[id number]]  . fluticasone (FLONASE) 50 MCG/ACT nasal spray    Sig: Place 2 sprays into both nostrils daily.     Dispense:  16 g    Refill:  0    Order Specific Question:   Supervising Provider    Answer:   Cassandria Anger [[id number]]  . benzonatate (TESSALON) 100 MG capsule    Sig: Take 1 capsule (100 mg total) by mouth 3 (three) times daily as needed for cough.    Dispense:  20 capsule    Refill:  0    Order Specific Question:   Supervising Provider    Answer:   Cassandria Anger [[id number]]    Follow-up: Return if symptoms worsen or fail to improve.  Wilfred Lacy, NP

## 2016-07-30 NOTE — Patient Instructions (Signed)
URI Instructions: Flonase and Afrin use: apply 1spray of afrin in each nare, wait 52mins, then apply 2sprays of flonase in each nare. Use both nasal spray consecutively x 3days, then flonase only for at least 14days.  Encourage adequate oral hydration.  Use over-the-counter  "cold" medicines  such as "Tylenol cold" , "Advil cold",  "Mucinex" or" Mucinex DM"  for cough and congestion.  Avoid decongestants if you have high blood pressure. Use" Delsym" or" Robitussin" cough syrup varietis for cough.  You can use plain "Tylenol" or "Advi"l for fever, chills and achyness.   "Common cold" symptoms are usually triggered by a virus.  The antibiotics are usually not necessary. On average, a" viral cold" illness would take 4-7 days to resolve. Please, make an appointment if you are not better or if you're worse.

## 2016-08-01 NOTE — Progress Notes (Signed)
   Subjective:   patient preents today for follow-up evaluation of an incision and drainage to the right plantar foot.also on last visit the patient had an abscess to the lateral border of the right great toe.patient is positive for MRSA on culture results.   Objective/Physical Exam General: The patient is alert and oriented x3 in no acute distress.  Dermatology: both the incision and drainage site as well as the abscess site to theright great toeappears to be healed.everything appears dry and stable with complete reepithelializationof all wounds.no drainage noted.  Vascular: Palpable pedal pulses bilaterally. No edema or erythema noted. Capillary refill within normal limits.  Neurological: Epicritic and protective threshold grossly intact bilaterally.   Musculoskeletal Exam: Range of motion within normal limits to all pedal and ankle joints bilateral. Muscle strength 5/5 in all groups bilateral.   Assessment: #1 status post incision and drainage right plantar foot-healed #2 wound with abscess lateral border of the right great toenail avulsion site - healed  Plan of Care:  #1 Patient was evaluated. #2 culture results were reviewed today.I explained to the patient the seriousness of a MRSA infection. Although the wound appears to be healed, we're going to proceed with oral antibiotics 4 weeks based on culture results #3 overall patient has healed. Return to clinic when necessary  Edrick Kins, DPM [facility]  Dr. Edrick Kins, [address]                                        [place],  [address]                Office [phone]  Fax [phone]

## 2016-08-09 ENCOUNTER — Ambulatory Visit (INDEPENDENT_AMBULATORY_CARE_PROVIDER_SITE_OTHER): Payer: Medicare Other | Admitting: Podiatry

## 2016-08-09 DIAGNOSIS — L03031 Cellulitis of right toe: Secondary | ICD-10-CM

## 2016-08-09 DIAGNOSIS — S91101D Unspecified open wound of right great toe without damage to nail, subsequent encounter: Secondary | ICD-10-CM

## 2016-08-09 DIAGNOSIS — L02611 Cutaneous abscess of right foot: Secondary | ICD-10-CM | POA: Diagnosis not present

## 2016-08-09 MED ORDER — GENTAMICIN SULFATE 0.1 % EX CREA: 1.0000 "application " | TOPICAL_CREAM | Freq: Three times a day (TID) | CUTANEOUS | 1 refills | Status: DC

## 2016-08-17 NOTE — Progress Notes (Signed)
   Subjective:   patient preents today for follow-up evaluation of an incision and drainage to the right plantar foot.also on last visit the patient had an abscess to the lateral border of the right great toe.patient is positive for MRSA on culture results.   Objective/Physical Exam General: The patient is alert and oriented x3 in no acute distress.  Dermatology: There does appear to be reopening of the wound noted to the lateral border of the right great toe. Wound appears to be very superficial. To the noted ulceration there is no eschar. There is a minimal amount of slough fibrin and necrotic tissue noted. Granulation tissue wound base is red. There is a minimal amount of serosanguineous drainage noted. Periwound integrity is intact. No malodor noted.  Vascular: Palpable pedal pulses bilaterally. No edema or erythema noted. Capillary refill within normal limits.  Neurological: Epicritic and protective threshold grossly intact bilaterally.   Musculoskeletal Exam: Range of motion within normal limits to all pedal and ankle joints bilateral. Muscle strength 5/5 in all groups bilateral.   Assessment: #1 status post incision and drainage right plantar foot-healed #2 wound with abscess lateral border of the right great toenail avulsion site - healed  Plan of Care:  #1 Patient was evaluated. #2 prescription for gentamicin cream topical to be applied daily #3 return to clinic as needed. The wound noted to the lateral border of the right great toe appears to be stable with evidence of routine healing. Wound is expected to heal uneventfully.  Edrick Kins, DPM [facility]  Dr. Edrick Kins, [place]                                        [place], [place] [address]                Office [phone]  Fax [phone]

## 2016-08-18 ENCOUNTER — Other Ambulatory Visit: Payer: Self-pay | Admitting: Internal Medicine

## 2016-08-18 NOTE — Telephone Encounter (Signed)
LVm for pt to call back and discuss  

## 2016-08-18 NOTE — Telephone Encounter (Signed)
Please advise, not on pts current med list.  

## 2016-08-18 NOTE — Telephone Encounter (Signed)
Not sure if he is taking.    Last seen over one year ago - needs f/u appt - ok to send rx if he is taking, but needs appt.

## 2016-08-19 NOTE — Telephone Encounter (Signed)
Yes patient is still taking.  Please send script to pharmacy.  Patent now has appt on 3/23.

## 2016-09-10 ENCOUNTER — Encounter: Payer: Self-pay | Admitting: Internal Medicine

## 2016-09-10 ENCOUNTER — Ambulatory Visit (INDEPENDENT_AMBULATORY_CARE_PROVIDER_SITE_OTHER): Payer: Medicare Other | Admitting: Internal Medicine

## 2016-09-10 VITALS — BP 130/84 | HR 56 | Temp 98.2°F | Resp 16 | Wt 238.0 lb

## 2016-09-10 DIAGNOSIS — R739 Hyperglycemia, unspecified: Secondary | ICD-10-CM

## 2016-09-10 DIAGNOSIS — Z125 Encounter for screening for malignant neoplasm of prostate: Secondary | ICD-10-CM

## 2016-09-10 DIAGNOSIS — Z0000 Encounter for general adult medical examination without abnormal findings: Secondary | ICD-10-CM

## 2016-09-10 DIAGNOSIS — I10 Essential (primary) hypertension: Secondary | ICD-10-CM

## 2016-09-10 DIAGNOSIS — Z1211 Encounter for screening for malignant neoplasm of colon: Secondary | ICD-10-CM

## 2016-09-10 DIAGNOSIS — R972 Elevated prostate specific antigen [PSA]: Secondary | ICD-10-CM | POA: Diagnosis not present

## 2016-09-10 DIAGNOSIS — E7800 Pure hypercholesterolemia, unspecified: Secondary | ICD-10-CM

## 2016-09-10 DIAGNOSIS — N189 Chronic kidney disease, unspecified: Secondary | ICD-10-CM | POA: Diagnosis not present

## 2016-09-10 MED ORDER — IRBESARTAN 75 MG PO TABS: 75.0000 mg | ORAL_TABLET | Freq: Every day | ORAL | 3 refills | Status: DC

## 2016-09-10 MED ORDER — AMLODIPINE BESYLATE 5 MG PO TABS: 5.0000 mg | ORAL_TABLET | Freq: Every day | ORAL | 3 refills | Status: DC

## 2016-09-10 NOTE — Assessment & Plan Note (Signed)
Check psa 

## 2016-09-10 NOTE — Progress Notes (Addendum)
Subjective:    Patient ID: [name] [name], male    DOB: Apr 29, 1951, 66 y.o.   MRN: [id number]  HPI He is here for a physical exam.   He has no concerns.  He denies any changes in his history.   He is taking his medication.   His infection in his toe has resolved.  He is no longer following with podiatry.   Medications and allergies reviewed with patient and updated if appropriate.  Patient Active Problem List   Diagnosis Date Noted  . Abscess of right foot 05/27/2016  . CKD (chronic kidney disease) stage 2, GFR 60-89 ml/min 05/26/2016  . Septic arthritis of interphalangeal joint of toe, right ([place]) 05/26/2016  . Cellulitis of great toe, right 05/25/2016  . Essential hypertension 05/25/2016  . Cellulitis of right toe 05/25/2016  . Abscess 05/24/2016  . Hyperlipidemia 08/02/2015  . Elevated PSA 08/02/2015  . Chronic kidney disease 07/31/2015  . Legally blind in right eye, as defined in [place] 07/10/2015    Current Outpatient Prescriptions on File Prior to Visit  Medication Sig Dispense Refill  . amLODipine (NORVASC) 5 MG tablet take 1 tablet by mouth once daily 90 tablet 0  . fluticasone (FLONASE) 50 MCG/ACT nasal spray Place 2 sprays into both nostrils daily. 16 g 0  . gentamicin cream (GARAMYCIN) 0.1 % Apply 1 application topically 3 (three) times daily. 30 g 1  . irbesartan (AVAPRO) 75 MG tablet take 1 tablet by mouth once daily 30 tablet 0  . sodium chloride (OCEAN) 0.65 % SOLN nasal spray Place 1 spray into both nostrils as needed for congestion. 15 mL 0   No current facility-administered medications on file prior to visit.     Past Medical History:  Diagnosis Date  . Hypertension     Past Surgical History:  Procedure Laterality Date  . INCISION AND DRAINAGE OF WOUND Right 05/25/2016   Procedure: IRRIGATION AND DEBRIDEMENT WOUND RIGHT FOOT;  Surgeon: Edrick Kins, DPM;  Location: [facility];  Service: Podiatry;  Laterality: Right;  . NO PAST SURGERIES      Social  History   Social History  . Marital status: Married    Spouse name: N/A  . Number of children: N/A  . Years of education: N/A   Social History Main Topics  . Smoking status: Never Smoker  . Smokeless tobacco: Never Used  . Alcohol use No  . Drug use: No  . Sexual activity: Not Asked   Other Topics Concern  . None   Social History Narrative  . None    Family History  Problem Relation Age of Onset  . Cancer Mother   . Heart disease Father   . Hypertension Father     Review of Systems  Constitutional: Negative for appetite change, chills, diaphoresis, fatigue, fever and unexpected weight change.  Eyes: Negative for visual disturbance.  Respiratory: Negative for cough, shortness of breath and wheezing.   Cardiovascular: Negative for chest pain, palpitations and leg swelling.  Gastrointestinal: Negative for abdominal pain, blood in stool, constipation, diarrhea and nausea.       No gerd  Genitourinary: Negative for difficulty urinating, dysuria and hematuria.  Musculoskeletal: Negative for arthralgias and back pain.  Skin: Negative for color change and rash.  Neurological: Negative for dizziness, light-headedness, numbness and headaches.  Psychiatric/Behavioral: Negative for dysphoric mood. The patient is not nervous/anxious.        Objective:   Vitals:   09/10/16 1519  BP: 130/84  Pulse: (!) 56  Resp: 16  Temp: 98.2 F (36.8 C)   Filed Weights   09/10/16 1519  Weight: 238 lb (108 kg)   Body mass index is 31.4 kg/m.  Wt Readings from Last 3 Encounters:  09/10/16 238 lb (108 kg)  07/30/16 234 lb 12 oz (106.5 kg)  05/24/16 235 lb (106.6 kg)     Physical Exam Constitutional: He appears well-developed and well-nourished. No distress.  HENT:  Head: Normocephalic and atraumatic.  Right Ear: External ear normal.  Left Ear: External ear normal.  Mouth/Throat: Oropharynx is clear and moist.  Normal ear canals and TM b/l  Eyes: Conjunctivae and EOM are  normal.  Neck: Neck supple. No tracheal deviation present. No thyromegaly present.  No carotid bruit  Cardiovascular: Normal rate, regular rhythm, normal heart sounds and intact distal pulses.   No murmur heard. Pulmonary/Chest: Effort normal and breath sounds normal. No respiratory distress. He has no wheezes. He has no rales.  Abdominal: Soft. Bowel sounds are normal. He exhibits no distension. There is no tenderness.  Genitourinary: deferred  Musculoskeletal: He exhibits no edema.  Lymphadenopathy:    He has no cervical adenopathy.  Skin: Skin is warm and dry. He is not diaphoretic.  Psychiatric: He has a normal mood and affect. His behavior is normal.         Assessment & Plan:   Physical exam: Screening blood work  ordered Immunizations  Deferred flu, tdap up to date Colonoscopy - due  -- will refer to GI Eye exams  Not up to date  - will schedule EKG last done in 2015 Exercise - not regularly - stressed regularly exercise Weight  - advised weight loss Skin   No concerns Substance abuse   none  See Problem List for Assessment and Plan of chronic medical problems.  FU in 6 months

## 2016-09-10 NOTE — Patient Instructions (Signed)
Test(s) ordered today. Your results will be released to [facility] (or called to you) after review, usually within 72hours after test completion. If any changes need to be made, you will be notified at that same time.  All other Health Maintenance issues reviewed.   All recommended immunizations and age-appropriate screenings are up-to-date or discussed.  No immunizations administered today.   Medications reviewed and updated.  No changes recommended at this time.  Your prescription(s) have been submitted to your pharmacy. Please take as directed and contact our office if you believe you are having problem(s) with the medication(s).   Please followup in 6 months    Health Maintenance, Male A healthy lifestyle and preventive care is important for your health and wellness. Ask your health care provider about what schedule of regular examinations is right for you. What should I know about weight and diet?  Eat a Healthy Diet  Eat plenty of vegetables, fruits, whole grains, low-fat dairy products, and lean protein.  Do not eat a lot of foods high in solid fats, added sugars, or salt. Maintain a Healthy Weight  Regular exercise can help you achieve or maintain a healthy weight. You should:  Do at least 150 minutes of exercise each week. The exercise should increase your heart rate and make you sweat (moderate-intensity exercise).  Do strength-training exercises at least twice a week. Watch Your Levels of Cholesterol and Blood Lipids  Have your blood tested for lipids and cholesterol every 5 years starting at 64 years of age. If you are at high risk for heart disease, you should start having your blood tested when you are 64 years old. You may need to have your cholesterol levels checked more often if:  Your lipid or cholesterol levels are high.  You are older than 64 years of age.  You are at high risk for heart disease. What should I know about cancer screening? Many types of  cancers can be detected early and may often be prevented. Lung Cancer  You should be screened every year for lung cancer if:  You are a current smoker who has smoked for at least 30 years.  You are a former smoker who has quit within the past 15 years.  Talk to your health care provider about your screening options, when you should start screening, and how often you should be screened. Colorectal Cancer  Routine colorectal cancer screening usually begins at 64 years of age and should be repeated every 5-10 years until you are 64 years old. You may need to be screened more often if early forms of precancerous polyps or small growths are found. Your health care provider may recommend screening at an earlier age if you have risk factors for colon cancer.  Your health care provider may recommend using home test kits to check for hidden blood in the stool.  A small camera at the end of a tube can be used to examine your colon (sigmoidoscopy or colonoscopy). This checks for the earliest forms of colorectal cancer. Prostate and Testicular Cancer  Depending on your age and overall health, your health care provider may do certain tests to screen for prostate and testicular cancer.  Talk to your health care provider about any symptoms or concerns you have about testicular or prostate cancer. Skin Cancer  Check your skin from head to toe regularly.  Tell your health care provider about any new moles or changes in moles, especially if:  There is a change in a mole's  size, shape, or color.  You have a mole that is larger than a pencil eraser.  Always use sunscreen. Apply sunscreen liberally and repeat throughout the day.  Protect yourself by wearing long sleeves, pants, a wide-brimmed hat, and sunglasses when outside. What should I know about heart disease, diabetes, and high blood pressure?  If you are 22-14 years of age, have your blood pressure checked every 3-5 years. If you are 36 years  of age or older, have your blood pressure checked every year. You should have your blood pressure measured twice-once when you are at a hospital or clinic, and once when you are not at a hospital or clinic. Record the average of the two measurements. To check your blood pressure when you are not at a hospital or clinic, you can use:  An automated blood pressure machine at a pharmacy.  A home blood pressure monitor.  Talk to your health care provider about your target blood pressure.  If you are between 55-35 years old, ask your health care provider if you should take aspirin to prevent heart disease.  Have regular diabetes screenings by checking your fasting blood sugar level.  If you are at a normal weight and have a low risk for diabetes, have this test once every three years after the age of 61.  If you are overweight and have a high risk for diabetes, consider being tested at a younger age or more often.  A one-time screening for abdominal aortic aneurysm (AAA) by ultrasound is recommended for men aged 80-75 years who are current or former smokers. What should I know about preventing infection? Hepatitis B  If you have a higher risk for hepatitis B, you should be screened for this virus. Talk with your health care provider to find out if you are at risk for hepatitis B infection. Hepatitis C  Blood testing is recommended for:  Everyone born from [birth year] through 1963.  Anyone with known risk factors for hepatitis C. Sexually Transmitted Diseases (STDs)  You should be screened each year for STDs including gonorrhea and chlamydia if:  You are sexually active and are younger than 64 years of age.  You are older than 64 years of age and your health care provider tells you that you are at risk for this type of infection.  Your sexual activity has changed since you were last screened and you are at an increased risk for chlamydia or gonorrhea. Ask your health care provider if you are at  risk.  Talk with your health care provider about whether you are at high risk of being infected with HIV. Your health care provider may recommend a prescription medicine to help prevent HIV infection. What else can I do?  Schedule regular health, dental, and eye exams.  Stay current with your vaccines (immunizations).  Do not use any tobacco products, such as cigarettes, chewing tobacco, and e-cigarettes. If you need help quitting, ask your health care provider.  Limit alcohol intake to no more than 2 drinks per day. One drink equals 12 ounces of beer, 5 ounces of wine, or 1 ounces of hard liquor.  Do not use street drugs.  Do not share needles.  Ask your health care provider for help if you need support or information about quitting drugs.  Tell your health care provider if you often feel depressed.  Tell your health care provider if you have ever been abused or do not feel safe at home. This information is not intended  to replace advice given to you by your health care provider. Make sure you discuss any questions you have with your health care provider. Document Released: 12/04/2007 Document Revised: 02/04/2016 Document Reviewed: 03/11/2015 Elsevier Interactive Patient Education  2017 [employer].

## 2016-09-10 NOTE — Assessment & Plan Note (Signed)
BP well controlled Current regimen effective and well tolerated Continue current medications at current doses  

## 2016-09-10 NOTE — Assessment & Plan Note (Signed)
cmp

## 2016-09-10 NOTE — Assessment & Plan Note (Signed)
Check lipid panel  Regular exercise and healthy diet encouraged  

## 2016-09-10 NOTE — Progress Notes (Signed)
Pre visit review using our clinic review tool, if applicable. No additional management support is needed unless otherwise documented below in the visit note. 

## 2016-11-19 ENCOUNTER — Encounter: Payer: Self-pay | Admitting: Internal Medicine

## 2017-01-31 ENCOUNTER — Ambulatory Visit (INDEPENDENT_AMBULATORY_CARE_PROVIDER_SITE_OTHER): Payer: Medicare Other | Admitting: Podiatry

## 2017-01-31 DIAGNOSIS — L84 Corns and callosities: Secondary | ICD-10-CM

## 2017-01-31 NOTE — Progress Notes (Signed)
   Subjective: Patient presents to the office today for chief complaint of painful callus lesions of the feet. Patient states that the pain is ongoing and is affecting their ability to ambulate without pain. Patient presents today for further treatment and evaluation.  Objective:  Physical Exam General: Alert and oriented x3 in no acute distress  Dermatology: Hyperkeratotic lesion present on the plantar aspect of the first MPJ right foot. Pain on palpation with a central nucleated core noted.  Skin is warm, dry and supple bilateral lower extremities. Negative for open lesions or macerations.  Vascular: Palpable pedal pulses bilaterally. No edema or erythema noted. Capillary refill within normal limits.  Neurological: Epicritic and protective threshold grossly intact bilaterally.   Musculoskeletal Exam: Pain on palpation at the keratotic lesion noted. Range of motion within normal limits bilateral. Muscle strength 5/5 in all groups bilateral.  Assessment: #1 pre-ulcerative callus lesion sub-first MPJ right foot   Plan of Care:  #1 Patient evaluated #2 Excisional debridement of keratoic lesion using a chisel blade was performed without incident.  #3 Treated area(s) with Salinocaine and dressed with light dressing. #4 Patient is to return to the clinic PRN.   Edrick Kins, DPM [facility]  Dr. Edrick Kins, [address]                                        [place],  [address]                Office [phone]  Fax [phone]

## 2017-02-11 ENCOUNTER — Encounter (HOSPITAL_COMMUNITY): Payer: Self-pay | Admitting: Emergency Medicine

## 2017-02-11 ENCOUNTER — Inpatient Hospital Stay (HOSPITAL_COMMUNITY)
Admission: EM | Admit: 2017-02-11 | Discharge: 2017-02-13 | DRG: 494 | Disposition: A | Discharge status: Home / Self Care | Payer: No Typology Code available for payment source | Attending: Orthopaedic Surgery | Admitting: Orthopaedic Surgery

## 2017-02-11 ENCOUNTER — Emergency Department (HOSPITAL_COMMUNITY): Payer: No Typology Code available for payment source

## 2017-02-11 ENCOUNTER — Other Ambulatory Visit: Payer: Self-pay

## 2017-02-11 DIAGNOSIS — S8251XA Displaced fracture of medial malleolus of right tibia, initial encounter for closed fracture: Secondary | ICD-10-CM | POA: Diagnosis not present

## 2017-02-11 DIAGNOSIS — E785 Hyperlipidemia, unspecified: Secondary | ICD-10-CM | POA: Diagnosis present

## 2017-02-11 DIAGNOSIS — I129 Hypertensive chronic kidney disease with stage 1 through stage 4 chronic kidney disease, or unspecified chronic kidney disease: Secondary | ICD-10-CM | POA: Diagnosis present

## 2017-02-11 DIAGNOSIS — S82209A Unspecified fracture of shaft of unspecified tibia, initial encounter for closed fracture: Secondary | ICD-10-CM | POA: Diagnosis present

## 2017-02-11 DIAGNOSIS — M79661 Pain in right lower leg: Secondary | ICD-10-CM | POA: Diagnosis present

## 2017-02-11 DIAGNOSIS — S82201A Unspecified fracture of shaft of right tibia, initial encounter for closed fracture: Secondary | ICD-10-CM | POA: Diagnosis not present

## 2017-02-11 DIAGNOSIS — Z9889 Other specified postprocedural states: Secondary | ICD-10-CM

## 2017-02-11 DIAGNOSIS — Y92008 Other place in unspecified non-institutional (private) residence as the place of occurrence of the external cause: Secondary | ICD-10-CM

## 2017-02-11 DIAGNOSIS — S82301A Unspecified fracture of lower end of right tibia, initial encounter for closed fracture: Secondary | ICD-10-CM

## 2017-02-11 DIAGNOSIS — S8254XA Nondisplaced fracture of medial malleolus of right tibia, initial encounter for closed fracture: Principal | ICD-10-CM | POA: Diagnosis present

## 2017-02-11 DIAGNOSIS — N189 Chronic kidney disease, unspecified: Secondary | ICD-10-CM | POA: Diagnosis present

## 2017-02-11 DIAGNOSIS — Z419 Encounter for procedure for purposes other than remedying health state, unspecified: Secondary | ICD-10-CM

## 2017-02-11 LAB — BASIC METABOLIC PANEL
ANION GAP: 7 (ref 5–15)
BUN: 25 mg/dL — ABNORMAL HIGH (ref 6–20)
CALCIUM: 9.2 mg/dL (ref 8.9–10.3)
CO2: 24 mmol/L (ref 22–32)
Chloride: 111 mmol/L (ref 101–111)
Creatinine, Ser: 1.48 mg/dL — ABNORMAL HIGH (ref 0.61–1.24)
GFR calc Af Amer: 56 mL/min — ABNORMAL LOW (ref >60)
GFR, EST NON AFRICAN AMERICAN: 49 mL/min — AB (ref >60)
GLUCOSE: 104 mg/dL — AB (ref 65–99)
Potassium: 3.7 mmol/L (ref 3.5–5.1)
SODIUM: 142 mmol/L (ref 135–145)

## 2017-02-11 LAB — CBC WITH DIFFERENTIAL/PLATELET
BASOS ABS: 0 10*3/uL (ref 0.0–0.1)
BASOS PCT: 0 %
Eosinophils Absolute: 0.1 10*3/uL (ref 0.0–0.7)
Eosinophils Relative: 1 %
HEMATOCRIT: 36.8 % — AB (ref 39.0–52.0)
Hemoglobin: 12.9 g/dL — ABNORMAL LOW (ref 13.0–17.0)
Lymphocytes Relative: 22 %
Lymphs Abs: 1.6 10*3/uL (ref 0.7–4.0)
MCH: 31.2 pg (ref 26.0–34.0)
MCHC: 35.1 g/dL (ref 30.0–36.0)
MCV: 89.1 fL (ref 78.0–100.0)
MONO ABS: 0.3 10*3/uL (ref 0.1–1.0)
Monocytes Relative: 3 %
NEUTROS ABS: 5.4 10*3/uL (ref 1.7–7.7)
Neutrophils Relative %: 74 %
PLATELETS: 122 10*3/uL — AB (ref 150–400)
RBC: 4.13 MIL/uL — ABNORMAL LOW (ref 4.22–5.81)
RDW: 14 % (ref 11.5–15.5)
WBC: 7.4 10*3/uL (ref 4.0–10.5)

## 2017-02-11 MED ORDER — MORPHINE SULFATE (PF) 2 MG/ML IV SOLN
4.0000 mg | Freq: Once | INTRAVENOUS | Status: AC
  Administered 2017-02-11: 4 mg via INTRAVENOUS
  Filled 2017-02-11: qty 2

## 2017-02-11 MED ORDER — MORPHINE SULFATE (PF) 2 MG/ML IV SOLN: 4.0000 mg | Freq: Once | INTRAVENOUS | Status: DC

## 2017-02-11 MED ORDER — FENTANYL CITRATE (PF) 100 MCG/2ML IJ SOLN
50.0000 ug | Freq: Once | INTRAMUSCULAR | Status: AC
  Administered 2017-02-11: 50 ug via INTRAVENOUS
  Filled 2017-02-11: qty 2
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
## 2017-02-11 NOTE — ED Notes (Signed)
[facility] called. 

## 2017-02-11 NOTE — ED Notes (Signed)
RN Maylon Cos to collect labs with iv start

## 2017-02-11 NOTE — ED Notes (Signed)
Phone report given to [facility]. °

## 2017-02-12 ENCOUNTER — Encounter (HOSPITAL_COMMUNITY)
Admission: EM | Disposition: A | Discharge status: Home / Self Care | Payer: Self-pay | Source: Home / Self Care | Attending: Orthopaedic Surgery

## 2017-02-12 ENCOUNTER — Inpatient Hospital Stay (HOSPITAL_COMMUNITY): Payer: No Typology Code available for payment source

## 2017-02-12 ENCOUNTER — Inpatient Hospital Stay (HOSPITAL_COMMUNITY): Payer: No Typology Code available for payment source | Admitting: Certified Registered Nurse Anesthetist

## 2017-02-12 ENCOUNTER — Encounter (HOSPITAL_COMMUNITY): Payer: Self-pay | Admitting: Certified Registered Nurse Anesthetist

## 2017-02-12 DIAGNOSIS — S8251XA Displaced fracture of medial malleolus of right tibia, initial encounter for closed fracture: Secondary | ICD-10-CM

## 2017-02-12 DIAGNOSIS — S8254XA Nondisplaced fracture of medial malleolus of right tibia, initial encounter for closed fracture: Secondary | ICD-10-CM

## 2017-02-12 DIAGNOSIS — S82201A Unspecified fracture of shaft of right tibia, initial encounter for closed fracture: Secondary | ICD-10-CM

## 2017-02-12 HISTORY — PX: TIBIA IM NAIL INSERTION: SHX2516

## 2017-02-12 LAB — SURGICAL PCR SCREEN
MRSA, PCR: NEGATIVE
Staphylococcus aureus: POSITIVE — AB

## 2017-02-12 SURGERY — INSERTION, INTRAMEDULLARY ROD, TIBIA
Anesthesia: General | Site: Leg Lower | Laterality: Right

## 2017-02-12 MED ORDER — ONDANSETRON HCL 4 MG PO TABS: 4.0000 mg | ORAL_TABLET | Freq: Three times a day (TID) | ORAL | 0 refills | Status: DC | PRN

## 2017-02-12 MED ORDER — GLYCOPYRROLATE 0.2 MG/ML IV SOSY
PREFILLED_SYRINGE | INTRAVENOUS | Status: DC | PRN
  Administered 2017-02-12: 0.6 mg via INTRAVENOUS

## 2017-02-12 MED ORDER — ACETAMINOPHEN 650 MG RE SUPP: 650.0000 mg | Freq: Four times a day (QID) | RECTAL | Status: DC | PRN

## 2017-02-12 MED ORDER — PROPOFOL 10 MG/ML IV BOLUS
INTRAVENOUS | Status: AC
  Filled 2017-02-12: qty 20

## 2017-02-12 MED ORDER — ZINC SULFATE 220 (50 ZN) MG PO CAPS: 220.0000 mg | ORAL_CAPSULE | Freq: Every day | ORAL | 0 refills | Status: DC

## 2017-02-12 MED ORDER — METOCLOPRAMIDE HCL 5 MG PO TABS: 5.0000 mg | ORAL_TABLET | Freq: Three times a day (TID) | ORAL | Status: DC | PRN

## 2017-02-12 MED ORDER — PROMETHAZINE HCL 25 MG PO TABS: 25.0000 mg | ORAL_TABLET | Freq: Four times a day (QID) | ORAL | 1 refills | Status: DC | PRN

## 2017-02-12 MED ORDER — SENNOSIDES-DOCUSATE SODIUM 8.6-50 MG PO TABS: 1.0000 | ORAL_TABLET | Freq: Every evening | ORAL | 1 refills | Status: DC | PRN

## 2017-02-12 MED ORDER — DEXAMETHASONE SODIUM PHOSPHATE 10 MG/ML IJ SOLN
INTRAMUSCULAR | Status: AC
  Filled 2017-02-12: qty 1

## 2017-02-12 MED ORDER — NEOSTIGMINE METHYLSULFATE 5 MG/5ML IV SOSY
PREFILLED_SYRINGE | INTRAVENOUS | Status: AC
  Filled 2017-02-12: qty 5

## 2017-02-12 MED ORDER — 0.9 % SODIUM CHLORIDE (POUR BTL) OPTIME
TOPICAL | Status: DC | PRN
  Administered 2017-02-12: 1000 mL

## 2017-02-12 MED ORDER — SODIUM CHLORIDE 0.9 % IV SOLN
INTRAVENOUS | Status: DC
  Administered 2017-02-12: 125 mL via INTRAVENOUS

## 2017-02-12 MED ORDER — MAGNESIUM CITRATE PO SOLN: 1.0000 | Freq: Once | ORAL | Status: DC | PRN

## 2017-02-12 MED ORDER — PROPOFOL 10 MG/ML IV BOLUS
INTRAVENOUS | Status: DC | PRN
  Administered 2017-02-12: 100 mg via INTRAVENOUS
  Administered 2017-02-12: 50 mg via INTRAVENOUS

## 2017-02-12 MED ORDER — FENTANYL CITRATE (PF) 100 MCG/2ML IJ SOLN
INTRAMUSCULAR | Status: DC | PRN
  Administered 2017-02-12 (×2): 100 ug via INTRAVENOUS
  Administered 2017-02-12: 50 ug via INTRAVENOUS
  Administered 2017-02-12: 25 ug via INTRAVENOUS

## 2017-02-12 MED ORDER — EPHEDRINE SULFATE 50 MG/ML IJ SOLN
INTRAMUSCULAR | Status: DC | PRN
  Administered 2017-02-12: 10 mg via INTRAVENOUS

## 2017-02-12 MED ORDER — METOCLOPRAMIDE HCL 5 MG/ML IJ SOLN: 5.0000 mg | Freq: Three times a day (TID) | INTRAMUSCULAR | Status: DC | PRN

## 2017-02-12 MED ORDER — ROCURONIUM BROMIDE 10 MG/ML (PF) SYRINGE
PREFILLED_SYRINGE | INTRAVENOUS | Status: AC
  Filled 2017-02-12: qty 5

## 2017-02-12 MED ORDER — CALCIUM CARBONATE-VITAMIN D 500-200 MG-UNIT PO TABS: 1.0000 | ORAL_TABLET | Freq: Three times a day (TID) | ORAL | 12 refills | Status: DC

## 2017-02-12 MED ORDER — DEXAMETHASONE SODIUM PHOSPHATE 10 MG/ML IJ SOLN
INTRAMUSCULAR | Status: DC | PRN
  Administered 2017-02-12: 10 mg via INTRAVENOUS

## 2017-02-12 MED ORDER — CHLORHEXIDINE GLUCONATE CLOTH 2 % EX PADS
6.0000 | MEDICATED_PAD | Freq: Every day | CUTANEOUS | Status: DC
  Administered 2017-02-12 – 2017-02-13 (×2): 6 via TOPICAL

## 2017-02-12 MED ORDER — LIDOCAINE 2% (20 MG/ML) 5 ML SYRINGE
INTRAMUSCULAR | Status: AC
  Filled 2017-02-12: qty 5

## 2017-02-12 MED ORDER — IRBESARTAN 150 MG PO TABS
75.0000 mg | ORAL_TABLET | Freq: Every day | ORAL | Status: DC
  Administered 2017-02-12 – 2017-02-13 (×2): 75 mg via ORAL
  Filled 2017-02-12 (×2): qty 1

## 2017-02-12 MED ORDER — OXYCODONE HCL ER 10 MG PO T12A: 10.0000 mg | EXTENDED_RELEASE_TABLET | Freq: Two times a day (BID) | ORAL | 0 refills | Status: DC

## 2017-02-12 MED ORDER — ASPIRIN EC 325 MG PO TBEC: 325.0000 mg | DELAYED_RELEASE_TABLET | Freq: Two times a day (BID) | ORAL | 0 refills | Status: DC

## 2017-02-12 MED ORDER — ASPIRIN EC 325 MG PO TBEC
325.0000 mg | DELAYED_RELEASE_TABLET | Freq: Two times a day (BID) | ORAL | Status: DC
  Administered 2017-02-12 – 2017-02-13 (×2): 325 mg via ORAL
  Filled 2017-02-12 (×2): qty 1

## 2017-02-12 MED ORDER — MIDAZOLAM HCL 5 MG/5ML IJ SOLN
INTRAMUSCULAR | Status: DC | PRN
  Administered 2017-02-12: 2 mg via INTRAVENOUS

## 2017-02-12 MED ORDER — METHOCARBAMOL 500 MG PO TABS: 500.0000 mg | ORAL_TABLET | Freq: Four times a day (QID) | ORAL | Status: DC | PRN

## 2017-02-12 MED ORDER — LACTATED RINGERS IV SOLN
INTRAVENOUS | Status: DC
  Administered 2017-02-12 (×3): via INTRAVENOUS

## 2017-02-12 MED ORDER — EPHEDRINE 5 MG/ML INJ
INTRAVENOUS | Status: AC
  Filled 2017-02-12: qty 10

## 2017-02-12 MED ORDER — NEOSTIGMINE METHYLSULFATE 5 MG/5ML IV SOSY
PREFILLED_SYRINGE | INTRAVENOUS | Status: DC | PRN
  Administered 2017-02-12: 4 mg via INTRAVENOUS

## 2017-02-12 MED ORDER — MIDAZOLAM HCL 2 MG/2ML IJ SOLN
INTRAMUSCULAR | Status: AC
  Filled 2017-02-12: qty 2

## 2017-02-12 MED ORDER — ONDANSETRON HCL 4 MG/2ML IJ SOLN: 4.0000 mg | Freq: Once | INTRAMUSCULAR | Status: DC | PRN

## 2017-02-12 MED ORDER — ONDANSETRON HCL 4 MG/2ML IJ SOLN
INTRAMUSCULAR | Status: AC
  Filled 2017-02-12: qty 2

## 2017-02-12 MED ORDER — MEPERIDINE HCL 25 MG/ML IJ SOLN: 6.2500 mg | INTRAMUSCULAR | Status: DC | PRN

## 2017-02-12 MED ORDER — ONDANSETRON HCL 4 MG PO TABS: 4.0000 mg | ORAL_TABLET | Freq: Four times a day (QID) | ORAL | Status: DC | PRN

## 2017-02-12 MED ORDER — POLYETHYLENE GLYCOL 3350 17 G PO PACK: 17.0000 g | PACK | Freq: Every day | ORAL | Status: DC | PRN

## 2017-02-12 MED ORDER — SORBITOL 70 % SOLN: 30.0000 mL | Freq: Every day | Status: DC | PRN

## 2017-02-12 MED ORDER — METHOCARBAMOL 750 MG PO TABS: 750.0000 mg | ORAL_TABLET | Freq: Two times a day (BID) | ORAL | 0 refills | Status: DC | PRN

## 2017-02-12 MED ORDER — ACETAMINOPHEN 325 MG PO TABS
650.0000 mg | ORAL_TABLET | Freq: Four times a day (QID) | ORAL | Status: DC | PRN
  Administered 2017-02-12: 650 mg via ORAL
  Filled 2017-02-12: qty 2

## 2017-02-12 MED ORDER — FENTANYL CITRATE (PF) 250 MCG/5ML IJ SOLN
INTRAMUSCULAR | Status: AC
  Filled 2017-02-12: qty 5

## 2017-02-12 MED ORDER — MUPIROCIN 2 % EX OINT
1.0000 "application " | TOPICAL_OINTMENT | Freq: Two times a day (BID) | CUTANEOUS | Status: DC
  Administered 2017-02-12 – 2017-02-13 (×2): 1 via NASAL
  Filled 2017-02-12: qty 22

## 2017-02-12 MED ORDER — ONDANSETRON HCL 4 MG/2ML IJ SOLN
INTRAMUSCULAR | Status: DC | PRN
  Administered 2017-02-12: 4 mg via INTRAVENOUS

## 2017-02-12 MED ORDER — ROCURONIUM BROMIDE 10 MG/ML (PF) SYRINGE
PREFILLED_SYRINGE | INTRAVENOUS | Status: DC | PRN
  Administered 2017-02-12: 20 mg via INTRAVENOUS
  Administered 2017-02-12: 50 mg via INTRAVENOUS

## 2017-02-12 MED ORDER — AMLODIPINE BESYLATE 5 MG PO TABS
5.0000 mg | ORAL_TABLET | Freq: Every day | ORAL | Status: DC
  Administered 2017-02-12 – 2017-02-13 (×2): 5 mg via ORAL
  Filled 2017-02-12 (×2): qty 1

## 2017-02-12 MED ORDER — CEFAZOLIN SODIUM-DEXTROSE 2-4 GM/100ML-% IV SOLN
2.0000 g | Freq: Four times a day (QID) | INTRAVENOUS | Status: AC
  Administered 2017-02-12 – 2017-02-13 (×3): 2 g via INTRAVENOUS
  Filled 2017-02-12 (×3): qty 100

## 2017-02-12 MED ORDER — LIDOCAINE 2% (20 MG/ML) 5 ML SYRINGE
INTRAMUSCULAR | Status: DC | PRN
  Administered 2017-02-12: 100 mg via INTRAVENOUS

## 2017-02-12 MED ORDER — OXYCODONE HCL 5 MG PO TABS: 5.0000 mg | ORAL_TABLET | ORAL | Status: DC | PRN

## 2017-02-12 MED ORDER — METHOCARBAMOL 1000 MG/10ML IJ SOLN
500.0000 mg | Freq: Four times a day (QID) | INTRAMUSCULAR | Status: DC | PRN
  Filled 2017-02-12: qty 5

## 2017-02-12 MED ORDER — CEFAZOLIN SODIUM-DEXTROSE 2-4 GM/100ML-% IV SOLN
2.0000 g | Freq: Once | INTRAVENOUS | Status: AC
  Administered 2017-02-12: 2 g via INTRAVENOUS
  Filled 2017-02-12: qty 100

## 2017-02-12 MED ORDER — DIPHENHYDRAMINE HCL 12.5 MG/5ML PO ELIX: 25.0000 mg | ORAL_SOLUTION | ORAL | Status: DC | PRN

## 2017-02-12 MED ORDER — MORPHINE SULFATE (PF) 4 MG/ML IV SOLN: 1.0000 mg | INTRAVENOUS | Status: DC | PRN

## 2017-02-12 MED ORDER — HYDROMORPHONE HCL 1 MG/ML IJ SOLN: 0.2500 mg | INTRAMUSCULAR | Status: DC | PRN

## 2017-02-12 MED ORDER — ONDANSETRON HCL 4 MG/2ML IJ SOLN: 4.0000 mg | Freq: Four times a day (QID) | INTRAMUSCULAR | Status: DC | PRN

## 2017-02-12 MED ORDER — OXYCODONE HCL 5 MG PO TABS: 5.0000 mg | ORAL_TABLET | ORAL | 0 refills | Status: DC | PRN

## 2017-02-12 SURGICAL SUPPLY — 74 items
BANDAGE ACE 4X5 VEL STRL LF (GAUZE/BANDAGES/DRESSINGS) ×3 IMPLANT
BANDAGE ACE 6X5 VEL STRL LF (GAUZE/BANDAGES/DRESSINGS) ×3 IMPLANT
BANDAGE ELASTIC 4 VELCRO ST LF (GAUZE/BANDAGES/DRESSINGS) ×2 IMPLANT
BANDAGE ELASTIC 6 VELCRO ST LF (GAUZE/BANDAGES/DRESSINGS) ×2 IMPLANT
BANDAGE ESMARK 6X9 LF (GAUZE/BANDAGES/DRESSINGS) ×1 IMPLANT
BIT DRILL AO GAMMA 4.2X130 (BIT) ×2 IMPLANT
BIT DRILL AO GAMMA 4.2X180 (BIT) ×2 IMPLANT
BIT DRILL AO GAMMA 4.2X340 (BIT) ×2 IMPLANT
BIT DRILL CANN 2.7 (BIT) ×2
BIT DRILL CANN 2.7MM (BIT) ×1
BIT DRILL SRG 2.7XCANN AO CPLG (BIT) IMPLANT
BIT DRL SRG 2.7XCANN AO CPLNG (BIT) ×1
BNDG CMPR 9X6 STRL LF SNTH (GAUZE/BANDAGES/DRESSINGS) ×1
BNDG ESMARK 6X9 LF (GAUZE/BANDAGES/DRESSINGS) ×3
COVER MAYO STAND STRL (DRAPES) ×3 IMPLANT
COVER SURGICAL LIGHT HANDLE (MISCELLANEOUS) ×3 IMPLANT
CUFF TOURNIQUET SINGLE 34IN LL (TOURNIQUET CUFF) ×3 IMPLANT
DRAPE C-ARM 42X72 X-RAY (DRAPES) ×3 IMPLANT
DRAPE C-ARMOR (DRAPES) ×3 IMPLANT
DRAPE HALF SHEET 40X57 (DRAPES) ×3 IMPLANT
DRAPE IMP U-DRAPE 54X76 (DRAPES) ×3 IMPLANT
DRAPE POUCH INSTRU U-SHP 10X18 (DRAPES) ×3 IMPLANT
DRAPE U-SHAPE 47X51 STRL (DRAPES) ×3 IMPLANT
DRAPE UTILITY XL STRL (DRAPES) ×6 IMPLANT
DRSG MEPILEX BORDER 4X4 (GAUZE/BANDAGES/DRESSINGS) ×2 IMPLANT
DRSG MEPILEX BORDER 4X8 (GAUZE/BANDAGES/DRESSINGS) ×2 IMPLANT
DURAPREP 26ML APPLICATOR (WOUND CARE) ×3 IMPLANT
ELECT CAUTERY BLADE 6.4 (BLADE) ×3 IMPLANT
ELECT REM PT RETURN 9FT ADLT (ELECTROSURGICAL) ×3
ELECTRODE REM PT RTRN 9FT ADLT (ELECTROSURGICAL) ×1 IMPLANT
FACESHIELD WRAPAROUND (MASK) IMPLANT
FACESHIELD WRAPAROUND OR TEAM (MASK) IMPLANT
GAUZE SPONGE 4X4 12PLY STRL (GAUZE/BANDAGES/DRESSINGS) ×3 IMPLANT
GAUZE XEROFORM 1X8 LF (GAUZE/BANDAGES/DRESSINGS) ×6 IMPLANT
GAUZE XEROFORM 5X9 LF (GAUZE/BANDAGES/DRESSINGS) ×2 IMPLANT
GLOVE SKINSENSE NS SZ7.5 (GLOVE) ×8
GLOVE SKINSENSE STRL SZ7.5 (GLOVE) ×4 IMPLANT
GOWN STRL REIN XL XLG (GOWN DISPOSABLE) ×3 IMPLANT
GUIDEROD T2 3X1000 (ROD) ×2 IMPLANT
GUIDEWIRE GAMMA (WIRE) ×4 IMPLANT
K-WIRE FIXATION 3X285 COATED (WIRE) ×6
K-WIRE ORTHOPEDIC 1.4X150L (WIRE) ×6
KIT BASIN OR (CUSTOM PROCEDURE TRAY) ×3 IMPLANT
KWIRE FIXATION 3X285 COATED (WIRE) IMPLANT
KWIRE ORTHOPEDIC 1.4X150L (WIRE) IMPLANT
MANIFOLD NEPTUNE II (INSTRUMENTS) ×3 IMPLANT
NAIL ELAS INSERT SLV SPI 8-11 (MISCELLANEOUS) ×2 IMPLANT
NAIL TIBIAL LOCK STD 11X390MM (Nail) ×2 IMPLANT
NS IRRIG 1000ML POUR BTL (IV SOLUTION) ×3 IMPLANT
PACK TOTAL JOINT (CUSTOM PROCEDURE TRAY) ×3 IMPLANT
PACK UNIVERSAL I (CUSTOM PROCEDURE TRAY) ×3 IMPLANT
PAD CAST 4YDX4 CTTN HI CHSV (CAST SUPPLIES) ×2 IMPLANT
PADDING CAST COTTON 4X4 STRL (CAST SUPPLIES) ×6
PADDING CAST COTTON 6X4 STRL (CAST SUPPLIES) ×2 IMPLANT
PADDING CAST SYNTHETIC 4 (CAST SUPPLIES) ×2
PADDING CAST SYNTHETIC 4X4 STR (CAST SUPPLIES) IMPLANT
SCREW CANNULATED 4.0X36MM FT (Screw) ×2 IMPLANT
SCREW CANNULATED 4.0X36MM PT (Screw) ×2 IMPLANT
SCREW LOCK 5X75 FT T2 (Screw) ×2 IMPLANT
SCREW LOCKING T2 F/T  5MMX45MM (Screw) ×2 IMPLANT
SCREW LOCKING T2 F/T  5MMX55MM (Screw) ×2 IMPLANT
SCREW LOCKING T2 F/T  5X42.5MM (Screw) ×2 IMPLANT
SCREW LOCKING T2 F/T 5MMX45MM (Screw) IMPLANT
SCREW LOCKING T2 F/T 5MMX55MM (Screw) IMPLANT
SCREW LOCKING T2 F/T 5X42.5MM (Screw) IMPLANT
SHAFT REAMER  8X885MM (MISCELLANEOUS) ×2
SHAFT REAMER 8X885MM (MISCELLANEOUS) IMPLANT
STAPLER SKIN PROX WIDE 3.9 (STAPLE) ×3 IMPLANT
SUT VIC AB 0 CT1 27 (SUTURE) ×6
SUT VIC AB 0 CT1 27XBRD ANTBC (SUTURE) ×2 IMPLANT
SUT VIC AB 2-0 CT1 27 (SUTURE) ×6
SUT VIC AB 2-0 CT1 TAPERPNT 27 (SUTURE) ×2 IMPLANT
TOWEL OR 17X26 10 PK STRL BLUE (TOWEL DISPOSABLE) ×6 IMPLANT
WATER STERILE IRR 1000ML POUR (IV SOLUTION) ×3 IMPLANT

## 2017-02-12 NOTE — Progress Notes (Signed)
Placed on 2 l O2 - sats decrease to 89-91 % on RA

## 2017-02-12 NOTE — H&P (Signed)

## 2017-02-12 NOTE — Anesthesia Preprocedure Evaluation (Signed)
Anesthesia Evaluation  Patient identified by MRN, date of birth, ID band Patient awake    Reviewed: Allergy & Precautions, NPO status , Patient's Chart, lab work & pertinent test results  Airway        Dental   Pulmonary           Cardiovascular hypertension, Pt. on medications      Neuro/Psych    GI/Hepatic   Endo/Other    Renal/GU      Musculoskeletal   Abdominal   Peds  Hematology   Anesthesia Other Findings   Reproductive/Obstetrics                             Anesthesia Physical Anesthesia Plan Anesthesia Quick Evaluation

## 2017-02-12 NOTE — Anesthesia Postprocedure Evaluation (Signed)
Anesthesia Post Note  Patient: [name] [name]  Procedure(s) Performed: Procedure(s) (LRB): INTRAMEDULLARY (IM) NAIL TIBIAL, RIGHT (Right)     Patient location during evaluation: PACU Anesthesia Type: General Level of consciousness: awake and alert Pain management: pain level controlled Vital Signs Assessment: post-procedure vital signs reviewed and stable Respiratory status: spontaneous breathing, nonlabored ventilation, respiratory function stable and patient connected to nasal cannula oxygen Cardiovascular status: blood pressure returned to baseline and stable Postop Assessment: no signs of nausea or vomiting Anesthetic complications: no    Last Vitals:  Vitals:   02/12/17 1012 02/12/17 1015  BP: 139/83   Pulse: (!) 41 (!) 45  Resp: 14 17  Temp:    SpO2: 98% 93%    Last Pain:  Vitals:   02/12/17 0504  TempSrc: Oral  PainSc:                  [name] DAVID
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
## 2017-02-12 NOTE — Discharge Instructions (Signed)
Postoperative instructions: ° °Weightbearing instructions: non weight bearing ° °Keep your dressing and/or splint clean and dry at all times.  You can remove your dressing on post-operative day #3 and change with a dry/sterile dressing or Band-Aids as needed thereafter.   ° °Incision instructions:  Do not soak your incision for 3 weeks after surgery.  If the incision gets wet, pat dry and do not scrub the incision. ° °Pain control:  You have been given a prescription to be taken as directed for post-operative pain control.  In addition, elevate the operative extremity above the heart at all times to prevent swelling and throbbing pain. ° °Take over-the-counter Colace, 100mg by mouth twice a day while taking narcotic pain medications to help prevent constipation. ° °Follow up appointments: °1) 10-14 days for suture removal and wound check. °2) Dr. Arnetia Bronk as scheduled. ° ° ------------------------------------------------------------------------------------------------------------- ° °After Surgery Pain Control: ° °After your surgery, post-surgical discomfort or pain is likely. This discomfort can last several days to a few weeks. At certain times of the day your discomfort may be more intense.  °Did you receive a nerve block?  °A nerve block can provide pain relief for one hour to two days after your surgery. As long as the nerve block is working, you will experience little or no sensation in the area the surgeon operated on.  °As the nerve block wears off, you will begin to experience pain or discomfort. It is very important that you begin taking your prescribed pain medication before the nerve block fully wears off. Treating your pain at the first sign of the block wearing off will ensure your pain is better controlled and more tolerable when full-sensation returns. Do not wait until the pain is intolerable, as the medicine will be less effective. It is better to treat pain in advance than to try and catch up.  °General  Anesthesia:  °If you did not receive a nerve block during your surgery, you will need to start taking your pain medication shortly after your surgery and should continue to do so as prescribed by your surgeon.  °Pain Medication:  °Most commonly we prescribe Vicodin and Percocet for post-operative pain. Both of these medications contain a combination of acetaminophen (Tylenol®) and a narcotic to help control pain.  °· It takes between 30 and 45 minutes before pain medication starts to work. It is important to take your medication before your pain level gets too intense.  °· Nausea is a common side effect of many pain medications. You will want to eat something before taking your pain medicine to help prevent nausea.  °· If you are taking a prescription pain medication that contains acetaminophen, we recommend that you do not take additional over the counter acetaminophen (Tylenol®).  °Other pain relieving options:  °· Using a cold pack to ice the affected area a few times a day (15 to 20 minutes at a time) can help to relieve pain, reduce swelling and bruising.  °· Elevation of the affected area can also help to reduce pain and swelling. ° ° ° °

## 2017-02-12 NOTE — Anesthesia Preprocedure Evaluation (Signed)
Anesthesia Evaluation  Patient identified by MRN, date of birth, ID band Patient awake    Reviewed: Allergy & Precautions, NPO status , Patient's Chart, lab work & pertinent test results  Airway Mallampati: I  TM Distance: >3 FB Neck ROM: Full    Dental   Pulmonary    Pulmonary exam normal        Cardiovascular hypertension, Pt. on medications Normal cardiovascular exam     Neuro/Psych    GI/Hepatic   Endo/Other    Renal/GU Renal InsufficiencyRenal disease     Musculoskeletal   Abdominal   Peds  Hematology   Anesthesia Other Findings   Reproductive/Obstetrics                             Anesthesia Physical Anesthesia Plan  ASA: II  Anesthesia Plan: General   Post-op Pain Management:    Induction: Intravenous  PONV Risk Score and Plan: 2 and Ondansetron, Dexamethasone and Treatment may vary due to age or medical condition  Airway Management Planned: Oral ETT  Additional Equipment:   Intra-op Plan:   Post-operative Plan: Extubation in OR  Informed Consent: I have reviewed the patients History and Physical, chart, labs and discussed the procedure including the risks, benefits and alternatives for the proposed anesthesia with the patient or authorized representative who has indicated his/her understanding and acceptance.     Plan Discussed with: CRNA and Surgeon  Anesthesia Plan Comments:         Anesthesia Quick Evaluation

## 2017-02-12 NOTE — Transfer of Care (Signed)
Immediate Anesthesia Transfer of Care Note  Patient: [name] [name]  Procedure(s) Performed: Procedure(s): INTRAMEDULLARY (IM) NAIL TIBIAL, RIGHT (Right)  Patient Location: PACU  Anesthesia Type:General  Level of Consciousness: awake, alert  and oriented  Airway & Oxygen Therapy: Patient Spontanous Breathing and Patient connected to nasal cannula oxygen  Post-op Assessment: Report given to RN and Post -op Vital signs reviewed and stable  Post vital signs: Reviewed and stable  Last Vitals:  Vitals:   02/12/17 0504 02/12/17 0942  BP: (!) 152/79 120/73  Pulse: 64 (!) 51  Resp: 18 13  Temp: 36.4 C   SpO2: 97% 100%    Last Pain:  Vitals:   02/12/17 0504  TempSrc: Oral  PainSc:          Complications: No apparent anesthesia complications

## 2017-02-13 LAB — BASIC METABOLIC PANEL
Anion gap: 9 (ref 5–15)
BUN: 25 mg/dL — AB (ref 6–20)
CALCIUM: 8.8 mg/dL — AB (ref 8.9–10.3)
CO2: 25 mmol/L (ref 22–32)
CREATININE: 1.59 mg/dL — AB (ref 0.61–1.24)
Chloride: 104 mmol/L (ref 101–111)
GFR calc Af Amer: 52 mL/min — ABNORMAL LOW (ref >60)
GFR, EST NON AFRICAN AMERICAN: 45 mL/min — AB (ref >60)
GLUCOSE: 126 mg/dL — AB (ref 65–99)
Potassium: 3.7 mmol/L (ref 3.5–5.1)
SODIUM: 138 mmol/L (ref 135–145)

## 2017-02-13 LAB — CBC
HEMATOCRIT: 33.5 % — AB (ref 39.0–52.0)
Hemoglobin: 11.4 g/dL — ABNORMAL LOW (ref 13.0–17.0)
MCH: 30.7 pg (ref 26.0–34.0)
MCHC: 34 g/dL (ref 30.0–36.0)
MCV: 90.3 fL (ref 78.0–100.0)
PLATELETS: 111 10*3/uL — AB (ref 150–400)
RBC: 3.71 MIL/uL — ABNORMAL LOW (ref 4.22–5.81)
RDW: 14.2 % (ref 11.5–15.5)
WBC: 8.3 10*3/uL (ref 4.0–10.5)

## 2017-02-13 NOTE — Progress Notes (Signed)
Reviewed discharge paperwork and medications with patient with full understanding
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
## 2017-02-13 NOTE — Progress Notes (Signed)
   Subjective:  Patient reports pain as mild.    Objective:   VITALS:   Vitals:   02/12/17 1900 02/12/17 2300 02/13/17 0500 02/13/17 0801  BP: (!) 149/76 128/75 125/80 127/71  Pulse: 64 72 (!) 52 (!) 54  Resp: 18 18 18 18   Temp: 98.3 F (36.8 C) 99 F (37.2 C) 98 F (36.7 C) 98.1 F (36.7 C)  TempSrc: Oral Oral Oral Oral  SpO2: 97% 98% 97% 96%  Weight:      Height:        Neurologically intact Neurovascular intact Sensation intact distally Intact pulses distally Dorsiflexion/Plantar flexion intact Incision: dressing C/D/I and no drainage No cellulitis present Compartment soft   Lab Results  Component Value Date   WBC 8.3 02/13/2017   HGB 11.4 (L) 02/13/2017   HCT 33.5 (L) 02/13/2017   MCV 90.3 02/13/2017   PLT 111 (L) 02/13/2017     Assessment/Plan:  1 Day Post-Op   - Expected postop acute blood loss anemia - will monitor for symptoms - Up with PT/OT - DVT ppx - SCDs, ambulation, aspirin - NWB operative extremity - Pain control - Discharge planning - home today  Eduard Roux 02/13/2017, 11:06 AM [phone]
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
## 2017-02-13 NOTE — Progress Notes (Signed)
Equipment recieved

## 2017-02-13 NOTE — Care Management Note (Signed)
Case Management Note  Patient Details  Name: Shakai Dolley MRN: [id number] Date of Birth: September 29, 1950  Subjective/Objective: 66 y.o. M to be discharged with DME ordered from [facility].                   Action/Plan:CM will sign off for now but will be available should additional discharge needs arise or disposition change.    Expected Discharge Date:  02/13/17               Expected Discharge Plan:  Home/Self Care  In-House Referral:  NA  Discharge planning Services  CM Consult  Post Acute Care Choice:  Durable Medical Equipment Choice offered to:     DME Arranged:  3-N-1, Walker rolling DME Agency:  [place]:    [facility] Agency:     Status of Service:  Completed, signed off  If discussed at [place] of Stay Meetings, dates discussed:    Additional Comments:  Delrae Sawyers, RN 02/13/2017, 2:25 PM
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
## 2017-02-15 ENCOUNTER — Encounter (HOSPITAL_COMMUNITY): Payer: Self-pay | Admitting: Orthopaedic Surgery

## 2017-02-25 ENCOUNTER — Ambulatory Visit (INDEPENDENT_AMBULATORY_CARE_PROVIDER_SITE_OTHER): Payer: Self-pay

## 2017-02-25 ENCOUNTER — Encounter (INDEPENDENT_AMBULATORY_CARE_PROVIDER_SITE_OTHER): Payer: Self-pay | Admitting: Orthopaedic Surgery

## 2017-02-25 ENCOUNTER — Ambulatory Visit (INDEPENDENT_AMBULATORY_CARE_PROVIDER_SITE_OTHER): Payer: Medicare Other | Admitting: Orthopaedic Surgery

## 2017-02-25 DIAGNOSIS — S8254XA Nondisplaced fracture of medial malleolus of right tibia, initial encounter for closed fracture: Secondary | ICD-10-CM | POA: Diagnosis not present

## 2017-02-25 DIAGNOSIS — S82241D Displaced spiral fracture of shaft of right tibia, subsequent encounter for closed fracture with routine healing: Secondary | ICD-10-CM
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
## 2017-02-28 ENCOUNTER — Telehealth (INDEPENDENT_AMBULATORY_CARE_PROVIDER_SITE_OTHER): Payer: Self-pay | Admitting: Orthopaedic Surgery

## 2017-02-28 NOTE — Telephone Encounter (Signed)
See message below °

## 2017-02-28 NOTE — Telephone Encounter (Signed)
yes

## 2017-02-28 NOTE — Telephone Encounter (Signed)
Gretchen (PT) with [facility] called needing verbal orders for [facility] (PT) for 3 wk 1 and Twice a wk for 1 wk. The number to contact Elzie Rings is [phone]. She asked to leave her a message if she does not answer.

## 2017-03-01 NOTE — Telephone Encounter (Signed)
Called to advise.  

## 2017-03-24 ENCOUNTER — Ambulatory Visit (INDEPENDENT_AMBULATORY_CARE_PROVIDER_SITE_OTHER): Payer: Medicare Other | Admitting: Orthopaedic Surgery

## 2017-03-24 ENCOUNTER — Encounter (INDEPENDENT_AMBULATORY_CARE_PROVIDER_SITE_OTHER): Payer: Self-pay | Admitting: Orthopaedic Surgery

## 2017-03-24 ENCOUNTER — Ambulatory Visit (INDEPENDENT_AMBULATORY_CARE_PROVIDER_SITE_OTHER): Payer: Medicare Other

## 2017-03-24 DIAGNOSIS — S8254XA Nondisplaced fracture of medial malleolus of right tibia, initial encounter for closed fracture: Secondary | ICD-10-CM | POA: Diagnosis not present

## 2017-03-24 NOTE — Progress Notes (Signed)
Patient is 6 weeks status post right tibia IM nail. He complains of some mild pain and soreness. He is doing home health physical therapy twice a week. Overall he is progressing well and happy today.  Surgical scars are well healed without any signs of infection. He has minimal swelling. Medial malleolus is slightly tender. X-rays show stable fixation and evidence of early healing.  Patient is now allowed to weight-bear as tolerated in a cam walker. Continue with physical therapy. Follow-up in 6 weeks with repeat 2 view x-rays of the right tib-fib.

## 2017-03-28 ENCOUNTER — Telehealth (INDEPENDENT_AMBULATORY_CARE_PROVIDER_SITE_OTHER): Payer: Self-pay

## 2017-03-28 NOTE — Telephone Encounter (Signed)
Leodis Sias with [facility] would like verbal orders to extend PT for patient for 2 x week for 2 weeks and 1 x week for 1 week.  Cb# is [phone].  Please advise.

## 2017-03-28 NOTE — Telephone Encounter (Signed)
Is this okay?

## 2017-03-28 NOTE — Telephone Encounter (Signed)
yes

## 2017-03-29 NOTE — Telephone Encounter (Signed)
Called [name] [name] to advise ok for orders per Dr Erlinda Hong

## 2017-04-05 MED ORDER — PROPOFOL 10 MG/ML IV BOLUS
INTRAVENOUS | Status: AC
  Filled 2017-04-05: qty 40

## 2017-04-05 MED ORDER — ONDANSETRON HCL 4 MG/2ML IJ SOLN
INTRAMUSCULAR | Status: AC
  Filled 2017-04-05: qty 2

## 2017-04-05 MED ORDER — LIDOCAINE 2% (20 MG/ML) 5 ML SYRINGE
INTRAMUSCULAR | Status: AC
  Filled 2017-04-05: qty 5

## 2017-05-05 ENCOUNTER — Ambulatory Visit (INDEPENDENT_AMBULATORY_CARE_PROVIDER_SITE_OTHER): Payer: Medicare Other | Admitting: Orthopaedic Surgery

## 2017-05-05 ENCOUNTER — Ambulatory Visit (INDEPENDENT_AMBULATORY_CARE_PROVIDER_SITE_OTHER): Payer: Medicare Other

## 2017-05-05 DIAGNOSIS — S82241D Displaced spiral fracture of shaft of right tibia, subsequent encounter for closed fracture with routine healing: Secondary | ICD-10-CM

## 2017-05-05 DIAGNOSIS — M1611 Unilateral primary osteoarthritis, right hip: Secondary | ICD-10-CM

## 2017-05-05 NOTE — Progress Notes (Signed)
Patient is 82 days status post intramedullary fixation of tibial fracture.  He is doing well overall.  He is a cane.He has been very active.  Denies any significant pain.  Surgical scars are fully healed.  Minimal swelling.  No pain or limp with walking.  X-rays show stable fixation with evidence of bony consolidation.  At this point he made his Cam walker.  Activity as tolerated.  Follow-up in 2 months with repeat 2 view x-rays of the right tib-fib.

## 2017-07-08 ENCOUNTER — Encounter (INDEPENDENT_AMBULATORY_CARE_PROVIDER_SITE_OTHER): Payer: Self-pay | Admitting: Orthopaedic Surgery

## 2017-07-08 ENCOUNTER — Ambulatory Visit (INDEPENDENT_AMBULATORY_CARE_PROVIDER_SITE_OTHER): Payer: Medicare Other | Admitting: Orthopaedic Surgery

## 2017-07-08 ENCOUNTER — Ambulatory Visit (INDEPENDENT_AMBULATORY_CARE_PROVIDER_SITE_OTHER): Payer: Medicare Other

## 2017-07-08 DIAGNOSIS — S82241D Displaced spiral fracture of shaft of right tibia, subsequent encounter for closed fracture with routine healing: Secondary | ICD-10-CM | POA: Diagnosis not present

## 2017-07-08 NOTE — Progress Notes (Signed)
   Office Visit Note   Patient: [name] [name]           Date of Birth: 1951/03/18           MRN: [id number] Visit Date: 07/08/2017              Requested by: [name], MD [place], [place] [address] PCP: [name], MD   Assessment & Plan: Visit Diagnoses:  1. Closed displaced spiral fracture of shaft of right tibia with routine healing, subsequent encounter     Plan: Impression a 67 year old gentleman with a healed tibia fracture.  At this point he is released to full activity.  He has demonstrated radiographic and clinical healing.  Follow-up as needed. Total face to face encounter time was greater than 25 minutes and over half of this time was spent in counseling and/or coordination of care.  Follow-Up Instructions: Return if symptoms worsen or fail to improve.   Orders:  Orders Placed This Encounter  Procedures  . XR Tibia/Fibula Right   No orders of the defined types were placed in this encounter.     Procedures: No procedures performed   Clinical Data: No additional findings.   Subjective: Chief Complaint  Patient presents with  . Right Leg - Pain    Patient is 5 months status post intramedullary fixation of right tibia fracture.  He is doing well.  Denies any pain.  Not taking any medicines.  He is exercising at times.    Review of Systems   Objective: Vital Signs: There were no vitals taken for this visit.  Physical Exam  Ortho Exam Right lower leg exam shows fully healed surgical scars.  There is no swelling or tenderness.  He has full range of motion of the ankle and the knee. Specialty Comments:  No specialty comments available.  Imaging: Xr Tibia/fibula Right  Result Date: 07/08/2017 Healed tibia fracture    PMFS History: Patient Active Problem List   Diagnosis Date Noted  . Nondisplaced fracture of medial malleolus of right tibia, initial encounter for closed fracture 02/12/2017  . Tibia fracture 02/11/2017  .  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 Essential hypertension 05/25/2016  . Hyperlipidemia 08/02/2015  . Elevated PSA 08/02/2015  . Chronic kidney disease 07/31/2015  . Legally blind in right eye, as defined in [place] 07/10/2015   Past Medical History:  Diagnosis Date  . Hypertension     Family History  Problem Relation Age of Onset  . Cancer Mother   . Heart disease Father   . Hypertension Father     Past Surgical History:  Procedure Laterality Date  . INCISION AND DRAINAGE OF WOUND Right 05/25/2016   Procedure: IRRIGATION AND DEBRIDEMENT WOUND RIGHT FOOT;  Surgeon: Edrick Kins, DPM;  Location: [facility];  Service: Podiatry;  Laterality: Right;  . NO PAST SURGERIES    . TIBIA IM NAIL INSERTION Right 02/12/2017   Procedure: INTRAMEDULLARY (IM) NAIL TIBIAL, RIGHT;  Surgeon: Leandrew Koyanagi, MD;  Location: [facility];  Service: Orthopedics;  Laterality: Right;   Social History   Occupational History  . Not on file  Tobacco Use  . Smoking status: Never Smoker  . Smokeless tobacco: Never Used  Substance and Sexual Activity  . Alcohol use: No  . Drug use: No  . Sexual activity: Not on file

## 2017-07-20 ENCOUNTER — Telehealth: Payer: Self-pay | Admitting: Internal Medicine

## 2017-07-26 NOTE — Telephone Encounter (Signed)
Erroneous phone encounter

## 2017-09-21 ENCOUNTER — Telehealth: Payer: Self-pay | Admitting: Internal Medicine

## 2017-09-21 MED ORDER — AMLODIPINE BESYLATE 5 MG PO TABS: 5.0000 mg | ORAL_TABLET | Freq: Every day | ORAL | 0 refills | Status: DC

## 2017-09-21 NOTE — Telephone Encounter (Signed)
Request refill for norvasc.  Prescription expired on  09/10/17.  LOV  09/10/16  Provider: Billey Gosling, MD  Pharmacy:  [facility] [phone]  [place]  Please review.

## 2017-09-21 NOTE — Telephone Encounter (Signed)
Copied from [place] [id number]. Topic: Inquiry >> Sep 21, 2017  9:27 AM Pricilla Handler wrote: Reason for CRM: Patient's wife West Carbo called requesting an Urgent Refill of AmLODipine (NORVASC) 5 MG tablet. Patient has been out for four days. Patient's wife stated that the pharmacy sent three different requests for refills in the last week, but has received no response. Patient's wife would like a call back this morning asap from someone in the office or [facility] triage nurse. Patient's preferred pharmacy is [facility] [phone] - [place], [place] - [address] AT [phone] (Phone)  [phone] (Fax).       Thank You!!!

## 2017-09-30 ENCOUNTER — Other Ambulatory Visit: Payer: Self-pay

## 2017-09-30 MED ORDER — IRBESARTAN 75 MG PO TABS: 75.0000 mg | ORAL_TABLET | Freq: Every day | ORAL | 0 refills | Status: DC

## 2017-11-01 ENCOUNTER — Other Ambulatory Visit: Payer: Self-pay | Admitting: Internal Medicine

## 2017-11-05 DIAGNOSIS — R739 Hyperglycemia, unspecified: Secondary | ICD-10-CM | POA: Insufficient documentation

## 2017-11-05 NOTE — Patient Instructions (Addendum)
Your BP should ideally be less than 140/90.  Call [facility] to schedule your colonoscopy at [phone].    Test(s) ordered today. Your results will be released to [place] (or called to you) after review, usually within 72hours after test completion. If any changes need to be made, you will be notified at that same time.   Medications reviewed and updated.  No changes recommended at this time.  Your prescription(s) have been submitted to your pharmacy. Please take as directed and contact our office if you believe you are having problem(s) with the medication(s).    Please followup in 6 months

## 2017-11-05 NOTE — Progress Notes (Signed)
Subjective:    Patient ID: [name] [name], male    DOB: [birth year], 67 y.o.   MRN: [id number]  HPI The patient is here for follow up.  Hypertension: He is taking his medication daily. He is compliant with a low sodium diet.  He denies chest pain, palpitations, edema, shortness of breath and regular headaches. He is not exercising regularly.  He does not monitor his blood pressure at home.    CKD:  He drinks a lot of water every day.  He takes Tylenol as needed.  He does not take any NSAIDs.  He does not monitor his BP at home.    hyperglycemia:  He is compliant with a low sugar/carbohydrate diet.  He is active, but no regular exercise.  Hyperlipidemia: He is not taking any medication. He is compliant with a low fat/cholesterol diet. He is not exercising regularly.     Elevated PSA:  Last psa was done in 2017.  He was referred to urology, but it looks like he never saw them.  He thinks he was seen but it was years ago.  He denies difficulty urinating, weak stream, dysuria and hematuria.     Medications and allergies reviewed with patient and updated if appropriate.  Patient Active Problem List   Diagnosis Date Noted  . Hyperglycemia 11/05/2017  . Nondisplaced fracture of medial malleolus of right tibia, initial encounter for closed fracture 02/12/2017  . Tibia fracture 02/11/2017  . Septic arthritis of interphalangeal joint of toe, right ([place]) 05/26/2016  . Essential hypertension 05/25/2016  . Hyperlipidemia 08/02/2015  . Elevated PSA 08/02/2015  . Chronic kidney disease 07/31/2015  . Legally blind in right eye, as defined in [place] 07/10/2015    No current outpatient medications on file prior to visit.   No current facility-administered medications on file prior to visit.     Past Medical History:  Diagnosis Date  . Hypertension     Past Surgical History:  Procedure Laterality Date  . INCISION AND DRAINAGE OF WOUND Right 05/25/2016   Procedure: IRRIGATION AND  DEBRIDEMENT WOUND RIGHT FOOT;  Surgeon: Edrick Kins, DPM;  Location: [facility];  Service: Podiatry;  Laterality: Right;  . NO PAST SURGERIES    . TIBIA IM NAIL INSERTION Right 02/12/2017   Procedure: INTRAMEDULLARY (IM) NAIL TIBIAL, RIGHT;  Surgeon: Leandrew Koyanagi, MD;  Location: [facility];  Service: Orthopedics;  Laterality: Right;    Social History   Socioeconomic History  . Marital status: Married    Spouse name: Not on file  . Number of children: Not on file  . Years of education: Not on file  . Highest education level: Not on file  Occupational History  . Not on file  Social Needs  . Financial resource strain: Not on file  . Food insecurity:    Worry: Not on file    Inability: Not on file  . Transportation needs:    Medical: Not on file    Non-medical: Not on file  Tobacco Use  . Smoking status: Never Smoker  . Smokeless tobacco: Never Used  Substance and Sexual Activity  . Alcohol use: No  . Drug use: No  . Sexual activity: Not on file  Lifestyle  . Physical activity:    Days per week: Not on file    Minutes per session: Not on file  . Stress: Not on file  Relationships  . Social connections:    Talks on phone: Not on file  Gets together: Not on file    Attends religious service: Not on file    Active member of club or organization: Not on file    Attends meetings of clubs or organizations: Not on file    Relationship status: Not on file  Other Topics Concern  . Not on file  Social History Narrative  . Not on file    Family History  Problem Relation Age of Onset  . Cancer Mother   . Heart disease Father   . Hypertension Father     Review of Systems  Constitutional: Negative for chills and fever.  Respiratory: Negative for cough, shortness of breath and wheezing.   Cardiovascular: Negative for chest pain, palpitations and leg swelling.  Genitourinary: Negative for difficulty urinating, dysuria and hematuria.  Neurological: Negative for dizziness,  light-headedness and headaches.       Objective:   Vitals:   11/07/17 0900 11/07/17 0926  BP: (!) 150/84 132/82  Pulse: (!) 56   Resp: 16   Temp: 98.2 F (36.8 C)   SpO2: 98%    BP Readings from Last 3 Encounters:  11/07/17 132/82  02/13/17 127/71  09/10/16 130/84   Wt Readings from Last 3 Encounters:  11/07/17 236 lb (107 kg)  02/11/17 238 lb (108 kg)  09/10/16 238 lb (108 kg)   Body mass index is 31.14 kg/m.   Physical Exam    Constitutional: Appears well-developed and well-nourished. No distress.  HENT:  Head: Normocephalic and atraumatic.  Neck: Neck supple. No tracheal deviation present. No thyromegaly present.  No cervical lymphadenopathy Cardiovascular: Normal rate, regular rhythm and normal heart sounds.   No murmur heard. No carotid bruit .  No edema Pulmonary/Chest: Effort normal and breath sounds normal. No respiratory distress. No has no wheezes. No rales.  Skin: Skin is warm and dry. Not diaphoretic.  Psychiatric: Normal mood and affect. Behavior is normal.      Assessment & Plan:    See Problem List for Assessment and Plan of chronic medical problems.

## 2017-11-07 ENCOUNTER — Other Ambulatory Visit (INDEPENDENT_AMBULATORY_CARE_PROVIDER_SITE_OTHER): Payer: Medicare Other

## 2017-11-07 ENCOUNTER — Ambulatory Visit (INDEPENDENT_AMBULATORY_CARE_PROVIDER_SITE_OTHER): Payer: Medicare Other | Admitting: Internal Medicine

## 2017-11-07 ENCOUNTER — Encounter: Payer: Self-pay | Admitting: Internal Medicine

## 2017-11-07 VITALS — BP 132/82 | HR 56 | Temp 98.2°F | Resp 16 | Wt 236.0 lb

## 2017-11-07 DIAGNOSIS — I10 Essential (primary) hypertension: Secondary | ICD-10-CM

## 2017-11-07 DIAGNOSIS — R739 Hyperglycemia, unspecified: Secondary | ICD-10-CM | POA: Diagnosis not present

## 2017-11-07 DIAGNOSIS — N189 Chronic kidney disease, unspecified: Secondary | ICD-10-CM

## 2017-11-07 DIAGNOSIS — E7849 Other hyperlipidemia: Secondary | ICD-10-CM

## 2017-11-07 DIAGNOSIS — R972 Elevated prostate specific antigen [PSA]: Secondary | ICD-10-CM | POA: Diagnosis not present

## 2017-11-07 LAB — PSA, MEDICARE: PSA: 23.09 ng/mL — AB (ref 0.10–4.00)

## 2017-11-07 LAB — COMPREHENSIVE METABOLIC PANEL
ALBUMIN: 4.1 g/dL (ref 3.5–5.2)
ALK PHOS: 76 U/L (ref 39–117)
ALT: 26 U/L (ref 0–53)
AST: 25 U/L (ref 0–37)
BUN: 27 mg/dL — AB (ref 6–23)
CO2: 25 mEq/L (ref 19–32)
Calcium: 9.1 mg/dL (ref 8.4–10.5)
Chloride: 105 mEq/L (ref 96–112)
Creatinine, Ser: 1.47 mg/dL (ref 0.40–1.50)
GFR: 61.95 mL/min (ref >60.00)
Glucose, Bld: 87 mg/dL (ref 70–99)
POTASSIUM: 3.7 meq/L (ref 3.5–5.1)
Sodium: 138 mEq/L (ref 135–145)
TOTAL PROTEIN: 6.9 g/dL (ref 6.0–8.3)
Total Bilirubin: 0.6 mg/dL (ref 0.2–1.2)

## 2017-11-07 LAB — LIPID PANEL
CHOLESTEROL: 245 mg/dL — AB (ref 0–200)
HDL: 31.6 mg/dL — AB (ref >39.00)
NonHDL: 213.21
Total CHOL/HDL Ratio: 8
Triglycerides: 317 mg/dL — ABNORMAL HIGH (ref 0.0–149.0)
VLDL: 63.4 mg/dL — AB (ref 0.0–40.0)

## 2017-11-07 LAB — CBC WITH DIFFERENTIAL/PLATELET
BASOS ABS: 0 10*3/uL (ref 0.0–0.1)
Basophils Relative: 0.7 % (ref 0.0–3.0)
EOS ABS: 0.1 10*3/uL (ref 0.0–0.7)
Eosinophils Relative: 1.2 % (ref 0.0–5.0)
HCT: 41.7 % (ref 39.0–52.0)
Hemoglobin: 14 g/dL (ref 13.0–17.0)
LYMPHS ABS: 1.6 10*3/uL (ref 0.7–4.0)
Lymphocytes Relative: 33.2 % (ref 12.0–46.0)
MCHC: 33.6 g/dL (ref 30.0–36.0)
MCV: 94.3 fl (ref 78.0–100.0)
MONO ABS: 0.4 10*3/uL (ref 0.1–1.0)
Monocytes Relative: 7.9 % (ref 3.0–12.0)
NEUTROS PCT: 57 % (ref 43.0–77.0)
Neutro Abs: 2.7 10*3/uL (ref 1.4–7.7)
Platelets: 100 10*3/uL — ABNORMAL LOW (ref 150.0–400.0)
RBC: 4.43 Mil/uL (ref 4.22–5.81)
RDW: 13.7 % (ref 11.5–15.5)
WBC: 4.8 10*3/uL (ref 4.0–10.5)

## 2017-11-07 LAB — TSH: TSH: 2.56 u[IU]/mL (ref 0.35–4.50)

## 2017-11-07 LAB — LDL CHOLESTEROL, DIRECT: LDL DIRECT: 157 mg/dL

## 2017-11-07 LAB — HEMOGLOBIN A1C: Hgb A1c MFr Bld: 5.5 % (ref 4.6–6.5)

## 2017-11-07 MED ORDER — AMLODIPINE BESYLATE 5 MG PO TABS: 5.0000 mg | ORAL_TABLET | Freq: Every day | ORAL | 1 refills | Status: DC

## 2017-11-07 MED ORDER — IRBESARTAN 75 MG PO TABS: 75.0000 mg | ORAL_TABLET | Freq: Every day | ORAL | 1 refills | Status: DC

## 2017-11-07 NOTE — Assessment & Plan Note (Signed)
A1c

## 2017-11-07 NOTE — Assessment & Plan Note (Signed)
Blood pressure elevated here initially, but improved with rechecking it No change in medication-continue current medications at current doses CMP Continue low-sodium diet Encourage more regular exercise Encouraged weight loss

## 2017-11-07 NOTE — Assessment & Plan Note (Signed)
Drinks water throughout the day Does not take any NSAIDs-Tylenol as needed only CMP

## 2017-11-07 NOTE — Assessment & Plan Note (Signed)
Check lipid panel, CMP, TSH Not currently on any medication Encourage more regular exercise Advised weight loss

## 2017-11-07 NOTE — Assessment & Plan Note (Signed)
PSA elevated in the past-was referred to urology at that time, but was not seen-looks like they tried to contact him Recheck PSA today Asymptomatic-no dysuria, hematuria, weak stream or difficulty urinating If PSA is still elevated will need to see urology

## 2017-11-09 ENCOUNTER — Other Ambulatory Visit: Payer: Self-pay | Admitting: Internal Medicine

## 2017-11-09 DIAGNOSIS — R972 Elevated prostate specific antigen [PSA]: Secondary | ICD-10-CM

## 2017-11-10 ENCOUNTER — Other Ambulatory Visit: Payer: Self-pay | Admitting: Internal Medicine

## 2017-11-10 DIAGNOSIS — E785 Hyperlipidemia, unspecified: Secondary | ICD-10-CM

## 2017-11-10 MED ORDER — ATORVASTATIN CALCIUM 20 MG PO TABS: 20.0000 mg | ORAL_TABLET | Freq: Every day | ORAL | 3 refills | Status: DC

## 2017-12-02 IMAGING — CR DG FOOT COMPLETE 3+V*R*
3 series · 3 of 3 positions shown · non-contrast
Comparison: None.

CLINICAL DATA: Infection on the plantar surface of the right foot
at the first MTP joint of unknown duration.

EXAM:
RIGHT FOOT COMPLETE - 3+ VIEW

[foot ap]
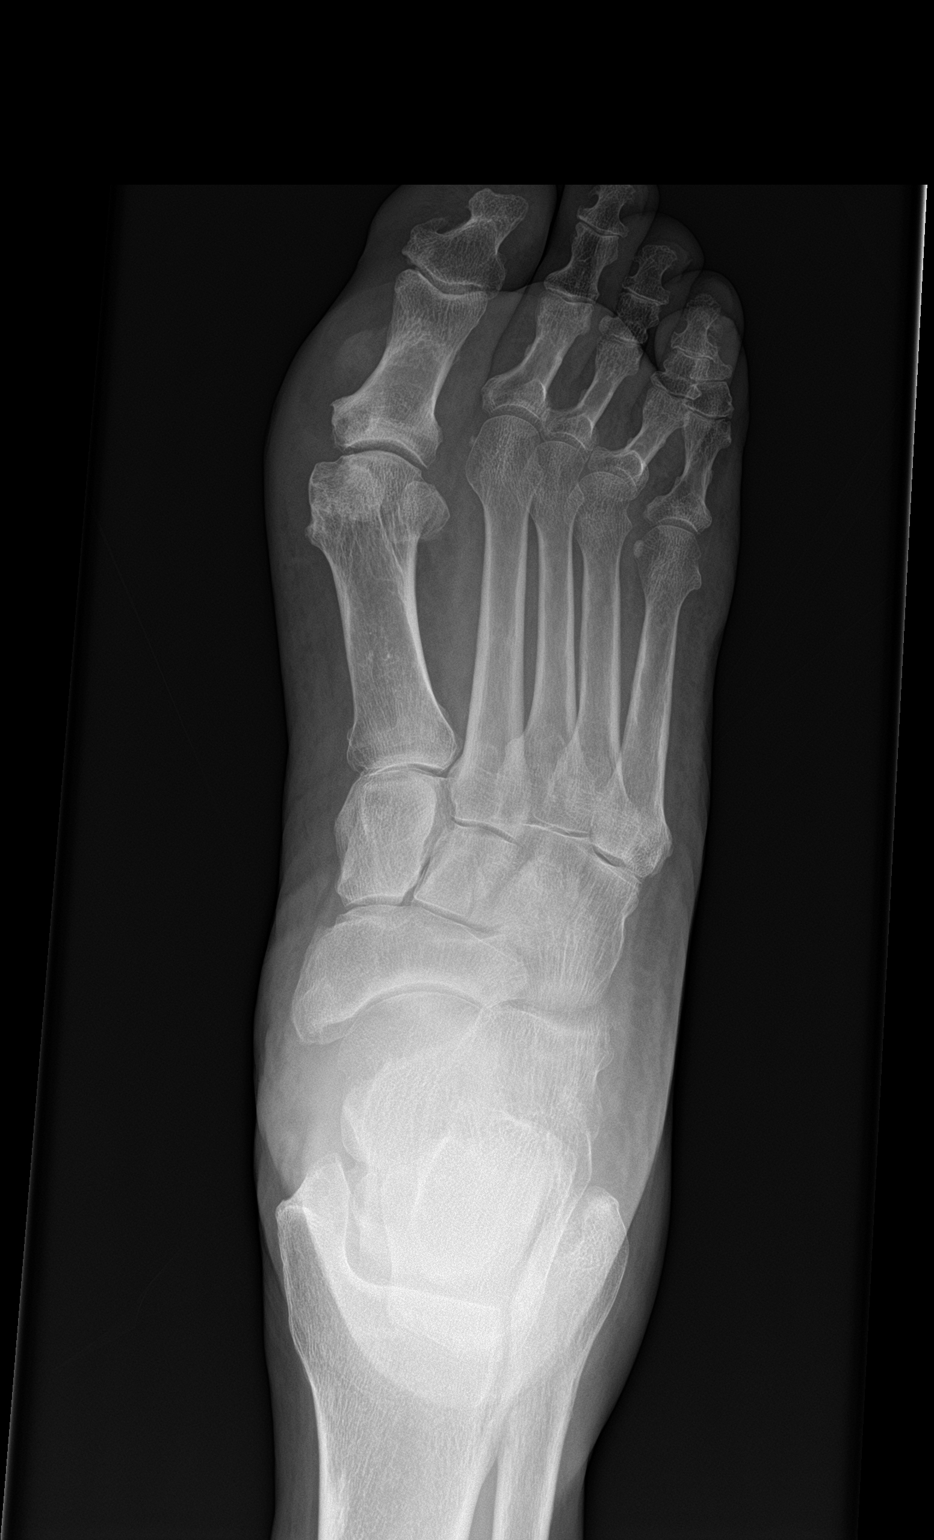

[foot obl]
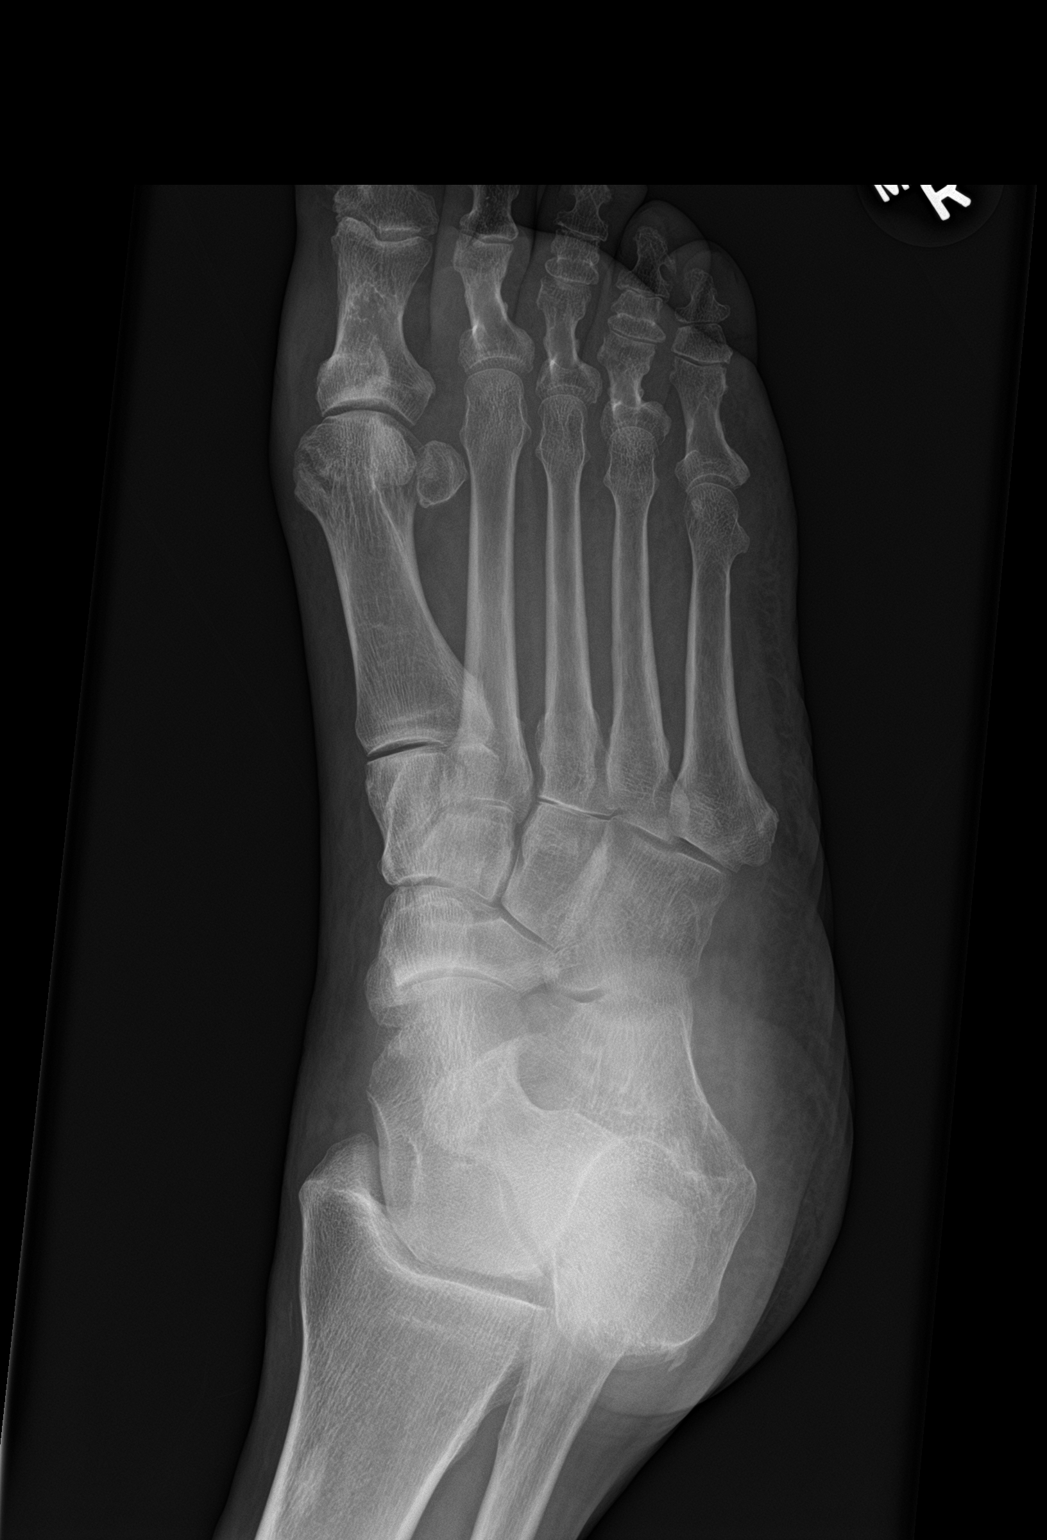

[foot lat]
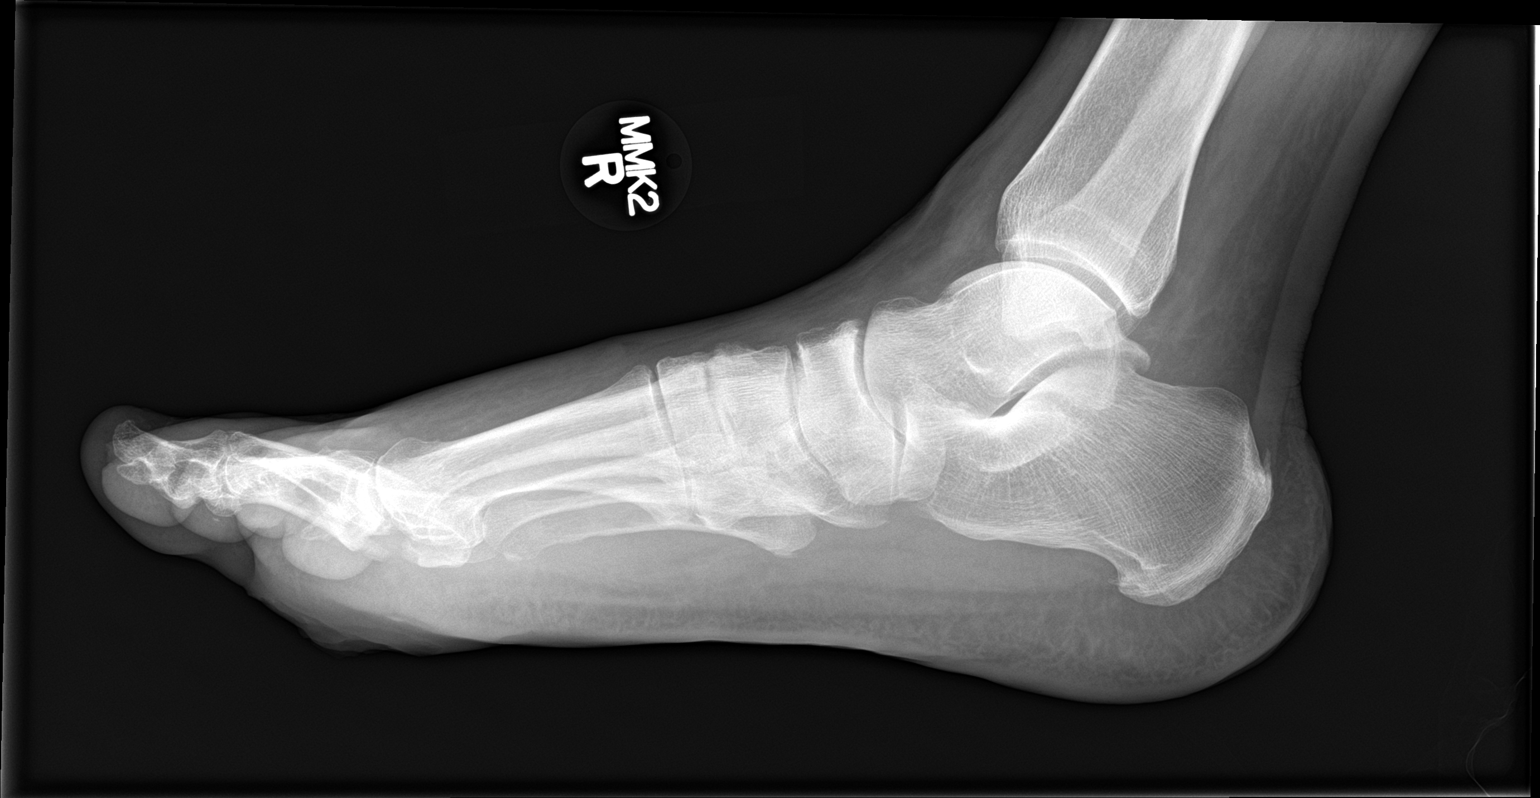

[3 of 3 positions shown; findings below may reference images not displayed]

FINDINGS: Soft tissue swelling is seen about the first MTP joint. No soft
tissue gas or radiopaque foreign body is identified. No bony
destructive change is seen. Moderate first MTP osteoarthritis is
noted. No fracture or dislocation.
IMPRESSION: Soft tissue swelling about the first MTP joint. Negative for
evidence of osteomyelitis.

Moderate first MTP osteoarthritis.

## 2017-12-24 ENCOUNTER — Other Ambulatory Visit: Payer: Self-pay | Admitting: Internal Medicine

## 2018-02-01 ENCOUNTER — Encounter: Payer: Self-pay | Admitting: Internal Medicine

## 2018-02-21 ENCOUNTER — Encounter (HOSPITAL_COMMUNITY): Payer: Self-pay | Admitting: Emergency Medicine

## 2018-02-21 ENCOUNTER — Ambulatory Visit (HOSPITAL_COMMUNITY)
Admission: EM | Admit: 2018-02-21 | Discharge: 2018-02-21 | Disposition: A | Discharge status: Home / Self Care | Payer: Medicare Other | Attending: Family Medicine | Admitting: Family Medicine

## 2018-02-21 DIAGNOSIS — S70361A Insect bite (nonvenomous), right thigh, initial encounter: Secondary | ICD-10-CM | POA: Diagnosis not present

## 2018-02-21 DIAGNOSIS — S0086XA Insect bite (nonvenomous) of other part of head, initial encounter: Secondary | ICD-10-CM | POA: Diagnosis not present

## 2018-02-21 DIAGNOSIS — T63441A Toxic effect of venom of bees, accidental (unintentional), initial encounter: Secondary | ICD-10-CM

## 2018-02-21 MED ORDER — PREDNISONE 50 MG PO TABS: 50.0000 mg | ORAL_TABLET | Freq: Every day | ORAL | 0 refills | Status: AC

## 2018-02-21 MED ORDER — CETIRIZINE HCL 10 MG PO TABS: 10.0000 mg | ORAL_TABLET | Freq: Every day | ORAL | 0 refills | Status: DC

## 2018-02-21 NOTE — Discharge Instructions (Signed)
Start prednisone as directed. Zyrtec for itching, benadryl at night as needed. Ice compress to the area. Monitor for worsening symptoms, spreading redness, increased warmth, fever, follow up for reevaluation needed. If having trouble breathing, trouble swallowing, swelling of the throat, go to the emergency department for further evaluation needed.

## 2018-02-21 NOTE — ED Provider Notes (Signed)
[facility]    CSN: [id number] Arrival date & time: 02/21/18  1726     History   Chief Complaint Chief Complaint  Patient presents with  . Insect Bite    HPI [name] [name] is a 67 y.o. male.   67 year old male comes in for evaluation after being stung by yellow jacket yesterday. States was stung once to the right temple, and once to the right posterior thigh.  Area with swelling and redness and mild soreness.  States swelling has slightly decreased after ice compress.  Denies trouble breathing, trouble swallowing, swelling of the throat.  Denies fever, chills, night sweats.  Took 2 Benadryl's with some relief.     Past Medical History:  Diagnosis Date  . Hypertension     Patient Active Problem List   Diagnosis Date Noted  . Hyperglycemia 11/05/2017  . Nondisplaced fracture of medial malleolus of right tibia, initial encounter for closed fracture 02/12/2017  . Tibia fracture 02/11/2017  . Septic arthritis of interphalangeal joint of toe, right ([place]) 05/26/2016  . Essential hypertension 05/25/2016  . Hyperlipidemia 08/02/2015  . Elevated PSA 08/02/2015  . Chronic kidney disease 07/31/2015  . Legally blind in right eye, as defined in [place] 07/10/2015    Past Surgical History:  Procedure Laterality Date  . INCISION AND DRAINAGE OF WOUND Right 05/25/2016   Procedure: IRRIGATION AND DEBRIDEMENT WOUND RIGHT FOOT;  Surgeon: Edrick Kins, DPM;  Location: [place];  Service: Podiatry;  Laterality: Right;  . NO PAST SURGERIES    . TIBIA IM NAIL INSERTION Right 02/12/2017   Procedure: INTRAMEDULLARY (IM) NAIL TIBIAL, RIGHT;  Surgeon: Leandrew Koyanagi, MD;  Location: [facility];  Service: Orthopedics;  Laterality: Right;       Home Medications    Prior to Admission medications   Medication Sig Start Date End Date Taking? Authorizing Provider  amLODipine (NORVASC) 5 MG tablet Take 1 tablet (5 mg total) by mouth daily. 11/07/17  Yes Burns, Claudina Lick, MD  atorvastatin (LIPITOR)  20 MG tablet Take 1 tablet (20 mg total) by mouth daily. 11/10/17  Yes Burns, Claudina Lick, MD  irbesartan (AVAPRO) 75 MG tablet Take 1 tablet (75 mg total) by mouth daily. 11/07/17  Yes Burns, Claudina Lick, MD  cetirizine (ZYRTEC) 10 MG tablet Take 1 tablet (10 mg total) by mouth daily. 02/21/18   Tasia Catchings, Amy V, PA-C  predniSONE (DELTASONE) 50 MG tablet Take 1 tablet (50 mg total) by mouth daily for 5 days. 02/21/18 02/26/18  Ok Edwards, PA-C    Family History Family History  Problem Relation Age of Onset  . Cancer Mother   . Heart disease Father   . Hypertension Father     Social History Social History   Tobacco Use  . Smoking status: Never Smoker  . Smokeless tobacco: Never Used  Substance Use Topics  . Alcohol use: No  . Drug use: No     Allergies   Doxycycline   Review of Systems Review of Systems  Reason unable to perform ROS: See HPI as above.     Physical Exam Triage Vital Signs ED Triage Vitals [02/21/18 1825]  Enc Vitals Group     BP (!) 172/90     Pulse Rate (!) 47     Resp 16     Temp 97.9 F (36.6 C)     Temp Source Oral     SpO2 100 %     Weight 230 lb (104.3 kg)  Height      Head Circumference      Peak Flow      Pain Score 6     Pain Loc      Pain Edu?      Excl. in [place]?    No data found.  Updated Vital Signs BP (!) 172/90   Pulse (!) 47   Temp 97.9 F (36.6 C) (Oral)   Resp 16   Wt 230 lb (104.3 kg)   SpO2 100%   BMI 30.34 kg/m   Physical Exam  Constitutional: He is oriented to person, place, and time. He appears well-developed and well-nourished. No distress.  HENT:  Head: Normocephalic and atraumatic.  Mouth/Throat: Oropharynx is clear and moist.  Eyes: Pupils are equal, round, and reactive to light. Conjunctivae are normal.  Pulmonary/Chest: Effort normal. No accessory muscle usage or stridor. No respiratory distress.  Neurological: He is alert and oriented to person, place, and time.  Skin: Skin is warm and dry. He is not diaphoretic.    Swelling to the right temple, right check inferior to the eye with mild erythema. Mild increase in warmth. No tenderness to palpation.   Swelling with erythema and warmth to the right posterior thigh. No tenderness to palpation.     UC Treatments / Results  Labs (all labs ordered are listed, but only abnormal results are displayed) Labs Reviewed - No data to display  EKG None  Radiology No results found.  Procedures Procedures (including critical care time)  Medications Ordered in UC Medications - No data to display  Initial Impression / Assessment and Plan / UC Course  I have reviewed the triage vital signs and the nursing notes.  Pertinent labs & imaging results that were available during my care of the patient were reviewed by me and considered in my medical decision making (see chart for details).    No alarming signs on exam.  Start prednisone as directed.  Continue antihistamine.  Ice compress.  Refrain from scratching.  Return precautions given.  Patient expresses understanding and agrees to plan.  Final Clinical Impressions(s) / UC Diagnoses   Final diagnoses:  Bee sting, accidental or unintentional, initial encounter    ED Prescriptions    Medication Sig Dispense Auth. Provider   predniSONE (DELTASONE) 50 MG tablet Take 1 tablet (50 mg total) by mouth daily for 5 days. 5 tablet Yu, Amy V, PA-C   cetirizine (ZYRTEC) 10 MG tablet Take 1 tablet (10 mg total) by mouth daily. 15 tablet Tobin Chad, [place] 02/21/18 1909

## 2018-02-21 NOTE — ED Triage Notes (Signed)
PT was stung by a yellow jacket on right temple and right thigh yesterday. [name] [name] is red and swollen. No airway issues

## 2018-05-03 ENCOUNTER — Other Ambulatory Visit: Payer: Self-pay | Admitting: Internal Medicine

## 2018-05-09 ENCOUNTER — Telehealth: Payer: Self-pay | Admitting: Family

## 2018-05-09 NOTE — Telephone Encounter (Signed)
Copied from [place] [id number]. Topic: General - Other >> May 09, 2018  7:41 AM [name] [name] wrote: Reason for CRM: pt wanting to switch from [facility] to [name] pt doesn't want to say why

## 2018-05-09 NOTE — Telephone Encounter (Signed)
It is ok with me.  

## 2018-05-09 NOTE — Telephone Encounter (Signed)
Patient would like to transfer care from Dr Quay Burow to [name]. Dr Quay Burow, is this okay with you? Mickel Baas, is this okay with you?

## 2018-05-11 ENCOUNTER — Ambulatory Visit: Payer: Medicare Other | Admitting: Internal Medicine

## 2018-05-11 NOTE — Telephone Encounter (Signed)
Okay; thanks.

## 2018-05-12 NOTE — Telephone Encounter (Signed)
Left message for patient to call back to schedule.  °

## 2018-05-12 NOTE — Telephone Encounter (Signed)
Pt has been schedule.

## 2018-06-24 ENCOUNTER — Other Ambulatory Visit: Payer: Self-pay | Admitting: Internal Medicine

## 2018-08-04 ENCOUNTER — Encounter: Payer: Self-pay | Admitting: Family

## 2018-08-04 ENCOUNTER — Ambulatory Visit (INDEPENDENT_AMBULATORY_CARE_PROVIDER_SITE_OTHER): Payer: Medicare Other | Admitting: Family

## 2018-08-04 ENCOUNTER — Other Ambulatory Visit (INDEPENDENT_AMBULATORY_CARE_PROVIDER_SITE_OTHER): Payer: Medicare Other

## 2018-08-04 VITALS — BP 138/80 | HR 59 | Temp 97.9°F | Ht 73.0 in | Wt 245.0 lb

## 2018-08-04 DIAGNOSIS — E782 Mixed hyperlipidemia: Secondary | ICD-10-CM | POA: Diagnosis not present

## 2018-08-04 DIAGNOSIS — R972 Elevated prostate specific antigen [PSA]: Secondary | ICD-10-CM

## 2018-08-04 DIAGNOSIS — I10 Essential (primary) hypertension: Secondary | ICD-10-CM

## 2018-08-04 LAB — COMPREHENSIVE METABOLIC PANEL
ALT: 25 U/L (ref 0–53)
AST: 20 U/L (ref 0–37)
Albumin: 4.3 g/dL (ref 3.5–5.2)
Alkaline Phosphatase: 69 U/L (ref 39–117)
BUN: 19 mg/dL (ref 6–23)
CHLORIDE: 106 meq/L (ref 96–112)
CO2: 26 mEq/L (ref 19–32)
Calcium: 9.1 mg/dL (ref 8.4–10.5)
Creatinine, Ser: 1.49 mg/dL (ref 0.40–1.50)
GFR: 57.25 mL/min — AB (ref >60.00)
Glucose, Bld: 91 mg/dL (ref 70–99)
Potassium: 3.5 mEq/L (ref 3.5–5.1)
Sodium: 141 mEq/L (ref 135–145)
Total Bilirubin: 0.7 mg/dL (ref 0.2–1.2)
Total Protein: 7 g/dL (ref 6.0–8.3)

## 2018-08-04 LAB — CBC WITH DIFFERENTIAL/PLATELET
Basophils Absolute: 0 10*3/uL (ref 0.0–0.1)
Basophils Relative: 0.5 % (ref 0.0–3.0)
Eosinophils Absolute: 0.1 10*3/uL (ref 0.0–0.7)
Eosinophils Relative: 1.1 % (ref 0.0–5.0)
HCT: 41.7 % (ref 39.0–52.0)
Hemoglobin: 14.3 g/dL (ref 13.0–17.0)
Lymphocytes Relative: 37.2 % (ref 12.0–46.0)
Lymphs Abs: 1.9 10*3/uL (ref 0.7–4.0)
MCHC: 34.1 g/dL (ref 30.0–36.0)
MCV: 91.6 fl (ref 78.0–100.0)
MONOS PCT: 9.1 % (ref 3.0–12.0)
Monocytes Absolute: 0.5 10*3/uL (ref 0.1–1.0)
Neutro Abs: 2.7 10*3/uL (ref 1.4–7.7)
Neutrophils Relative %: 52.1 % (ref 43.0–77.0)
Platelets: 105 10*3/uL — ABNORMAL LOW (ref 150.0–400.0)
RBC: 4.56 Mil/uL (ref 4.22–5.81)
RDW: 13.7 % (ref 11.5–15.5)
WBC: 5.2 10*3/uL (ref 4.0–10.5)

## 2018-08-04 LAB — LIPID PANEL
Cholesterol: 136 mg/dL (ref 0–200)
HDL: 31.5 mg/dL — ABNORMAL LOW (ref >39.00)
LDL CALC: 66 mg/dL (ref 0–99)
NonHDL: 104.06
Total CHOL/HDL Ratio: 4
Triglycerides: 189 mg/dL — ABNORMAL HIGH (ref 0.0–149.0)
VLDL: 37.8 mg/dL (ref 0.0–40.0)

## 2018-08-04 LAB — PSA: PSA: 20.7 ng/mL — ABNORMAL HIGH (ref 0.10–4.00)

## 2018-08-04 MED ORDER — ATORVASTATIN CALCIUM 20 MG PO TABS: 20.0000 mg | ORAL_TABLET | Freq: Every day | ORAL | 3 refills | Status: DC

## 2018-08-04 MED ORDER — AMLODIPINE BESYLATE 5 MG PO TABS: 5.0000 mg | ORAL_TABLET | Freq: Every day | ORAL | 3 refills | Status: DC

## 2018-08-04 MED ORDER — IRBESARTAN 75 MG PO TABS: 75.0000 mg | ORAL_TABLET | Freq: Every day | ORAL | 3 refills | Status: DC

## 2018-08-04 NOTE — Progress Notes (Signed)
[name] [name] is a 68 y.o. male with the following history as recorded in Epic[employer]:  Patient Active Problem List   Diagnosis Date Noted  . Hyperglycemia 11/05/2017  . Nondisplaced fracture of medial malleolus of right tibia, initial encounter for closed fracture 02/12/2017  . Tibia fracture 02/11/2017  . Septic arthritis of interphalangeal joint of toe, right ([place]) 05/26/2016  . Essential hypertension 05/25/2016  . Hyperlipidemia 08/02/2015  . Elevated PSA 08/02/2015  . Chronic kidney disease 07/31/2015  . Legally blind in right eye, as defined in [place] 07/10/2015    Current Outpatient Medications  Medication Sig Dispense Refill  . amLODipine (NORVASC) 5 MG tablet Take 1 tablet (5 mg total) by mouth daily. 90 tablet 3  . atorvastatin (LIPITOR) 20 MG tablet Take 1 tablet (20 mg total) by mouth daily. 90 tablet 3  . irbesartan (AVAPRO) 75 MG tablet Take 1 tablet (75 mg total) by mouth daily. 90 tablet 3   No current facility-administered medications for this visit.     Allergies: Doxycycline  Past Medical History:  Diagnosis Date  . Hypertension     Past Surgical History:  Procedure Laterality Date  . INCISION AND DRAINAGE OF WOUND Right 05/25/2016   Procedure: IRRIGATION AND DEBRIDEMENT WOUND RIGHT FOOT;  Surgeon: Edrick Kins, DPM;  Location: [facility];  Service: Podiatry;  Laterality: Right;  . NO PAST SURGERIES    . TIBIA IM NAIL INSERTION Right 02/12/2017   Procedure: INTRAMEDULLARY (IM) NAIL TIBIAL, RIGHT;  Surgeon: Leandrew Koyanagi, MD;  Location: [facility];  Service: Orthopedics;  Laterality: Right;    Family History  Problem Relation Age of Onset  . Cancer Mother   . Heart disease Father   . Hypertension Father     Social History   Tobacco Use  . Smoking status: Never Smoker  . Smokeless tobacco: Never Used  Substance Use Topics  . Alcohol use: No    Subjective:  Presents to transfer care from another provider in the clinic; history of hypertension, hyperlipidemia;   Elevated PSA- was referred to urology; need to determine if patient actually went;   In baseline state of health today- no concerns;      Objective:  Vitals:   08/04/18 1545  BP: 138/80  Pulse: (!) 59  Temp: 97.9 F (36.6 C)  TempSrc: Oral  SpO2: 97%  Weight: 245 lb (111.1 kg)  Height: 6' 1"  (1.854 m)    General: Well developed, well nourished, in no acute distress  Skin : Warm and dry.  Head: Normocephalic and atraumatic  Lungs: Respirations unlabored; clear to auscultation bilaterally without wheeze, rales, rhonchi  CVS exam: normal rate and regular rhythm.  Neurologic: Alert and oriented; speech intact; face symmetrical; moves all extremities well; CNII-XII intact without focal deficit   Assessment:  1. Essential hypertension   2. Mixed hyperlipidemia   3. Elevated PSA     Plan:  1. Stable; check CBC, CMP today; refills updated; follow-up in 6 months with me; 2. Update lipid panel today; refill updated; 3. Re-check PSA; stressed necessity of going to urology for evaluation- patient agrees/ referral is re-done today;   Patient defers vaccines today, defers scheduling for colonoscopy; does agree to see our RN in 2 months for AWV.    No follow-ups on file.  Orders Placed This Encounter  Procedures  . PSA    Standing Status:   Future    Standing Expiration Date:   08/04/2019  . CBC w/Diff    Standing  Status:   Future    Standing Expiration Date:   08/04/2019  . Comp Met (CMET)    Standing Status:   Future    Standing Expiration Date:   08/04/2019  . Lipid panel    Standing Status:   Future    Standing Expiration Date:   08/05/2019  . Ambulatory referral to Urology    Referral Priority:   Routine    Referral Type:   Consultation    Referral Reason:   Specialty Services Required    Requested Specialty:   Urology    Number of Visits Requested:   1    Requested Prescriptions   Signed Prescriptions Disp Refills  . amLODipine (NORVASC) 5 MG tablet 90 tablet 3     Sig: Take 1 tablet (5 mg total) by mouth daily.  Marland Kitchen atorvastatin (LIPITOR) 20 MG tablet 90 tablet 3    Sig: Take 1 tablet (20 mg total) by mouth daily.  . irbesartan (AVAPRO) 75 MG tablet 90 tablet 3    Sig: Take 1 tablet (75 mg total) by mouth daily.
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
## 2018-08-23 IMAGING — RF DG TIBIA/FIBULA 2V*R*
1 series · 7 of 7 positions shown · non-contrast
Comparison: 02/11/2017

CLINICAL DATA: Tibial nail placement

EXAM:
DG C-ARM 61-120 MIN; RIGHT TIBIA AND FIBULA - 2 VIEW

[Series 1: run · 7 of 7 slices shown]
[im 1/7]
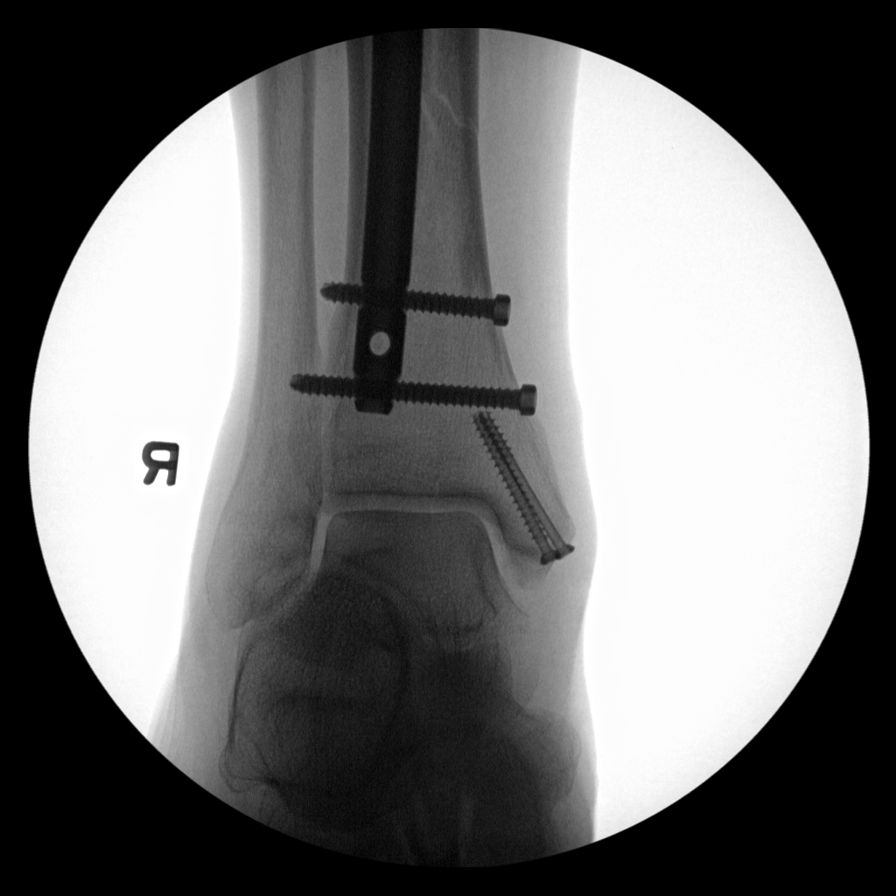
[im 2/7]
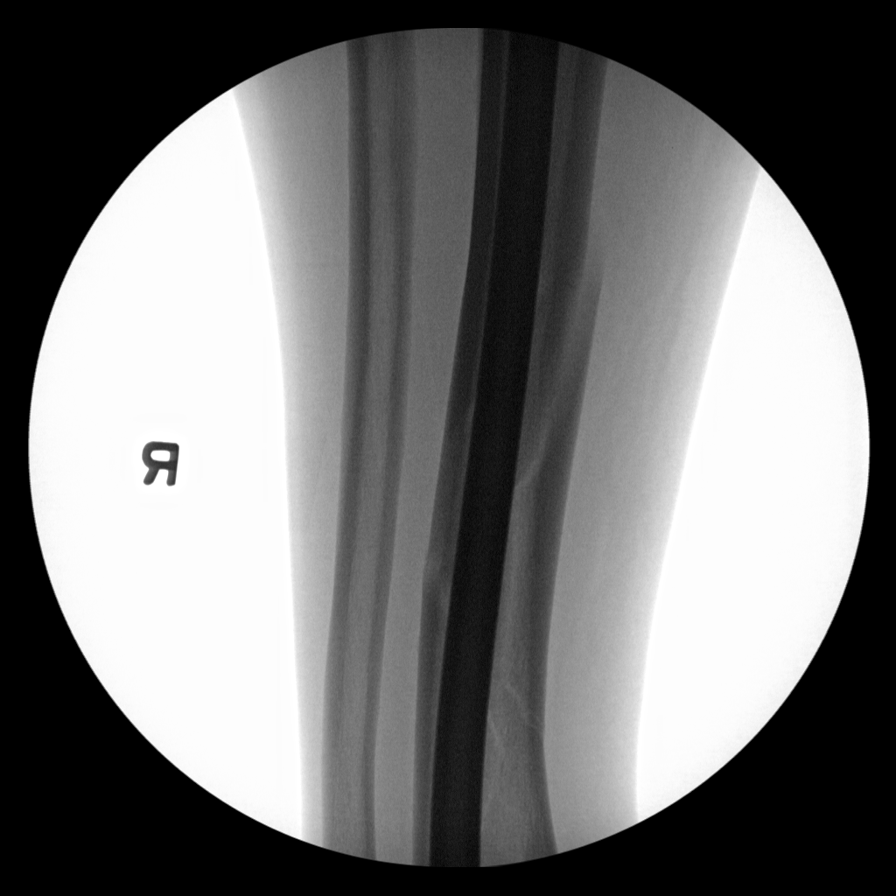
[im 3/7]
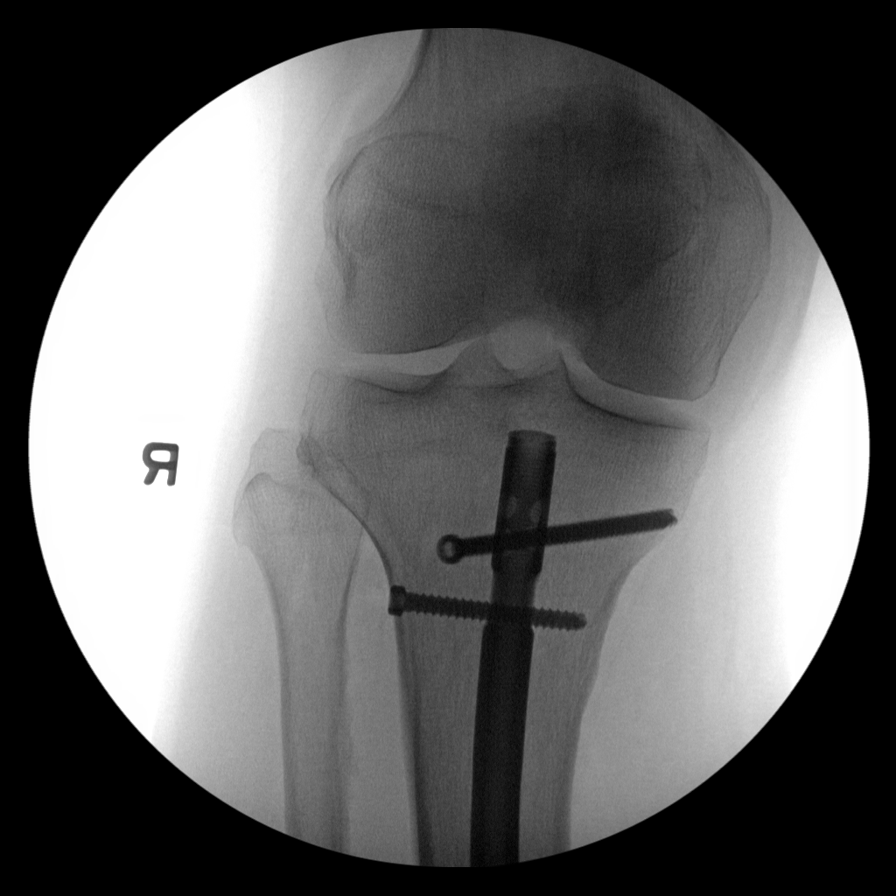
[im 4/7]
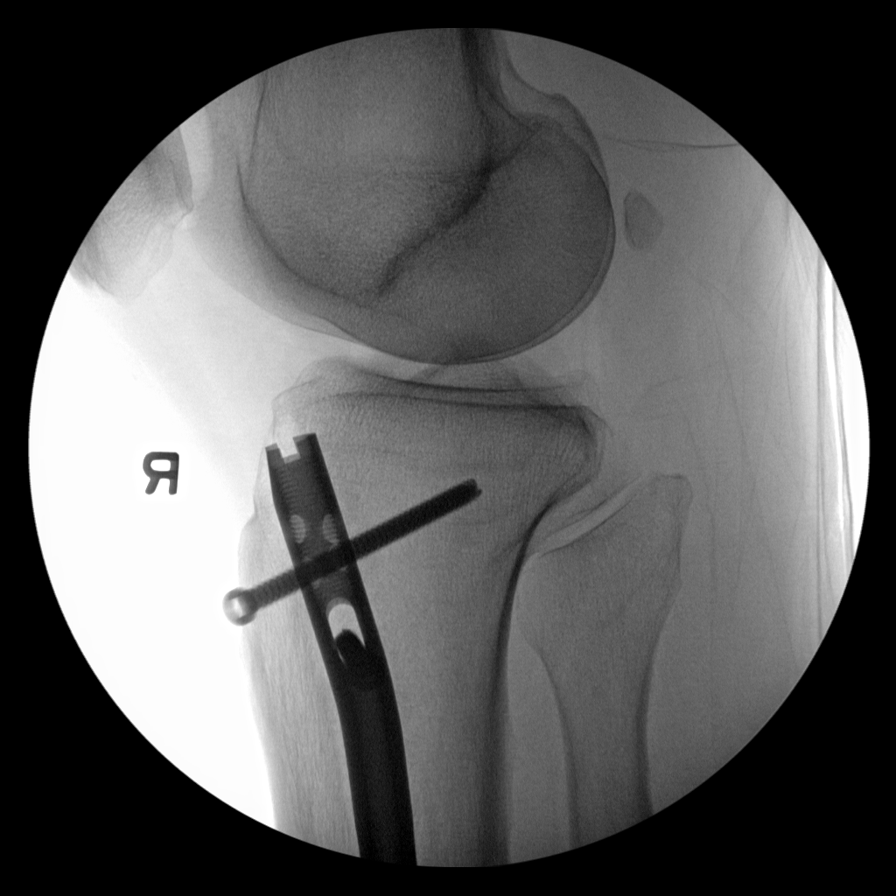
[im 5/7]
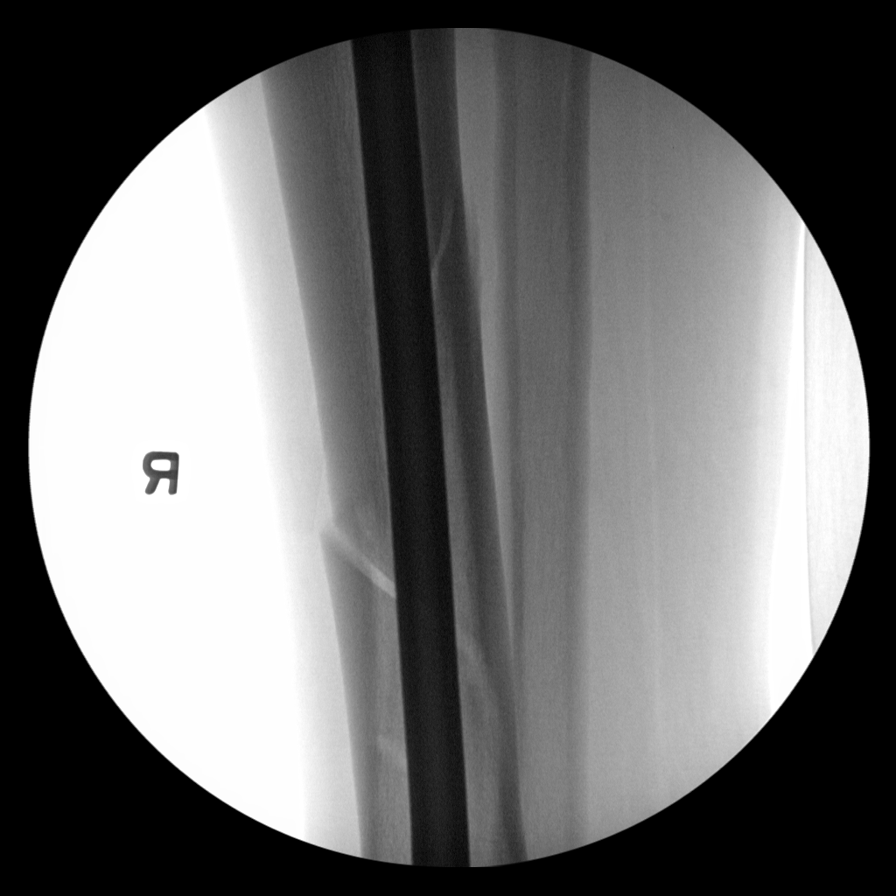
[im 6/7]
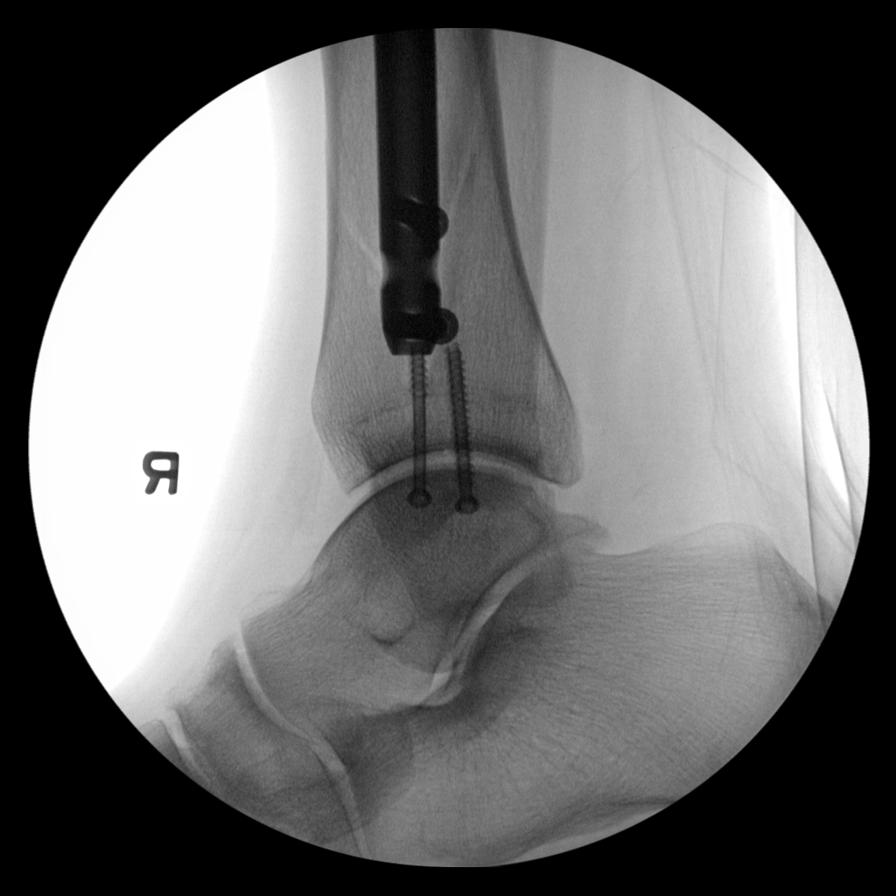
[im 7/7]
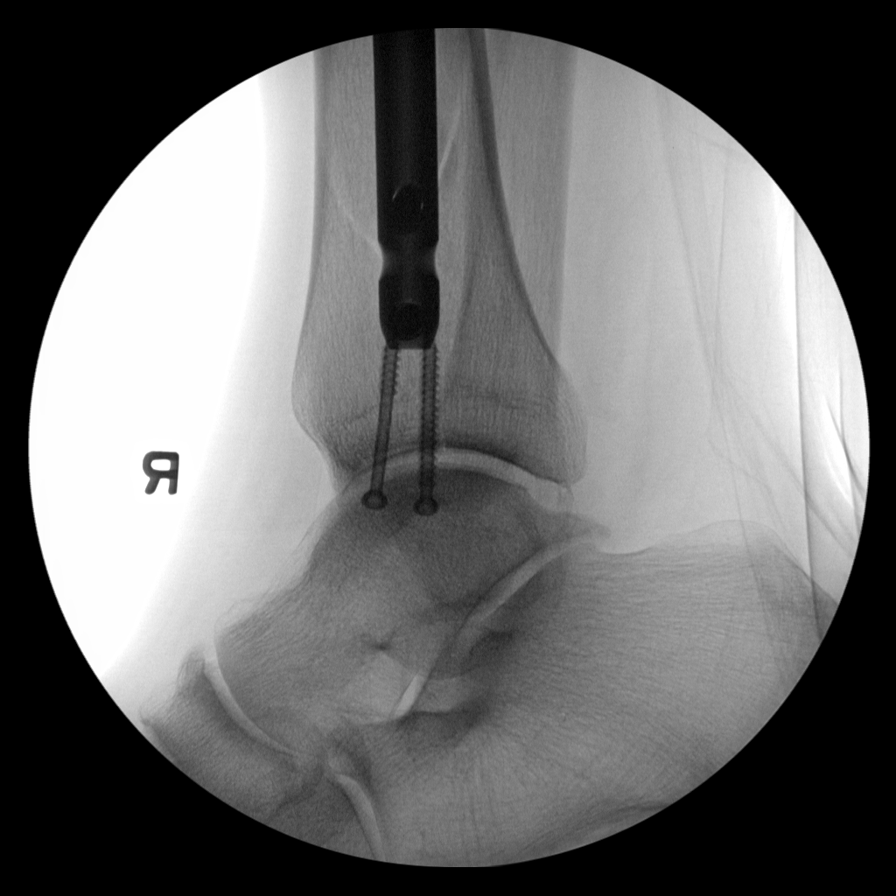

[7 of 7 positions shown; findings below may reference images not displayed]

FLUOROSCOPY TIME:  Radiation Exposure Index (as provided by the
fluoroscopic device): Not available

If the device does not provide the exposure index:

Fluoroscopy Time:  3 minutes 10 seconds

Number of Acquired Images:  8
FINDINGS: Medullary rod is noted with both proximal and distal fixation screws
within the tibia. Fracture fragments are in near anatomic alignment.
Fixation screws are also noted traversing the medial malleolus.
IMPRESSION: Status post ORIF of right tibial fracture

## 2018-09-26 ENCOUNTER — Telehealth: Payer: Self-pay | Admitting: *Deleted

## 2018-09-26 NOTE — Telephone Encounter (Signed)
Called patient and LVM to inform them the nurse needs to either convert their upcoming AWV to a virtual visit or reschedule the visit out into the future due to covid-19 safety measures. Nurse requested that the patient call-back and stated she would call them back at a later date.    

## 2018-09-28 ENCOUNTER — Other Ambulatory Visit: Payer: Self-pay | Admitting: Internal Medicine

## 2018-10-06 ENCOUNTER — Ambulatory Visit: Payer: Medicare Other | Admitting: Family

## 2018-10-10 ENCOUNTER — Telehealth: Payer: Self-pay | Admitting: *Deleted

## 2018-10-10 NOTE — Telephone Encounter (Signed)
LVM for patient to call back in regards to scheduling AWV with our health coach. Discussed doing a virtual visit and asked patient to call nurse back at [phone] to schedule.

## 2018-10-24 ENCOUNTER — Other Ambulatory Visit: Payer: Self-pay | Admitting: Urology

## 2018-10-24 ENCOUNTER — Other Ambulatory Visit (HOSPITAL_COMMUNITY): Payer: Self-pay | Admitting: Urology

## 2018-10-24 DIAGNOSIS — C61 Malignant neoplasm of prostate: Secondary | ICD-10-CM

## 2018-10-26 ENCOUNTER — Encounter (HOSPITAL_COMMUNITY)
Admission: RE | Admit: 2018-10-26 | Discharge: 2018-10-26 | Disposition: A | Discharge status: Home / Self Care | Payer: Medicare Other | Source: Ambulatory Visit | Attending: Urology | Admitting: Urology

## 2018-10-26 ENCOUNTER — Other Ambulatory Visit: Payer: Self-pay

## 2018-10-26 DIAGNOSIS — C61 Malignant neoplasm of prostate: Secondary | ICD-10-CM | POA: Diagnosis not present

## 2018-10-26 MED ORDER — TECHNETIUM TC 99M MEDRONATE IV KIT
20.8000 | PACK | Freq: Once | INTRAVENOUS | Status: AC | PRN
  Administered 2018-10-26: 20.8 via INTRAVENOUS

## 2018-11-04 ENCOUNTER — Other Ambulatory Visit: Payer: Self-pay | Admitting: Internal Medicine

## 2018-11-24 ENCOUNTER — Ambulatory Visit: Payer: Medicare Other | Admitting: Family

## 2018-11-28 ENCOUNTER — Other Ambulatory Visit: Payer: Self-pay | Admitting: Urology

## 2019-01-03 ENCOUNTER — Other Ambulatory Visit (HOSPITAL_COMMUNITY): Payer: Self-pay | Admitting: *Deleted

## 2019-01-03 NOTE — Patient Instructions (Addendum)
YOU NEED TO HAVE A COVID 19 TEST ON 01-15-2019 AT 905 AM. THIS TEST MUST BE DONE BEFORE SURGERY, COME TO [facility] ENTRANCE. ONCE YOUR COVID TEST IS COMPLETED, PLEASE BEGIN THE QUARANTINE INSTRUCTIONS AS OUTLINED IN YOUR HANDOUT.                [name] [name]    Your procedure is scheduled on: 01-08-2019  Report to [facility] Main  Entrance  Report to [facility] at  530  AM      Call this number if you have problems the morning of surgery [phone]    Remember: Do not eat food :After Midnight. Saturday NIGHT, CLEAR LIQUIDS ALL DAY Sunday 01-07-2019, NO CLEAR LIQUIDS OR ANYTHING BY MOUTH AFTER MIDNIGHT Sunday NIGHT. FOLLOW ALL DR Alinda Money BOWEL PREP INSTRUCTIONS.   BRUSH YOUR TEETH MORNING OF SURGERY AND RINSE YOUR MOUTH OUT, NO CHEWING GUM CANDY OR MINTS.     CLEAR LIQUID DIET   Foods Allowed                                                                     Foods Excluded  Coffee and tea, regular and decaf                             liquids that you cannot  Plain Jell-O any favor except red or purple                                           see through such as: Fruit ices (not with fruit pulp)                                     milk, soups, orange juice  Iced Popsicles                                    All solid food Carbonated beverages, regular and diet                                    Cranberry, grape and apple juices Sports drinks like Gatorade Lightly seasoned clear broth or consume(fat free) Sugar, honey syrup  Sample Menu Breakfast                                Lunch                                     Supper Cranberry juice                    Beef broth  Chicken broth Jell-O                                     Grape juice                           Apple juice Coffee or tea                        Jell-O                                      Popsicle                                                 Coffee or tea                        Coffee or tea  _____________________________________________________________________     Take these medicines the morning of surgery with A SIP OF WATER: ATORVASTATIN (LIPITOR), AMLODIPINE (NORVASC)                                You may not have any metal on your body including hair pins and              piercings  Do not wear jewelry, make-up, lotions, powders or perfumes, deodorant                           Men may shave face and neck.   Do not bring valuables to the hospital. [place].  Contacts, dentures or bridgework may not be worn into surgery.  Leave suitcase in the car. After surgery it may be brought to your room.     _____________________________________________________________________             [facility] - Preparing for Surgery Before surgery, you can play an important role.  Because skin is not sterile, your skin needs to be as free of germs as possible.  You can reduce the number of germs on your skin by washing with CHG (chlorahexidine gluconate) soap before surgery.  CHG is an antiseptic cleaner which kills germs and bonds with the skin to continue killing germs even after washing. Please DO NOT use if you have an allergy to CHG or antibacterial soaps.  If your skin becomes reddened/irritated stop using the CHG and inform your nurse when you arrive at Short Stay. Do not shave (including legs and underarms) for at least 48 hours prior to the first CHG shower.  You may shave your face/neck. Please follow these instructions carefully:  1.  Shower with CHG Soap the night before surgery and the  morning of Surgery.  2.  If you choose to wash your hair, wash your hair first as usual with your  normal  shampoo.  3.  After you shampoo, rinse your hair and body thoroughly to remove the  shampoo.  4.  Use CHG as you would any other liquid soap.  You can apply chg  directly  to the skin and wash                       Gently with a scrungie or clean washcloth.  5.  Apply the CHG Soap to your body ONLY FROM THE NECK DOWN.   Do not use on face/ open                           Wound or open sores. Avoid contact with eyes, ears mouth and genitals (private parts).                       Wash face,  Genitals (private parts) with your normal soap.             6.  Wash thoroughly, paying special attention to the area where your surgery  will be performed.  7.  Thoroughly rinse your body with warm water from the neck down.  8.  DO NOT shower/wash with your normal soap after using and rinsing off  the CHG Soap.                9.  Pat yourself dry with a clean towel.            10.  Wear clean pajamas.            11.  Place clean sheets on your bed the night of your first shower and do not  sleep with pets. Day of Surgery : Do not apply any lotions/deodorants the morning of surgery.  Please wear clean clothes to the hospital/surgery center.  FAILURE TO FOLLOW THESE INSTRUCTIONS MAY RESULT IN THE CANCELLATION OF YOUR SURGERY PATIENT SIGNATURE_________________________________  NURSE SIGNATURE__________________________________  ________________________________________________________________________   Adam Phenix  An incentive spirometer is a tool that can help keep your lungs clear and active. This tool measures how well you are filling your lungs with each breath. Taking long deep breaths may help reverse or decrease the chance of developing breathing (pulmonary) problems (especially infection) following:  A long period of time when you are unable to move or be active. BEFORE THE PROCEDURE   If the spirometer includes an indicator to show your best effort, your nurse or respiratory therapist will set it to a desired goal.  If possible, sit up straight or lean slightly forward. Try not to slouch.  Hold the incentive spirometer in an upright  position. INSTRUCTIONS FOR USE  1. Sit on the edge of your bed if possible, or sit up as far as you can in bed or on a chair. 2. Hold the incentive spirometer in an upright position. 3. Breathe out normally. 4. Place the mouthpiece in your mouth and seal your lips tightly around it. 5. Breathe in slowly and as deeply as possible, raising the piston or the ball toward the top of the column. 6. Hold your breath for 3-5 seconds or for as long as possible. Allow the piston or ball to fall to the bottom of the column. 7. Remove the mouthpiece from your mouth and breathe out normally. 8. Rest for a few seconds and repeat Steps 1 through 7 at least 10 times every 1-2 hours when you are awake. Take your time and take a few normal breaths between deep breaths. 9. The spirometer may include an indicator to  show your best effort. Use the indicator as a goal to work toward during each repetition. 10. After each set of 10 deep breaths, practice coughing to be sure your lungs are clear. If you have an incision (the cut made at the time of surgery), support your incision when coughing by placing a pillow or rolled up towels firmly against it. Once you are able to get out of bed, walk around indoors and cough well. You may stop using the incentive spirometer when instructed by your caregiver.  RISKS AND COMPLICATIONS  Take your time so you do not get dizzy or light-headed.  If you are in pain, you may need to take or ask for pain medication before doing incentive spirometry. It is harder to take a deep breath if you are having pain. AFTER USE  Rest and breathe slowly and easily.  It can be helpful to keep track of a log of your progress. Your caregiver can provide you with a simple table to help with this. If you are using the spirometer at home, follow these instructions: [place] IF:   You are having difficultly using the spirometer.  You have trouble using the spirometer as often as  instructed.  Your pain medication is not giving enough relief while using the spirometer.  You develop fever of 100.5 F (38.1 C) or higher. SEEK IMMEDIATE MEDICAL CARE IF:   You cough up bloody sputum that had not been present before.  You develop fever of 102 F (38.9 C) or greater.  You develop worsening pain at or near the incision site. MAKE SURE YOU:   Understand these instructions.  Will watch your condition.  Will get help right away if you are not doing well or get worse. Document Released: 10/18/2006 Document Revised: 08/30/2011 Document Reviewed: 12/19/2006 Exit[employer] Patient Information 2014 Exit[employer], [place].   ________________________________________________________________________   WHAT IS A BLOOD TRANSFUSION? Blood Transfusion Information  A transfusion is the replacement of blood or some of its parts. Blood is made up of multiple cells which provide different functions.  Red blood cells carry oxygen and are used for blood loss replacement.  White blood cells fight against infection.  Platelets control bleeding.  Plasma helps clot blood.  Other blood products are available for specialized needs, such as hemophilia or other clotting disorders. BEFORE THE TRANSFUSION  Who gives blood for transfusions?   Healthy volunteers who are fully evaluated to make sure their blood is safe. This is blood bank blood. Transfusion therapy is the safest it has ever been in the practice of medicine. Before blood is taken from a donor, a complete history is taken to make sure that person has no history of diseases nor engages in risky social behavior (examples are intravenous drug use or sexual activity with multiple partners). The donor's travel history is screened to minimize risk of transmitting infections, such as malaria. The donated blood is tested for signs of infectious diseases, such as HIV and hepatitis. The blood is then tested to be sure it is compatible with you in  order to minimize the chance of a transfusion reaction. If you or a relative donates blood, this is often done in anticipation of surgery and is not appropriate for emergency situations. It takes many days to process the donated blood. RISKS AND COMPLICATIONS Although transfusion therapy is very safe and saves many lives, the main dangers of transfusion include:   Getting an infectious disease.  Developing a transfusion reaction. This is an allergic  reaction to something in the blood you were given. Every precaution is taken to prevent this. The decision to have a blood transfusion has been considered carefully by your caregiver before blood is given. Blood is not given unless the benefits outweigh the risks. AFTER THE TRANSFUSION  Right after receiving a blood transfusion, you will usually feel much better and more energetic. This is especially true if your red blood cells have gotten low (anemic). The transfusion raises the level of the red blood cells which carry oxygen, and this usually causes an energy increase.  The nurse administering the transfusion will monitor you carefully for complications. HOME CARE INSTRUCTIONS  No special instructions are needed after a transfusion. You may find your energy is better. Speak with your caregiver about any limitations on activity for underlying diseases you may have. SEEK MEDICAL CARE IF:   Your condition is not improving after your transfusion.  You develop redness or irritation at the intravenous (IV) site. SEEK IMMEDIATE MEDICAL CARE IF:  Any of the following symptoms occur over the next 12 hours:  Shaking chills.  You have a temperature by mouth above 102 F (38.9 C), not controlled by medicine.  Chest, back, or muscle pain.  People around you feel you are not acting correctly or are confused.  Shortness of breath or difficulty breathing.  Dizziness and fainting.  You get a rash or develop hives.  You have a decrease in urine  output.  Your urine turns a dark color or changes to pink, red, or brown. Any of the following symptoms occur over the next 10 days:  You have a temperature by mouth above 102 F (38.9 C), not controlled by medicine.  Shortness of breath.  Weakness after normal activity.  The white part of the eye turns yellow (jaundice).  You have a decrease in the amount of urine or are urinating less often.  Your urine turns a dark color or changes to pink, red, or brown. Document Released: 06/04/2000 Document Revised: 08/30/2011 Document Reviewed: 01/22/2008 [facility] Patient Information 2014 [place], [place].  _______________________________________________________________________

## 2019-01-05 ENCOUNTER — Other Ambulatory Visit (HOSPITAL_COMMUNITY)
Admission: RE | Admit: 2019-01-05 | Discharge: 2019-01-05 | Disposition: A | Discharge status: Home / Self Care | Payer: Medicare Other | Source: Ambulatory Visit | Attending: Urology | Admitting: Urology

## 2019-01-05 ENCOUNTER — Encounter (HOSPITAL_COMMUNITY): Payer: Self-pay

## 2019-01-05 ENCOUNTER — Other Ambulatory Visit: Payer: Self-pay

## 2019-01-05 ENCOUNTER — Encounter (HOSPITAL_COMMUNITY)
Admission: RE | Admit: 2019-01-05 | Discharge: 2019-01-05 | Disposition: A | Discharge status: Home / Self Care | Payer: Medicare Other | Source: Ambulatory Visit | Attending: Urology | Admitting: Urology

## 2019-01-05 DIAGNOSIS — E785 Hyperlipidemia, unspecified: Secondary | ICD-10-CM | POA: Diagnosis not present

## 2019-01-05 DIAGNOSIS — I10 Essential (primary) hypertension: Secondary | ICD-10-CM | POA: Insufficient documentation

## 2019-01-05 DIAGNOSIS — Z01818 Encounter for other preprocedural examination: Secondary | ICD-10-CM | POA: Insufficient documentation

## 2019-01-05 DIAGNOSIS — Z1159 Encounter for screening for other viral diseases: Secondary | ICD-10-CM | POA: Diagnosis not present

## 2019-01-05 DIAGNOSIS — C61 Malignant neoplasm of prostate: Secondary | ICD-10-CM | POA: Insufficient documentation

## 2019-01-05 DIAGNOSIS — Z23 Encounter for immunization: Secondary | ICD-10-CM | POA: Diagnosis not present

## 2019-01-05 DIAGNOSIS — Z79899 Other long term (current) drug therapy: Secondary | ICD-10-CM | POA: Insufficient documentation

## 2019-01-05 DIAGNOSIS — E7800 Pure hypercholesterolemia, unspecified: Secondary | ICD-10-CM | POA: Diagnosis not present

## 2019-01-05 LAB — BASIC METABOLIC PANEL
Anion gap: 6 (ref 5–15)
BUN: 26 mg/dL — ABNORMAL HIGH (ref 8–23)
CO2: 27 mmol/L (ref 22–32)
Calcium: 8.9 mg/dL (ref 8.9–10.3)
Chloride: 107 mmol/L (ref 98–111)
Creatinine, Ser: 1.39 mg/dL — ABNORMAL HIGH (ref 0.61–1.24)
GFR calc Af Amer: >60 mL/min (ref >60)
GFR calc non Af Amer: 53 mL/min — ABNORMAL LOW (ref >60)
Glucose, Bld: 97 mg/dL (ref 70–99)
Potassium: 3.8 mmol/L (ref 3.5–5.1)
Sodium: 140 mmol/L (ref 135–145)

## 2019-01-05 LAB — CBC
HCT: 41.6 % (ref 39.0–52.0)
Hemoglobin: 13.4 g/dL (ref 13.0–17.0)
MCH: 30.3 pg (ref 26.0–34.0)
MCHC: 32.2 g/dL (ref 30.0–36.0)
MCV: 94.1 fL (ref 80.0–100.0)
Platelets: 101 10*3/uL — ABNORMAL LOW (ref 150–400)
RBC: 4.42 MIL/uL (ref 4.22–5.81)
RDW: 13.4 % (ref 11.5–15.5)
WBC: 4.4 10*3/uL (ref 4.0–10.5)
nRBC: 0 % (ref 0.0–0.2)

## 2019-01-05 LAB — SURGICAL PCR SCREEN
MRSA, PCR: NEGATIVE
Staphylococcus aureus: NEGATIVE

## 2019-01-05 LAB — ABO/RH: ABO/RH(D): O POS

## 2019-01-05 LAB — SARS CORONAVIRUS 2 (TAT 6-24 HRS): SARS Coronavirus 2: NEGATIVE

## 2019-01-05 MED ORDER — MAGNESIUM CITRATE PO SOLN: 1.0000 | Freq: Once | ORAL | Status: DC

## 2019-01-05 MED ORDER — FLEET ENEMA 7-19 GM/118ML RE ENEM: 1.0000 | ENEMA | Freq: Once | RECTAL | Status: DC

## 2019-01-05 NOTE — H&P (Signed)
CC: Prostate Cancer   [name] [name] is a 68 year old gentleman who was found to have an elevated PSA of 20.7 and a right mid prostate nodule prompting a TRUS biopsy of the prostate on 10/17/18. 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 Staging imaging was negative for metastatic disease.   Family history: None.   Imaging studies:  Bone scan (10/26/18): Negative for metastases.  CT abd/pelvis (10/27/18: Negative for metastases.   PMH: He has a history of hypertension and hyperlipidemia.  PSH: No abdominal surgery.   TNM stage: cT3a N0 M0 (right nodularity/induration)  PSA: 20.7  Gleason score: 4+3=7 (grade group 3)  Biopsy (10/17/18): 5/12 cores positive  Left: Benign  Right: R lateral apex (20%, 3+4=7), R mid (20%, 4+3=7), R lateral mid (50%, 3+4=7), R base (50%, 3+4=7), R lateral base (90%, 4+3=7)  Prostate volume: 52.2 cc   Nomogram  OC disease: 17%  EPE: 79%  SVI: 24%  LNI: 31%  PFS (5 year, 10 year): 33%, 21%   Urinary function: IPSS is 2.  Erectile function: SHIM score is 25.     ALLERGIES: None   MEDICATIONS: Blood Pressure Medications  Cholesterol Med     GU PSH: Locm 300-399Mg /Ml Iodine,1Ml - 10/27/2018 Prostate Needle Biopsy - 10/17/2018    NON-GU PSH: Leg surgery (unspecified) Surgical Pathology, Gross And Microscopic Examination For Prostate Needle - 10/17/2018    GU PMH: Renal cyst, Right, A simple cyst of the right kidney was identified incidentally on his CT scan. This is of no clinical significance. - 11/08/2018 Prostate Cancer, He will be scheduled for a CT scan and bone scan for metastatic workup and then return for follow-up with me to go over the results of his metastatic evaluation and discuss options for management. - 10/24/2018 Elevated PSA - 10/17/2018, (Stable), He has a markedly elevated prostate which fortunately has remained stable over the past 3 years however my recommendation  3 years ago remains the same as it is now and that is that we proceed with a prostate biopsy., - 09/19/2018, Elevated PSA, - 2017 Prostate nodule w/o LUTS, His prostate now has a new nodule involving the right mid to apical region very concerning for prostate cancer with a PSA of 20 he will be scheduled for a prostate biopsy. - 09/19/2018    NON-GU PMH: Encounter for general adult medical examination without abnormal findings, Encounter for preventive health examination Hypercholesterolemia Hypertension    FAMILY HISTORY: 3 Son's - Son   SOCIAL HISTORY: Marital Status: Married Preferred Language: English; Ethnicity: Not Hispanic Or Latino; Race: Black or African American Current Smoking Status: Patient has never smoked.   Tobacco Use Assessment Completed: Used Tobacco in last 30 days? Has never drank.  Drinks 1 caffeinated drink per day.    REVIEW OF SYSTEMS:    GU Review Male:   Patient denies frequent urination, hard to postpone urination, burning/ pain with urination, get up at night to urinate, leakage of urine, stream starts and stops, trouble starting your streams, and have to strain to urinate .  Gastrointestinal (Lower):   Patient denies diarrhea and constipation.  Gastrointestinal (Upper):   Patient denies nausea and vomiting.  Constitutional:   Patient denies fatigue, weight loss, fever, and night sweats.  Skin:   Patient denies skin rash/ lesion and itching.  Eyes:   Patient denies blurred vision and double vision.  Ears/ Nose/ Throat:   Patient denies sore throat  and sinus problems.  Hematologic/Lymphatic:   Patient denies swollen glands and easy bruising.  Cardiovascular:   Patient denies leg swelling and chest pains.  Respiratory:   Patient denies cough and shortness of breath.  Endocrine:   Patient denies excessive thirst.  Musculoskeletal:   Patient denies back pain and joint pain.  Neurological:   Patient denies headaches and dizziness.  Psychologic:   Patient denies  depression and anxiety.   VITAL SIGNS:     Weight 240 lb / 108.86 kg  Height 73 in / 185.42 cm  BMI 31.7 kg/m    MULTI-SYSTEM PHYSICAL EXAMINATION:    Constitutional: Well-nourished. No physical deformities. Normally developed. Good grooming.  Neck: Neck symmetrical, not swollen. Normal tracheal position.  Respiratory: No labored breathing, no use of accessory muscles. Clear bilaterally.  Cardiovascular: Normal temperature, normal extremity pulses, no swelling, no varicosities. Regular rate and rhythm.  Lymphatic: No enlargement of neck, axillae, groin.  Skin: No paleness, no jaundice, no cyanosis. No lesion, no ulcer, no rash.  Neurologic / Psychiatric: Oriented to time, oriented to place, oriented to person. No depression, no anxiety, no agitation.  Gastrointestinal: No mass, no tenderness, no rigidity, non obese abdomen.  Eyes: Normal conjunctivae. Normal eyelids.  Ears, Nose, Mouth, and Throat: Left ear no scars, no lesions, no masses. Right ear no scars, no lesions, no masses. Nose no scars, no lesions, no masses. Normal hearing. Normal lips.  Musculoskeletal: Normal gait and station of head and neck.       EXAM:  CT ABDOMEN AND PELVIS WITH CONTRAST   TECHNIQUE:  Multidetector CT imaging of the abdomen and pelvis was performed  using the standard protocol following bolus administration of  intravenous contrast.   CONTRAST: 100 mL Omnipaque 300 IV   COMPARISON: CT abdomen/pelvis dated 07/16/2013   FINDINGS:  Lower chest: Lung bases are clear.   Hepatobiliary: 7 mm probable cyst in segment 8 (series 2/image 15).   Gallbladder is unremarkable. No intrahepatic or extrahepatic ductal  dilatation.   Pancreas: Within normal limits.   Spleen: Within normal limits.   Adrenals/Urinary Tract: Adrenal glands are within normal limits.   11 mm cyst in the posterior interpolar right kidney (series 5/image  33). Left kidney is within normal limits. No hydronephrosis.    Thick-walled bladder, although underdistended.   Stomach/Bowel: Stomach is within normal limits.   No evidence of bowel obstruction.   Normal appendix (series 2/image 40).   Mild left colonic diverticulosis, without evidence of  diverticulitis.   Vascular/Lymphatic: No evidence of abdominal aortic aneurysm.   Atherosclerotic calcifications of the abdominal aorta and branch  vessels.   No suspicious abdominopelvic lymphadenopathy.   Reproductive: Prostate is grossly unremarkable in this patient with  newly diagnosed prostate cancer.   Other: No abdominopelvic ascites.   Musculoskeletal: Degenerative changes of the visualized  thoracolumbar spine.   No focal osseous lesions.   IMPRESSION:  No findings suspicious for metastatic disease in this patient with  newly diagnosed prostate cancer.    Electronically Signed  By: Julian Hy M.D.  On: 10/27/2018 13:15   CLINICAL DATA: History of prostate cancer.   EXAM:  NUCLEAR MEDICINE WHOLE BODY BONE SCAN   TECHNIQUE:  Whole body anterior and posterior images were obtained approximately  3 hours after intravenous injection of radiopharmaceutical.   RADIOPHARMACEUTICALS: 20.8 mCi Technetium-66m MDP IV   COMPARISON: None.   FINDINGS:  Abnormal uptake is noted throughout the right tibia consistent with  history of prior intramedullary rod fixation.  Abnormal uptake is  noted in both knees consistent with degenerative change. Abnormal  uptake is seen involving the right side of the lumbar spine at the  L5 level which most likely is degenerative in origin. No other areas  of abnormal uptake are noted.   IMPRESSION:  Postsurgical and degenerative changes as described above. No  definite scintigraphic evidence of osseous metastases.    Electronically Signed  By: Marijo Conception M.D.  On: 10/26/2018 14:42      ASSESSMENT:      ICD-10 Details  1 GU:   Prostate Cancer - C61    PLAN:         1. Locally  advanced prostate cancer:   He does wish to proceed with surgical treatment and will be scheduled for a unilateral left nerve-sparing robot assisted laparoscopic radical prostatectomy and bilateral pelvic lymphadenectomy. He does understand the potential for necessitating adjuvant or salvage therapy based on his high risk situation.

## 2019-01-06 ENCOUNTER — Other Ambulatory Visit (HOSPITAL_COMMUNITY): Payer: Medicare Other

## 2019-01-08 ENCOUNTER — Encounter (HOSPITAL_COMMUNITY): Payer: Self-pay | Admitting: *Deleted

## 2019-01-08 ENCOUNTER — Ambulatory Visit (HOSPITAL_COMMUNITY): Payer: Medicare Other | Admitting: Anesthesiology

## 2019-01-08 ENCOUNTER — Ambulatory Visit (HOSPITAL_COMMUNITY): Payer: Medicare Other | Admitting: Emergency Medicine

## 2019-01-08 ENCOUNTER — Observation Stay (HOSPITAL_COMMUNITY)
Admission: RE | Admit: 2019-01-08 | Discharge: 2019-01-09 | Disposition: A | Discharge status: Home / Self Care | Payer: Medicare Other | Attending: Urology | Admitting: Urology

## 2019-01-08 ENCOUNTER — Encounter (HOSPITAL_COMMUNITY)
Admission: RE | Disposition: A | Discharge status: Home / Self Care | Payer: Self-pay | Source: Home / Self Care | Attending: Urology

## 2019-01-08 ENCOUNTER — Other Ambulatory Visit: Payer: Self-pay

## 2019-01-08 DIAGNOSIS — I10 Essential (primary) hypertension: Secondary | ICD-10-CM | POA: Insufficient documentation

## 2019-01-08 DIAGNOSIS — Z1159 Encounter for screening for other viral diseases: Secondary | ICD-10-CM | POA: Diagnosis not present

## 2019-01-08 DIAGNOSIS — Z23 Encounter for immunization: Secondary | ICD-10-CM | POA: Insufficient documentation

## 2019-01-08 DIAGNOSIS — C61 Malignant neoplasm of prostate: Principal | ICD-10-CM | POA: Insufficient documentation

## 2019-01-08 DIAGNOSIS — E7800 Pure hypercholesterolemia, unspecified: Secondary | ICD-10-CM | POA: Insufficient documentation

## 2019-01-08 DIAGNOSIS — Z79899 Other long term (current) drug therapy: Secondary | ICD-10-CM | POA: Insufficient documentation

## 2019-01-08 DIAGNOSIS — E785 Hyperlipidemia, unspecified: Secondary | ICD-10-CM | POA: Insufficient documentation

## 2019-01-08 HISTORY — PX: LYMPHADENECTOMY: SHX5960

## 2019-01-08 HISTORY — PX: ROBOT ASSISTED LAPAROSCOPIC RADICAL PROSTATECTOMY: SHX5141

## 2019-01-08 LAB — TYPE AND SCREEN
ABO/RH(D): O POS
Antibody Screen: NEGATIVE

## 2019-01-08 LAB — HEMOGLOBIN AND HEMATOCRIT, BLOOD
HCT: 41.4 % (ref 39.0–52.0)
Hemoglobin: 13.6 g/dL (ref 13.0–17.0)

## 2019-01-08 SURGERY — XI ROBOTIC ASSISTED LAPAROSCOPIC RADICAL PROSTATECTOMY LEVEL 2
Anesthesia: General

## 2019-01-08 MED ORDER — STERILE WATER FOR IRRIGATION IR SOLN
Status: DC | PRN
  Administered 2019-01-08: 1000 mL

## 2019-01-08 MED ORDER — DOCUSATE SODIUM 100 MG PO CAPS
100.0000 mg | ORAL_CAPSULE | Freq: Two times a day (BID) | ORAL | Status: DC
  Administered 2019-01-08 – 2019-01-09 (×2): 100 mg via ORAL
  Filled 2019-01-08 (×2): qty 1

## 2019-01-08 MED ORDER — SODIUM CHLORIDE 0.9 % IV SOLN
INTRAVENOUS | Status: DC | PRN
  Administered 2019-01-08: 10:00:00 40 ug/min via INTRAVENOUS

## 2019-01-08 MED ORDER — IRBESARTAN 150 MG PO TABS
75.0000 mg | ORAL_TABLET | Freq: Every day | ORAL | Status: DC
  Administered 2019-01-08 – 2019-01-09 (×2): 75 mg via ORAL
  Filled 2019-01-08 (×2): qty 1

## 2019-01-08 MED ORDER — SODIUM CHLORIDE 0.9 % IV BOLUS
1000.0000 mL | Freq: Once | INTRAVENOUS | Status: AC
  Administered 2019-01-08: 1000 mL via INTRAVENOUS

## 2019-01-08 MED ORDER — LIDOCAINE 2% (20 MG/ML) 5 ML SYRINGE
INTRAMUSCULAR | Status: AC
  Filled 2019-01-08: qty 5

## 2019-01-08 MED ORDER — BELLADONNA ALKALOIDS-OPIUM 16.2-60 MG RE SUPP: 1.0000 | Freq: Four times a day (QID) | RECTAL | Status: DC | PRN

## 2019-01-08 MED ORDER — CEFAZOLIN SODIUM-DEXTROSE 1-4 GM/50ML-% IV SOLN
1.0000 g | Freq: Three times a day (TID) | INTRAVENOUS | Status: AC
  Administered 2019-01-08 (×2): 1 g via INTRAVENOUS
  Filled 2019-01-08 (×3): qty 50

## 2019-01-08 MED ORDER — EPHEDRINE SULFATE-NACL 50-0.9 MG/10ML-% IV SOSY
PREFILLED_SYRINGE | INTRAVENOUS | Status: DC | PRN
  Administered 2019-01-08 (×2): 5 mg via INTRAVENOUS

## 2019-01-08 MED ORDER — EPHEDRINE 5 MG/ML INJ
INTRAVENOUS | Status: AC
  Filled 2019-01-08: qty 10

## 2019-01-08 MED ORDER — FENTANYL CITRATE (PF) 250 MCG/5ML IJ SOLN
INTRAMUSCULAR | Status: AC
  Filled 2019-01-08: qty 5

## 2019-01-08 MED ORDER — ROCURONIUM BROMIDE 10 MG/ML (PF) SYRINGE
PREFILLED_SYRINGE | INTRAVENOUS | Status: DC | PRN
  Administered 2019-01-08: 5 mg via INTRAVENOUS
  Administered 2019-01-08: 20 mg via INTRAVENOUS
  Administered 2019-01-08: 60 mg via INTRAVENOUS
  Administered 2019-01-08: 20 mg via INTRAVENOUS
  Administered 2019-01-08: 10 mg via INTRAVENOUS

## 2019-01-08 MED ORDER — ACETAMINOPHEN 325 MG PO TABS: 650.0000 mg | ORAL_TABLET | ORAL | Status: DC | PRN

## 2019-01-08 MED ORDER — DIPHENHYDRAMINE HCL 12.5 MG/5ML PO ELIX: 12.5000 mg | ORAL_SOLUTION | Freq: Four times a day (QID) | ORAL | Status: DC | PRN

## 2019-01-08 MED ORDER — BUPIVACAINE-EPINEPHRINE (PF) 0.25% -1:200000 IJ SOLN
INTRAMUSCULAR | Status: AC
  Filled 2019-01-08: qty 30

## 2019-01-08 MED ORDER — ONDANSETRON HCL 4 MG/2ML IJ SOLN
INTRAMUSCULAR | Status: AC
  Filled 2019-01-08: qty 2

## 2019-01-08 MED ORDER — HYDROMORPHONE HCL 1 MG/ML IJ SOLN
INTRAMUSCULAR | Status: DC | PRN
  Administered 2019-01-08 (×2): 0.5 mg via INTRAVENOUS

## 2019-01-08 MED ORDER — ONDANSETRON HCL 4 MG/2ML IJ SOLN
INTRAMUSCULAR | Status: DC | PRN
  Administered 2019-01-08: 4 mg via INTRAVENOUS

## 2019-01-08 MED ORDER — FENTANYL CITRATE (PF) 100 MCG/2ML IJ SOLN
INTRAMUSCULAR | Status: DC | PRN
  Administered 2019-01-08: 50 ug via INTRAVENOUS
  Administered 2019-01-08: 100 ug via INTRAVENOUS
  Administered 2019-01-08 (×2): 50 ug via INTRAVENOUS

## 2019-01-08 MED ORDER — PROPOFOL 10 MG/ML IV BOLUS
INTRAVENOUS | Status: AC
  Filled 2019-01-08: qty 20

## 2019-01-08 MED ORDER — BACITRACIN-NEOMYCIN-POLYMYXIN 400-5-5000 EX OINT: 1.0000 "application " | TOPICAL_OINTMENT | Freq: Three times a day (TID) | CUTANEOUS | Status: DC | PRN

## 2019-01-08 MED ORDER — DEXAMETHASONE SODIUM PHOSPHATE 10 MG/ML IJ SOLN
INTRAMUSCULAR | Status: DC | PRN
  Administered 2019-01-08: 10 mg via INTRAVENOUS

## 2019-01-08 MED ORDER — GLYCOPYRROLATE 0.2 MG/ML IJ SOLN
INTRAMUSCULAR | Status: DC | PRN
  Administered 2019-01-08: 0.2 mg via INTRAVENOUS

## 2019-01-08 MED ORDER — KCL IN DEXTROSE-NACL 20-5-0.45 MEQ/L-%-% IV SOLN
INTRAVENOUS | Status: DC
  Administered 2019-01-08 – 2019-01-09 (×3): via INTRAVENOUS
  Filled 2019-01-08 (×3): qty 1000

## 2019-01-08 MED ORDER — GLYCOPYRROLATE PF 0.2 MG/ML IJ SOSY
PREFILLED_SYRINGE | INTRAMUSCULAR | Status: AC
  Filled 2019-01-08: qty 1

## 2019-01-08 MED ORDER — HYDROMORPHONE HCL 2 MG/ML IJ SOLN
INTRAMUSCULAR | Status: AC
  Filled 2019-01-08: qty 1

## 2019-01-08 MED ORDER — SULFAMETHOXAZOLE-TRIMETHOPRIM 800-160 MG PO TABS: 1.0000 | ORAL_TABLET | Freq: Two times a day (BID) | ORAL | 0 refills | Status: DC

## 2019-01-08 MED ORDER — ZOLPIDEM TARTRATE 5 MG PO TABS: 5.0000 mg | ORAL_TABLET | Freq: Every evening | ORAL | Status: DC | PRN

## 2019-01-08 MED ORDER — PROPOFOL 10 MG/ML IV BOLUS
INTRAVENOUS | Status: DC | PRN
  Administered 2019-01-08: 100 mg via INTRAVENOUS

## 2019-01-08 MED ORDER — CEFAZOLIN SODIUM-DEXTROSE 2-4 GM/100ML-% IV SOLN
2.0000 g | Freq: Once | INTRAVENOUS | Status: AC
  Administered 2019-01-08: 2 g via INTRAVENOUS
  Filled 2019-01-08: qty 100

## 2019-01-08 MED ORDER — ATORVASTATIN CALCIUM 20 MG PO TABS
20.0000 mg | ORAL_TABLET | ORAL | Status: DC
  Administered 2019-01-09: 20 mg via ORAL
  Filled 2019-01-08: qty 1

## 2019-01-08 MED ORDER — ROCURONIUM BROMIDE 10 MG/ML (PF) SYRINGE
PREFILLED_SYRINGE | INTRAVENOUS | Status: AC
  Filled 2019-01-08: qty 20

## 2019-01-08 MED ORDER — MIDAZOLAM HCL 2 MG/2ML IJ SOLN
INTRAMUSCULAR | Status: AC
  Filled 2019-01-08: qty 2

## 2019-01-08 MED ORDER — AMLODIPINE BESYLATE 5 MG PO TABS
5.0000 mg | ORAL_TABLET | Freq: Every day | ORAL | Status: DC
  Administered 2019-01-08 – 2019-01-09 (×2): 5 mg via ORAL
  Filled 2019-01-08 (×2): qty 1

## 2019-01-08 MED ORDER — DEXAMETHASONE SODIUM PHOSPHATE 10 MG/ML IJ SOLN
INTRAMUSCULAR | Status: AC
  Filled 2019-01-08: qty 1

## 2019-01-08 MED ORDER — ONDANSETRON HCL 4 MG/2ML IJ SOLN: 4.0000 mg | INTRAMUSCULAR | Status: DC | PRN

## 2019-01-08 MED ORDER — TRAMADOL HCL 50 MG PO TABS: 50.0000 mg | ORAL_TABLET | Freq: Four times a day (QID) | ORAL | 0 refills | Status: DC | PRN

## 2019-01-08 MED ORDER — DIPHENHYDRAMINE HCL 50 MG/ML IJ SOLN: 12.5000 mg | Freq: Four times a day (QID) | INTRAMUSCULAR | Status: DC | PRN

## 2019-01-08 MED ORDER — LACTATED RINGERS IV SOLN
INTRAVENOUS | Status: DC | PRN
  Administered 2019-01-08: 1000 mL

## 2019-01-08 MED ORDER — HEPARIN SODIUM (PORCINE) 1000 UNIT/ML IJ SOLN
INTRAMUSCULAR | Status: AC
  Filled 2019-01-08: qty 1

## 2019-01-08 MED ORDER — BUPIVACAINE-EPINEPHRINE 0.25% -1:200000 IJ SOLN
INTRAMUSCULAR | Status: DC | PRN
  Administered 2019-01-08: 20 mL

## 2019-01-08 MED ORDER — KETOROLAC TROMETHAMINE 15 MG/ML IJ SOLN
15.0000 mg | Freq: Four times a day (QID) | INTRAMUSCULAR | Status: DC
  Administered 2019-01-08 – 2019-01-09 (×3): 15 mg via INTRAVENOUS
  Filled 2019-01-08 (×3): qty 1

## 2019-01-08 MED ORDER — PNEUMOCOCCAL VAC POLYVALENT 25 MCG/0.5ML IJ INJ
0.5000 mL | INJECTION | INTRAMUSCULAR | Status: AC
  Administered 2019-01-09: 0.5 mL via INTRAMUSCULAR
  Filled 2019-01-08: qty 0.5

## 2019-01-08 MED ORDER — MORPHINE SULFATE (PF) 2 MG/ML IV SOLN: 2.0000 mg | INTRAVENOUS | Status: DC | PRN

## 2019-01-08 MED ORDER — LACTATED RINGERS IV SOLN
INTRAVENOUS | Status: DC
  Administered 2019-01-08 (×2): via INTRAVENOUS

## 2019-01-08 MED ORDER — SODIUM CHLORIDE 0.9 % IR SOLN
Status: DC | PRN
  Administered 2019-01-08: 1000 mL via INTRAVESICAL

## 2019-01-08 MED ORDER — MIDAZOLAM HCL 5 MG/5ML IJ SOLN
INTRAMUSCULAR | Status: DC | PRN
  Administered 2019-01-08: 2 mg via INTRAVENOUS

## 2019-01-08 MED ORDER — LIDOCAINE 2% (20 MG/ML) 5 ML SYRINGE
INTRAMUSCULAR | Status: DC | PRN
  Administered 2019-01-08: 50 mg via INTRAVENOUS

## 2019-01-08 SURGICAL SUPPLY — 59 items
ADH SKN CLS APL DERMABOND .7 (GAUZE/BANDAGES/DRESSINGS) ×2
APL PRP STRL LF DISP 70% ISPRP (MISCELLANEOUS) ×2
APL SWBSTK 6 STRL LF DISP (MISCELLANEOUS) ×2
APPLICATOR COTTON TIP 6 STRL (MISCELLANEOUS) ×2 IMPLANT
APPLICATOR COTTON TIP 6IN STRL (MISCELLANEOUS) ×4
CATH FOLEY 2WAY SLVR 18FR 30CC (CATHETERS) ×4 IMPLANT
CATH ROBINSON RED A/P 16FR (CATHETERS) ×4 IMPLANT
CATH ROBINSON RED A/P 8FR (CATHETERS) ×2 IMPLANT
CATH TIEMANN FOLEY 18FR 5CC (CATHETERS) ×4 IMPLANT
CHLORAPREP W/TINT 26 (MISCELLANEOUS) ×4 IMPLANT
CLIP VESOLOCK LG 6/CT PURPLE (CLIP) ×8 IMPLANT
COVER SURGICAL LIGHT HANDLE (MISCELLANEOUS) ×4 IMPLANT
COVER TIP SHEARS 8 DVNC (MISCELLANEOUS) ×2 IMPLANT
COVER TIP SHEARS 8MM DA VINCI (MISCELLANEOUS) ×2
COVER WAND RF STERILE (DRAPES) IMPLANT
CUTTER ECHEON FLEX ENDO 45 340 (ENDOMECHANICALS) ×4 IMPLANT
DECANTER SPIKE VIAL GLASS SM (MISCELLANEOUS) ×4 IMPLANT
DERMABOND ADVANCED (GAUZE/BANDAGES/DRESSINGS) ×2
DERMABOND ADVANCED .7 DNX12 (GAUZE/BANDAGES/DRESSINGS) ×2 IMPLANT
DRAPE ARM DVNC X/XI (DISPOSABLE) ×8 IMPLANT
DRAPE COLUMN DVNC XI (DISPOSABLE) ×2 IMPLANT
DRAPE DA VINCI XI ARM (DISPOSABLE) ×8
DRAPE DA VINCI XI COLUMN (DISPOSABLE) ×2
DRAPE SURG IRRIG POUCH 19X23 (DRAPES) ×4 IMPLANT
DRSG TEGADERM 4X4.75 (GAUZE/BANDAGES/DRESSINGS) ×4 IMPLANT
ELECT PENCIL ROCKER SW 15FT (MISCELLANEOUS) ×2 IMPLANT
ELECT REM PT RETURN 15FT ADLT (MISCELLANEOUS) ×4 IMPLANT
GLOVE BIO SURGEON STRL SZ 6.5 (GLOVE) ×3 IMPLANT
GLOVE BIO SURGEONS STRL SZ 6.5 (GLOVE) ×1
GLOVE BIOGEL M STRL SZ7.5 (GLOVE) ×8 IMPLANT
GOWN STRL REUS W/TWL LRG LVL3 (GOWN DISPOSABLE) ×12 IMPLANT
HOLDER FOLEY CATH W/STRAP (MISCELLANEOUS) ×4 IMPLANT
IRRIG SUCT STRYKERFLOW 2 WTIP (MISCELLANEOUS) ×4
IRRIGATION SUCT STRKRFLW 2 WTP (MISCELLANEOUS) ×2 IMPLANT
IV LACTATED RINGERS 1000ML (IV SOLUTION) ×4 IMPLANT
KIT TURNOVER KIT A (KITS) IMPLANT
NDL SAFETY ECLIPSE 18X1.5 (NEEDLE) ×2 IMPLANT
NEEDLE HYPO 18GX1.5 SHARP (NEEDLE) ×4
PACK ROBOT UROLOGY CUSTOM (CUSTOM PROCEDURE TRAY) ×4 IMPLANT
RELOAD STAPLE 45 4.1 GRN THCK (STAPLE) ×2 IMPLANT
SEAL CANN UNIV 5-8 DVNC XI (MISCELLANEOUS) ×8 IMPLANT
SEAL XI 5MM-8MM UNIVERSAL (MISCELLANEOUS) ×8
SOLUTION ELECTROLUBE (MISCELLANEOUS) ×4 IMPLANT
STAPLE RELOAD 45 GRN (STAPLE) ×2 IMPLANT
STAPLE RELOAD 45MM GREEN (STAPLE) ×4
SUT ETHILON 3 0 PS 1 (SUTURE) ×4 IMPLANT
SUT MNCRL 3 0 RB1 (SUTURE) ×2 IMPLANT
SUT MNCRL 3 0 VIOLET RB1 (SUTURE) ×2 IMPLANT
SUT MNCRL AB 4-0 PS2 18 (SUTURE) ×8 IMPLANT
SUT MONOCRYL 3 0 RB1 (SUTURE) ×4
SUT VIC AB 0 CT1 27 (SUTURE) ×4
SUT VIC AB 0 CT1 27XBRD ANTBC (SUTURE) ×2 IMPLANT
SUT VIC AB 0 UR5 27 (SUTURE) ×4 IMPLANT
SUT VIC AB 2-0 SH 27 (SUTURE) ×4
SUT VIC AB 2-0 SH 27X BRD (SUTURE) ×2 IMPLANT
SUT VICRYL 0 UR6 27IN ABS (SUTURE) ×8 IMPLANT
SYR 27GX1/2 1ML LL SAFETY (SYRINGE) ×4 IMPLANT
TOWEL OR NON WOVEN STRL DISP B (DISPOSABLE) ×4 IMPLANT
WATER STERILE IRR 1000ML POUR (IV SOLUTION) ×4 IMPLANT

## 2019-01-08 NOTE — Discharge Instructions (Signed)

## 2019-01-08 NOTE — Anesthesia Postprocedure Evaluation (Signed)
Anesthesia Post Note  Patient: Torri Langston  Procedure(s) Performed: XI ROBOTIC ASSISTED LAPAROSCOPIC RADICAL PROSTATECTOMY LEVEL 2 (N/A ) LYMPHADENECTOMY, PELVIC BILATERAL (Bilateral )     Patient location during evaluation: PACU Anesthesia Type: General Level of consciousness: awake and alert Pain management: pain level controlled Vital Signs Assessment: post-procedure vital signs reviewed and stable Respiratory status: spontaneous breathing, nonlabored ventilation, respiratory function stable and patient connected to nasal cannula oxygen Cardiovascular status: blood pressure returned to baseline and stable Postop Assessment: no apparent nausea or vomiting Anesthetic complications: no    Last Vitals:  Vitals:   01/08/19 1200 01/08/19 1215  BP: (!) 156/91 (!) 156/90  Pulse: 67 73  Resp: 19 19  Temp:    SpO2: 98% 100%    Last Pain:  Vitals:   01/08/19 1215  TempSrc:   PainSc: 0-No pain                 Alayla Dethlefs S

## 2019-01-08 NOTE — Op Note (Signed)
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 Robotic assisted laparoscopic radical prostatectomy (left nerve sparing) 2. Bilateral robotic assisted laparoscopic pelvic lymphadenectomy  Surgeon: Pryor Curia. M.D.  Assistant(s): Debbrah Alar, PA-C  An assistant was required for this surgical procedure.  The duties of the assistant included but were not limited to suctioning, passing suture, camera manipulation, retraction. This procedure would not be able to be performed without an Environmental consultant.   Resident: Dr. Tharon Aquas  Anesthesia: General  Complications: None  EBL: 50 mL  IVF:  1500 mL crystalloid  Specimens: 1. Prostate and seminal vesicles 2. Right pelvic lymph nodes 3. Left pelvic lymph nodes  Disposition of specimens: Pathology  Drains: 1. 20 Fr coude catheter 2. # 19 Blake pelvic drain  Indication: [name] [name] is a 68 y.o. patient with clinically localized prostate cancer.  After a thorough review of the management options for treatment of prostate cancer, he elected to proceed with surgical therapy and the above procedure(s).  We have discussed the potential benefits and risks of the procedure, side effects of the proposed treatment, the likelihood of the patient achieving the goals of the procedure, and any potential problems that might occur during the procedure or recuperation. Informed consent has been obtained.  Description of procedure:  The patient was taken to the operating room and a general anesthetic was administered. He was given preoperative antibiotics, placed in the dorsal lithotomy position, and prepped and draped in the usual sterile fashion. Next a preoperative timeout was performed. A urethral catheter was placed into the bladder and a site was selected near the  umbilicus for placement of the camera port. This was placed using a standard open Hassan technique which allowed entry into the peritoneal cavity under direct vision and without difficulty. An 8 mm port was placed and a pneumoperitoneum established. The camera was then used to inspect the abdomen and there was no evidence of any intra-abdominal injuries or other abnormalities. The remaining abdominal ports were then placed. 8 mm robotic ports were placed in the right lower quadrant, left lower quadrant, and far left lateral abdominal wall. A 5 mm port was placed in the right upper quadrant and a 12 mm port was placed in the right lateral abdominal wall for laparoscopic assistance. All ports were placed under direct vision without difficulty. The surgical cart was then docked.   Utilizing the cautery scissors, the bladder was reflected posteriorly allowing entry into the space of Retzius and identification of the endopelvic fascia and prostate. The periprostatic fat was then removed from the prostate allowing full exposure of the endopelvic fascia. The endopelvic fascia was then incised from the apex back to the base of the prostate bilaterally and the underlying levator muscle fibers were swept laterally off the prostate thereby isolating the dorsal venous complex. The dorsal vein was then stapled and divided with a 45 mm Flex Echelon stapler. Attention then turned to the bladder neck which was divided anteriorly thereby allowing entry into the bladder and exposure of the urethral catheter. The catheter balloon was deflated and the catheter was brought into the operative field and used to retract the prostate anteriorly. The posterior bladder neck was then examined and was divided allowing further dissection between the bladder and prostate posteriorly until the vasa deferentia and seminal vessels were identified. The vasa deferentia were isolated, divided, and  lifted anteriorly. The seminal vesicles were  dissected down to their tips with care to control the seminal vascular arterial blood supply. These structures were then lifted anteriorly and the space between Denonvillier's fascia and the anterior rectum was developed with a combination of sharp and blunt dissection. This isolated the vascular pedicles of the prostate.  The lateral prostatic fascia on the left side of the prostate was then sharply incised allowing release of the neurovascular bundle. The vascular pedicle of the prostate on the left side was then ligated with Weck clips between the prostate and neurovascular bundle and divided with sharp cold scissor dissection resulting in neurovascular bundle preservation. On the right side, a wide non nerve sparing dissection was performed with Weck clips used to ligate the vascular pedicle of the prostate. The neurovascular bundle on the left side was then separated off the apex of the prostate and urethra.   The urethra was then sharply transected allowing the prostate specimen to be disarticulated. The pelvis was copiously irrigated and hemostasis was ensured. There was no evidence for rectal injury.  Attention then turned to the right pelvic sidewall. The fibrofatty tissue between the external iliac vein, confluence of the iliac vessels, hypogastric artery, and Cooper's ligament was dissected free from the pelvic sidewall with care to preserve the obturator nerve. Weck clips were used for lymphostasis and hemostasis. An identical procedure was performed on the contralateral side and the lymphatic packets were removed for permanent pathologic analysis.  Attention then turned to the urethral anastomosis. A 2-0 Vicryl slip knot was placed between Denonvillier's fascia, the posterior bladder neck, and the posterior urethra to reapproximate these structures. A double-armed 3-0 Monocryl suture was then used to perform a 360 running tension-free anastomosis between the bladder neck and urethra. A new  urethral catheter was then placed into the bladder and irrigated. There were no blood clots within the bladder and the anastomosis appeared to be watertight. A #19 Blake drain was then brought through the left lateral 8 mm port site and positioned appropriately within the pelvis. It was secured to the skin with a nylon suture. The surgical cart was then undocked. The right lateral 12 mm port site was closed at the fascial level with a 0 Vicryl suture placed laparoscopically. All remaining ports were then removed under direct vision. The prostate specimen was removed intact within the Endopouch retrieval bag via the periumbilical camera port site. This fascial opening was closed with two running 0 Vicryl sutures. 0.25% Marcaine was then injected into all port sites and all incisions were reapproximated at the skin level with 4-0 Monocryl subcuticular sutures and Dermabond. The patient appeared to tolerate the procedure well and without complications. The patient was able to be extubated and transferred to the recovery unit in satisfactory condition.   Pryor Curia MD

## 2019-01-08 NOTE — Transfer of Care (Signed)
Immediate Anesthesia Transfer of Care Note  Patient: [name] [name]  Procedure(s) Performed: Procedure(s): XI ROBOTIC ASSISTED LAPAROSCOPIC RADICAL PROSTATECTOMY LEVEL 2 (N/A) LYMPHADENECTOMY, PELVIC BILATERAL (Bilateral)  Patient Location: PACU  Anesthesia Type:General  Level of Consciousness:  sedated, patient cooperative and responds to stimulation  Airway & Oxygen Therapy:Patient Spontanous Breathing and Patient connected to face mask oxgen  Post-op Assessment:  Report given to PACU RN and Post -op Vital signs reviewed and stable  Post vital signs:  Reviewed and stable  Last Vitals:  Vitals:   01/08/19 0527  BP: (!) 149/83  Pulse: (!) 48  Resp: 16  Temp: 36.6 C  SpO2: 37%    Complications: No apparent anesthesia complications

## 2019-01-08 NOTE — Anesthesia Procedure Notes (Signed)
Procedure Name: Intubation Date/Time: 01/08/2019 7:30 AM Performed by: Anne Fu, CRNA Pre-anesthesia Checklist: Patient identified, Emergency Drugs available, Suction available, Patient being monitored and Timeout performed Patient Re-evaluated:Patient Re-evaluated prior to induction Oxygen Delivery Method: Circle system utilized Preoxygenation: Pre-oxygenation with 100% oxygen Induction Type: IV induction Ventilation: Mask ventilation without difficulty Laryngoscope Size: Mac and 4 Grade View: Grade I Tube type: Oral Tube size: 7.5 mm Number of attempts: 1 Airway Equipment and Method: Stylet Placement Confirmation: ETT inserted through vocal cords under direct vision,  positive ETCO2 and breath sounds checked- equal and bilateral Secured at: 19 cm Tube secured with: Tape Dental Injury: Teeth and Oropharynx as per pre-operative assessment

## 2019-01-08 NOTE — Progress Notes (Addendum)
Urology Progress Note   Day of Surgery status post robotic prostatectomy  Subjective: Doing well.  Comfortable.  No nausea or vomiting.  PACU labs stable  Objective: Vital signs in last 24 hours: Temp:  [97.4 F (36.3 C)-97.8 F (36.6 C)] 97.8 F (36.6 C) (07/20 1242) Pulse Rate:  [48-73] 65 (07/20 1242) Resp:  [16-19] 19 (07/20 1242) BP: (138-156)/(77-92) 156/92 (07/20 1242) SpO2:  [97 %-100 %] 99 % (07/20 1242) Weight:  [106.4 kg] 106.4 kg (07/20 1242)  Intake/Output from previous day: No intake/output data recorded. Intake/Output this shift: Total I/O In: 1610 [I.V.:1510; IV Piggyback:100] Out: 265 [Urine:175; Drains:40; Blood:50]  Physical Exam:  General: Alert and oriented CV: RRR Lungs: Clear Abdomen: Soft, appropriately tender.  GU: Foley in place draining light red Ext: NT, No erythema  Lab Results: Recent Labs    01/08/19 1146  HGB 13.6  HCT 41.4   BMET No results for input(s): NA, K, CL, CO2, GLUCOSE, BUN, CREATININE, CALCIUM in the last 72 hours.   Studies/Results: No results found.  Assessment/Plan:  68 y.o. male s/p RALP.  Overall doing well post-op.     LOS: 0 days   Tharon Aquas 01/08/2019, 4:48 PM  I have seen and examined the patient and agree with the above assessment and plan.

## 2019-01-08 NOTE — Anesthesia Preprocedure Evaluation (Signed)
Anesthesia Evaluation  Patient identified by MRN, date of birth, ID band Patient awake    Reviewed: Allergy & Precautions, NPO status , Patient's Chart, lab work & pertinent test results  Airway Mallampati: II  TM Distance: >3 FB Neck ROM: Full    Dental no notable dental hx.    Pulmonary neg pulmonary ROS,    Pulmonary exam normal breath sounds clear to auscultation       Cardiovascular hypertension, Normal cardiovascular exam Rhythm:Regular Rate:Normal     Neuro/Psych negative neurological ROS  negative psych ROS   GI/Hepatic negative GI ROS, Neg liver ROS,   Endo/Other  negative endocrine ROS  Renal/GU negative Renal ROS  negative genitourinary   Musculoskeletal negative musculoskeletal ROS (+)   Abdominal   Peds negative pediatric ROS (+)  Hematology negative hematology ROS (+)   Anesthesia Other Findings   Reproductive/Obstetrics negative OB ROS                             Anesthesia Physical Anesthesia Plan  ASA: II  Anesthesia Plan: General   Post-op Pain Management:    Induction: Intravenous  PONV Risk Score and Plan: 2 and Dexamethasone, Ondansetron and Treatment may vary due to age or medical condition  Airway Management Planned: Oral ETT  Additional Equipment:   Intra-op Plan:   Post-operative Plan: Extubation in OR  Informed Consent: I have reviewed the patients History and Physical, chart, labs and discussed the procedure including the risks, benefits and alternatives for the proposed anesthesia with the patient or authorized representative who has indicated his/her understanding and acceptance.     Dental advisory given  Plan Discussed with: CRNA and Surgeon  Anesthesia Plan Comments:         Anesthesia Quick Evaluation

## 2019-01-09 ENCOUNTER — Encounter (HOSPITAL_COMMUNITY): Payer: Self-pay | Admitting: Urology

## 2019-01-09 DIAGNOSIS — C61 Malignant neoplasm of prostate: Secondary | ICD-10-CM | POA: Diagnosis not present

## 2019-01-09 LAB — HEMOGLOBIN AND HEMATOCRIT, BLOOD
HCT: 40.6 % (ref 39.0–52.0)
Hemoglobin: 13.6 g/dL (ref 13.0–17.0)

## 2019-01-09 MED ORDER — TRAMADOL HCL 50 MG PO TABS: 50.0000 mg | ORAL_TABLET | Freq: Four times a day (QID) | ORAL | Status: DC | PRN

## 2019-01-09 MED ORDER — BISACODYL 10 MG RE SUPP
10.0000 mg | Freq: Once | RECTAL | Status: AC
  Administered 2019-01-09: 10 mg via RECTAL
  Filled 2019-01-09: qty 1

## 2019-01-09 NOTE — Progress Notes (Signed)
Urology Progress Note   1 Day Post-Op status post robotic prostatectomy  Subjective: No acute events overnight.  Tolerated clears.  Catheter draining well and urine is lightened up this morning.  Pain controlled.  No flatus or bowel movement.  JP output as expected 40 overnight.  A.m. hemoglobin stable  Objective: Vital signs in last 24 hours: Temp:  [97.4 F (36.3 C)-98.6 F (37 C)] 98.2 F (36.8 C) (07/21 0624) Pulse Rate:  [55-73] 55 (07/21 0624) Resp:  [14-19] 14 (07/21 0624) BP: (126-156)/(77-92) 132/86 (07/21 0624) SpO2:  [96 %-100 %] 97 % (07/21 0624) Weight:  [106.4 kg] 106.4 kg (07/20 1242)  Intake/Output from previous day: 07/20 0701 - 07/21 0700 In: 4435.9 [P.O.:600; I.V.:3635.9; IV Piggyback:200] Out: 4500 [Urine:4300; Drains:150; Blood:50] Intake/Output this shift: No intake/output data recorded.  Physical Exam:  General: Alert and oriented CV: RRR Lungs: Normal work of breathing Abdomen: Soft, appropriately tender.  Incisions clean dry and intact with Dermabond GU: Foley in place draining yellow urine Ext: NT, No erythema  Lab Results: Recent Labs    01/08/19 1146 01/09/19 0538  HGB 13.6 13.6  HCT 41.4 40.6   BMET No results for input(s): NA, K, CL, CO2, GLUCOSE, BUN, CREATININE, CALCIUM in the last 72 hours.   Studies/Results: No results found.  Assessment/Plan:  68 y.o. male s/p RALP.  Overall doing well post-op.   -Discontinue fluids -Continue clear liquid diet until passing flatus -Remove JP drain -Ambulate every hour -Foley teaching - Likely discharge home later this morning   LOS: 0 days   Tharon Aquas 01/09/2019, 7:17 AM

## 2019-01-09 NOTE — Progress Notes (Signed)
Patient discharging home.  IV removed - WNL. Reviewed AVS and medications, instructed on foley care and management of foley at home. Patient able to demonstrate understanding, also spoke with wife over phone - no questions at this time.  Stressed importance of handwashing prior to foley care and changing of bags.  Encouraged to drink plenty of fluids and walk frequently to prevent blood blots and constipation.  Verbalized understanding.  Follow up in place.  Patient in NAD - waiting on wife to arrive for pick up

## 2019-01-09 NOTE — Discharge Summary (Signed)
[facility] Discharge Summary  Admit date: 01/08/2019  Discharge date and time: 01/09/19   Discharge to: Home  Discharge Service: Urology  Discharge Attending Physician:  Alinda Money  Discharge  Diagnoses: Prostate Cancer  OR Procedures: Procedure(s): XI ROBOTIC ASSISTED LAPAROSCOPIC RADICAL PROSTATECTOMY LEVEL 2 LYMPHADENECTOMY, PELVIC BILATERAL 01/08/2019   Ancillary Procedures: None   Discharge Day Services: The patient was seen and examined by the Urology team both in the morning and immediately prior to discharge.  Vital signs and laboratory values were stable and within normal limits.  The physical exam was benign and unchanged and all surgical wounds were examined.  Discharge instructions were explained and all questions answered.  Subjective  No acute events overnight. Pain Controlled. No fever or chills.  Objective Patient Vitals for the past 8 hrs:  BP Temp Temp src Pulse Resp SpO2  01/09/19 0624 132/86 98.2 F (36.8 C) Oral (!) 55 14 97 %   Total I/O In: -  Out: 80 [Drains:80]  General: Alert and oriented CV: RRR Lungs: Normal work of breathing Abdomen: Soft, appropriately tender.  Incisions clean dry and intact with Dermabond GU: Foley in place draining yellow urine Ext: NT, No erythema   Hospital Course:  The patient underwent robotic prostatectomy on 01/08/2019.  The patient tolerated the procedure well, was extubated in the OR, and afterwards was taken to the PACU for routine post-surgical care. When stable the patient was transferred to the floor.     The patient did well postoperatively.  The patient's diet was slowly advanced and at the time of discharge was tolerating a clear diet. Instructions to stay on soft diet for a few days and clears until passing flatus. Post op and AM labs. Stable and no issues on exam POD 1.   The patient was discharged home 1 Day Post-Op, at which point was tolerating a regular solid diet, able to care for the catheter as  instructed by nursing, have adequate pain control with P.O. pain medication, and could ambulate without difficulty.   The patient will follow up with [place] for post op check. This is already scheduled. Detailed discharge instructions were communicated with the patient directly and his wife by phone (who is the primary care giver).   Condition at Discharge: Improved  Discharge Medications:  Allergies as of 01/09/2019      Reactions   Doxycycline Other (See Comments)   Burns very fast if in the sunlight   Penicillins Itching   Liquid med makes pt itch, pill is ok to take  Did it involve swelling of the face/tongue/throat, SOB, or low BP? No Did it involve sudden or severe rash/hives, skin peeling, or any reaction on the inside of your mouth or nose? No Did you need to seek medical attention at a hospital or doctor's office? No When did it last happen?a long time ago  If all above answers are "NO", may proceed with cephalosporin use.      Medication List    TAKE these medications   amLODipine 5 MG tablet Commonly known as: NORVASC Take 1 tablet (5 mg total) by mouth daily. What changed: when to take this   atorvastatin 20 MG tablet Commonly known as: LIPITOR TAKE 1 TABLET(20 MG) BY MOUTH DAILY What changed: See the new instructions.   irbesartan 75 MG tablet Commonly known as: AVAPRO Take 1 tablet (75 mg total) by mouth daily. What changed: when to take this   sulfamethoxazole-trimethoprim 800-160 MG tablet Commonly known as: BACTRIM DS Take  1 tablet by mouth 2 (two) times daily. Start the day prior to foley removal appointment   traMADol 50 MG tablet Commonly known as: Ultram Take 1-2 tablets (50-100 mg total) by mouth every 6 (six) hours as needed for moderate pain or severe pain.

## 2019-03-23 ENCOUNTER — Ambulatory Visit: Payer: Medicare Other

## 2019-04-06 ENCOUNTER — Ambulatory Visit: Payer: Medicare Other

## 2019-07-28 ENCOUNTER — Other Ambulatory Visit: Payer: Self-pay | Admitting: Family

## 2019-08-09 ENCOUNTER — Other Ambulatory Visit: Payer: Self-pay | Admitting: Family

## 2019-08-19 ENCOUNTER — Ambulatory Visit: Payer: Medicare Other | Attending: Internal Medicine

## 2019-08-19 DIAGNOSIS — Z23 Encounter for immunization: Secondary | ICD-10-CM | POA: Insufficient documentation

## 2019-08-19 NOTE — Progress Notes (Signed)
   Covid-19 Vaccination Clinic  Name:  Randel Narasimhan    MRN: [id number] DOB: [birth year]  08/19/2019  Mr. Shurtleff was observed post Covid-19 immunization for 15 minutes without incidence. He was provided with Vaccine Information Sheet and instruction to access the V-Safe system.   Mr. Hollomon was instructed to call 911 with any severe reactions post vaccine: Marland Kitchen Difficulty breathing  . Swelling of your face and throat  . A fast heartbeat  . A bad rash all over your body  . Dizziness and weakness    Immunizations Administered    Name Date Dose VIS Date Route   Pfizer COVID-19 Vaccine 08/19/2019  8:17 AM 0.3 mL 06/01/2019 Intramuscular   Manufacturer: [place]   Lot: [id number]   [place]: [id number]

## 2019-09-01 ENCOUNTER — Encounter (HOSPITAL_COMMUNITY): Payer: Self-pay | Admitting: Gynecology

## 2019-09-01 ENCOUNTER — Ambulatory Visit (HOSPITAL_COMMUNITY)
Admission: EM | Admit: 2019-09-01 | Discharge: 2019-09-01 | Disposition: A | Discharge status: Home / Self Care | Payer: Medicare Other

## 2019-09-01 ENCOUNTER — Other Ambulatory Visit: Payer: Self-pay

## 2019-09-01 DIAGNOSIS — R197 Diarrhea, unspecified: Secondary | ICD-10-CM | POA: Diagnosis not present

## 2019-09-01 HISTORY — DX: Pure hypercholesterolemia, unspecified: E78.00

## 2019-09-01 NOTE — ED Provider Notes (Signed)
[place]    CSN: [id number] Arrival date & time: 09/01/19  1718      History   Chief Complaint Chief Complaint  Patient presents with  . Diarrhea    HPI [name] [name] is a 69 y.o. male.   Patient reports he developed diarrhea last night.  He reports frequent watery diarrhea with abdominal cramping.  He reports being up and down frequently last night to use the bathroom.Marland Kitchen  He reports 3-4 episodes of diarrhea today.  He reports less abdominal cramping today.  He reports overall he is feeling better today with improved symptoms and less diarrhea..  Denies fever and chills.  Denies nausea or vomiting.  Denies blood in his stool.  He has continued to eat and drink normally today without issue.   He reports on his last few meals  were chicken tenders, fish and [employer].  No one else is sick at home with him.  He reports he does not wish to be Covid tested.      Past Medical History:  Diagnosis Date  . Hypercholesteremia   . Hypertension     Patient Active Problem List   Diagnosis Date Noted  . Prostate cancer ([place]) 01/08/2019  . Hyperglycemia 11/05/2017  . Nondisplaced fracture of medial malleolus of right tibia, initial encounter for closed fracture 02/12/2017  . Tibia fracture 02/11/2017  . Septic arthritis of interphalangeal joint of toe, right ([place]) 05/26/2016  . Essential hypertension 05/25/2016  . Hyperlipidemia 08/02/2015  . Elevated PSA 08/02/2015  . Chronic kidney disease 07/31/2015  . Legally blind in right eye, as defined in [place] 07/10/2015    Past Surgical History:  Procedure Laterality Date  . INCISION AND DRAINAGE OF WOUND Right 05/25/2016   Procedure: IRRIGATION AND DEBRIDEMENT WOUND RIGHT FOOT;  Surgeon: Edrick Kins, DPM;  Location: [facility];  Service: Podiatry;  Laterality: Right;  . LYMPHADENECTOMY Bilateral 01/08/2019   Procedure: LYMPHADENECTOMY, PELVIC BILATERAL;  Surgeon: Raynelle Bring, MD;  [facility];  Service:  Urology;  Laterality: Bilateral;  . ROBOT ASSISTED LAPAROSCOPIC RADICAL PROSTATECTOMY N/A 01/08/2019   Procedure: XI ROBOTIC ASSISTED LAPAROSCOPIC RADICAL PROSTATECTOMY LEVEL 2;  Surgeon: Raynelle Bring, MD;  Location[facility];  Service: Urology;  Laterality: N/A;  . TIBIA IM NAIL INSERTION Right 02/12/2017   Procedure: INTRAMEDULLARY (IM) NAIL TIBIAL, RIGHT;  Surgeon: Leandrew Koyanagi, MD;  Location: [facility];  Service: Orthopedics;  Laterality: Right;       Home Medications    Prior to Admission medications   Medication Sig Start Date End Date Taking? Authorizing Provider  amLODipine (NORVASC) 5 MG tablet TAKE 1 TABLET(5 MG) BY MOUTH DAILY 07/30/19  Yes Marrian Salvage, FNP  atorvastatin (LIPITOR) 20 MG tablet TAKE 1 TABLET(20 MG) BY MOUTH DAILY 08/10/19  Yes Marrian Salvage, FNP  irbesartan (AVAPRO) 75 MG tablet TAKE 1 TABLET(75 MG) BY MOUTH DAILY 07/30/19  Yes Marrian Salvage, FNP  sulfamethoxazole-trimethoprim (BACTRIM DS) 800-160 MG tablet Take 1 tablet by mouth 2 (two) times daily. Start the day prior to foley removal appointment 01/08/19   Debbrah Alar, PA-C  traMADol (ULTRAM) 50 MG tablet Take 1-2 tablets (50-100 mg total) by mouth every 6 (six) hours as needed for moderate pain or severe pain. 01/08/19   Debbrah Alar, PA-C    Family History Family History  Problem Relation Age of Onset  . Cancer Mother   . Heart disease Father   . Hypertension Father     Social History  Social History   Tobacco Use  . Smoking status: Never Smoker  . Smokeless tobacco: Never Used  Substance Use Topics  . Alcohol use: No  . Drug use: No     Allergies   Doxycycline and Penicillins   Review of Systems Review of Systems  Constitutional: Negative for chills and fever.  HENT: Negative.   Eyes: Negative for pain and visual disturbance.  Respiratory: Negative for cough and shortness of breath.   Cardiovascular: Negative for chest pain and palpitations.  Gastrointestinal:  Positive for abdominal pain and diarrhea. Negative for blood in stool, nausea and vomiting.  Genitourinary: Negative.  Negative for dysuria and hematuria.  Musculoskeletal: Negative for arthralgias and back pain.  Skin: Negative for color change and rash.  Neurological: Negative for headaches.  All other systems reviewed and are negative.    Physical Exam Triage Vital Signs ED Triage Vitals  Enc Vitals Group     BP      Pulse      Resp      Temp      Temp src      SpO2      Weight      Height      Head Circumference      Peak Flow      Pain Score      Pain Loc      Pain Edu?      Excl. in [place]?    No data found.  Updated Vital Signs BP (!) 159/88 (BP Location: Left Arm)   Pulse 66   Temp 98.9 F (37.2 C) (Oral)   Resp 16   Ht 6\' 1"  (1.854 m)   Wt 235 lb (106.6 kg)   SpO2 100%   BMI 31.00 kg/m   Visual Acuity Right Eye Distance:   Left Eye Distance:   Bilateral Distance:    Right Eye Near:   Left Eye Near:    Bilateral Near:     Physical Exam Vitals and nursing note reviewed.  Constitutional:      General: He is not in acute distress.    Appearance: Normal appearance. He is well-developed. He is not ill-appearing.  HENT:     Head: Normocephalic and atraumatic.  Eyes:     General: No scleral icterus.    Extraocular Movements: Extraocular movements intact.     Conjunctiva/sclera: Conjunctivae normal.     Pupils: Pupils are equal, round, and reactive to light.  Cardiovascular:     Rate and Rhythm: Normal rate and regular rhythm.     Heart sounds: No murmur.  Pulmonary:     Effort: Pulmonary effort is normal. No respiratory distress.     Breath sounds: Normal breath sounds.  Abdominal:     Palpations: Abdomen is soft.     Tenderness: There is abdominal tenderness (Mild generalized tenderness). There is no right CVA tenderness, left CVA tenderness or rebound.  Musculoskeletal:     Cervical back: Neck supple.  Skin:    General: Skin is warm and dry.      Capillary Refill: Capillary refill takes less than 2 seconds.     Findings: No rash.  Neurological:     General: No focal deficit present.     Mental Status: He is alert and oriented to person, place, and time.      UC Treatments / Results  Labs (all labs ordered are listed, but only abnormal results are displayed) Labs Reviewed - No data to display  EKG  Radiology No results found.  Procedures Procedures (including critical care time)  Medications Ordered in UC Medications - No data to display  Initial Impression / Assessment and Plan / UC Course  I have reviewed the triage vital signs and the nursing notes.  Pertinent labs & imaging results that were available during my care of the patient were reviewed by me and considered in my medical decision making (see chart for details).     #Diarrhea Patient 69 year old male presenting with 1 day history of diarrhea.  Patient is afebrile and improving symptoms without alarm symptoms of blood, increasing diarrhea or severe abdominal cramping.  Overall believe this will resolve over the next 24 to 48 hours.  Discussed continued p.o. fluid intake and inclusion of electrolyte beverages.  Discussed that patient may consider starting Imodium tomorrow if he continues to have loose stools.  Return precautions of severe abdominal pain, worsening diarrhea or blood in his stool were discussed patient verbalized understanding.   Final Clinical Impressions(s) / UC Diagnoses   Final diagnoses:  Diarrhea, unspecified type     Discharge Instructions     Drink plenty of water, drink pedialyte or gatorade. Avoid reds and orange flavors  You may consider starting immodium tomorrow if you are still generally improving  If you develop severe belly pain, have a fever or have worsening diarrhea or blood in your stool please return to Urgent care or go to the Emergency .     ED Prescriptions    None     PDMP not reviewed this  encounter.   Purnell Shoemaker, PA-C 09/02/19 [phone]

## 2019-09-01 NOTE — Discharge Instructions (Signed)
Drink plenty of water, drink pedialyte or gatorade. Avoid reds and orange flavors  You may consider starting immodium tomorrow if you are still generally improving  If you develop severe belly pain, have a fever or have worsening diarrhea or blood in your stool please return to Urgent care or go to the Emergency .

## 2019-09-01 NOTE — ED Triage Notes (Signed)
  Per patient with diarrhea x last night. Patient not sure if diarrhea was cause by meal he had last night. Pt. Denies fever/vomiting.

## 2019-09-18 ENCOUNTER — Ambulatory Visit: Payer: Medicare Other | Attending: Internal Medicine

## 2019-09-18 DIAGNOSIS — Z23 Encounter for immunization: Secondary | ICD-10-CM

## 2019-09-18 NOTE — Progress Notes (Signed)
   Covid-19 Vaccination Clinic  Name:  [name] [name]    MRN: [id number] DOB: May 04, 1951  09/18/2019  Mr. Capito was observed post Covid-19 immunization for 15 minutes without incident. He was provided with Vaccine Information Sheet and instruction to access the V-Safe system.   Mr. Sahm was instructed to call 911 with any severe reactions post vaccine: Marland Kitchen Difficulty breathing  . Swelling of face and throat  . A fast heartbeat  . A bad rash all over body  . Dizziness and weakness   Immunizations Administered    Name Date Dose VIS Date Route   Pfizer COVID-19 Vaccine 09/18/2019  8:19 AM 0.3 mL 06/01/2019 Intramuscular   Manufacturer: [place]   Lot: [id number]   [place]: [id number]

## 2019-10-26 ENCOUNTER — Other Ambulatory Visit: Payer: Self-pay | Admitting: Family

## 2019-10-31 ENCOUNTER — Other Ambulatory Visit: Payer: Self-pay | Admitting: Family

## 2019-11-02 NOTE — Telephone Encounter (Signed)
New message:    1.Medication Requested: irbesartan (AVAPRO) 75 MG tablet 2. Pharmacy (Name, Street, [place]): [facility] [phone] - [place], [address]. On Med List: yes  4. Last Visit with PCP: 11/24/18  5. Next visit date with PCP: None   Agent: Please be advised that RX refills may take up to 3 business days. We ask that you follow-up with your pharmacy.

## 2019-12-22 ENCOUNTER — Other Ambulatory Visit: Payer: Self-pay | Admitting: Family

## 2019-12-26 ENCOUNTER — Telehealth: Payer: Self-pay | Admitting: Family

## 2019-12-26 NOTE — Telephone Encounter (Signed)
Left VM to notify patient that he is overdue for OV ( last seen 07/2018) and would not be able to give more than #30 tablets until office visit scheduled and patient is seen.

## 2020-01-11 ENCOUNTER — Ambulatory Visit: Payer: Medicare Other | Admitting: Family

## 2020-01-21 ENCOUNTER — Other Ambulatory Visit: Payer: Self-pay | Admitting: Family

## 2020-02-20 ENCOUNTER — Telehealth: Payer: Self-pay | Admitting: Family

## 2020-02-27 ENCOUNTER — Encounter: Payer: Self-pay | Admitting: Family

## 2020-02-27 ENCOUNTER — Other Ambulatory Visit: Payer: Self-pay | Admitting: Family

## 2020-02-27 MED ORDER — IRBESARTAN 75 MG PO TABS: ORAL_TABLET | ORAL | 0 refills | Status: DC

## 2020-02-27 MED ORDER — AMLODIPINE BESYLATE 5 MG PO TABS: ORAL_TABLET | ORAL | 0 refills | Status: DC

## 2020-02-27 NOTE — Telephone Encounter (Signed)
Appointment scheduled for 9/13 Patient requesting short supply of medications

## 2020-02-27 NOTE — Telephone Encounter (Signed)
Called and left message for patient today with info. 

## 2020-02-27 NOTE — Telephone Encounter (Signed)
I gave him #30 of the Amlodipine and Irbesartan; he MUST keep his appointment for next week.

## 2020-02-27 NOTE — Telephone Encounter (Signed)
No note needed 

## 2020-03-03 ENCOUNTER — Telehealth: Payer: Self-pay

## 2020-03-03 ENCOUNTER — Other Ambulatory Visit: Payer: Self-pay

## 2020-03-03 ENCOUNTER — Ambulatory Visit (INDEPENDENT_AMBULATORY_CARE_PROVIDER_SITE_OTHER): Payer: Medicare Other | Admitting: Family

## 2020-03-03 ENCOUNTER — Encounter: Payer: Self-pay | Admitting: Family

## 2020-03-03 VITALS — BP 138/70 | HR 52 | Temp 97.8°F | Ht 73.0 in | Wt 228.8 lb

## 2020-03-03 DIAGNOSIS — I10 Essential (primary) hypertension: Secondary | ICD-10-CM | POA: Diagnosis not present

## 2020-03-03 DIAGNOSIS — Z1211 Encounter for screening for malignant neoplasm of colon: Secondary | ICD-10-CM

## 2020-03-03 DIAGNOSIS — Z23 Encounter for immunization: Secondary | ICD-10-CM

## 2020-03-03 DIAGNOSIS — E7849 Other hyperlipidemia: Secondary | ICD-10-CM | POA: Diagnosis not present

## 2020-03-03 MED ORDER — ATORVASTATIN CALCIUM 20 MG PO TABS: ORAL_TABLET | ORAL | 3 refills | Status: DC

## 2020-03-03 MED ORDER — AMLODIPINE BESYLATE 5 MG PO TABS: ORAL_TABLET | ORAL | 3 refills | Status: DC

## 2020-03-03 MED ORDER — IRBESARTAN 75 MG PO TABS: ORAL_TABLET | ORAL | 3 refills | Status: DC

## 2020-03-03 NOTE — Progress Notes (Signed)
[name] [name] is a 69 y.o. male with the following history as recorded in EpicCare:  Patient Active Problem List   Diagnosis Date Noted  . Prostate cancer ([place]) 01/08/2019  . Hyperglycemia 11/05/2017  . Nondisplaced fracture of medial malleolus of right tibia, initial encounter for closed fracture 02/12/2017  . Tibia fracture 02/11/2017  . Septic arthritis of interphalangeal joint of toe, right ([place]) 05/26/2016  . Essential hypertension 05/25/2016  . Hyperlipidemia 08/02/2015  . Elevated PSA 08/02/2015  . Chronic kidney disease 07/31/2015  . Legally blind in right eye, as defined in [place] 07/10/2015    Current Outpatient Medications  Medication Sig Dispense Refill  . amLODipine (NORVASC) 5 MG tablet TAKE 1 TABLET(5 MG) BY MOUTH DAILY 90 tablet 3  . atorvastatin (LIPITOR) 20 MG tablet TAKE 1 TABLET(20 MG) BY MOUTH DAILY 90 tablet 3  . irbesartan (AVAPRO) 75 MG tablet TAKE 1 TABLET(75 MG) BY MOUTH DAILY 90 tablet 3   No current facility-administered medications for this visit.    Allergies: Doxycycline and Penicillins  Past Medical History:  Diagnosis Date  . Hypercholesteremia   . Hypertension     Past Surgical History:  Procedure Laterality Date  . INCISION AND DRAINAGE OF WOUND Right 05/25/2016   Procedure: IRRIGATION AND DEBRIDEMENT WOUND RIGHT FOOT;  Surgeon: Edrick Kins, DPM;  Location: [facility];  Service: Podiatry;  Laterality: Right;  . LYMPHADENECTOMY Bilateral 01/08/2019   Procedure: LYMPHADENECTOMY, PELVIC BILATERAL;  Surgeon: Raynelle Bring, MD;  [facility];  Service: Urology;  Laterality: Bilateral;  . ROBOT ASSISTED LAPAROSCOPIC RADICAL PROSTATECTOMY N/A 01/08/2019   Procedure: XI ROBOTIC ASSISTED LAPAROSCOPIC RADICAL PROSTATECTOMY LEVEL 2;  Surgeon: Raynelle Bring, MD;  [facility];  Service: Urology;  Laterality: N/A;  . TIBIA IM NAIL INSERTION Right 02/12/2017   Procedure: INTRAMEDULLARY (IM) NAIL TIBIAL, RIGHT;  Surgeon: Leandrew Koyanagi, MD;  Location: [facility];  Service: Orthopedics;  Laterality: Right;    Family History  Problem Relation Age of Onset  . Cancer Mother   . Heart disease Father   . Hypertension Father     Social History   Tobacco Use  . Smoking status: Never Smoker  . Smokeless tobacco: Never Used  Substance Use Topics  . Alcohol use: No    Subjective:   Accompanied by his wife today; follow up on his chronic care needs: hypertension, hyperlipidemia; has been under care of urology for prostate cancer- now being followed every 6 months there;  Denies any chest pain, shortness of breath, blurred vision or headache   Objective:  Vitals:   03/03/20 1008  BP: 138/70  Pulse: (!) 52  Temp: 97.8 F (36.6 C)  TempSrc: Oral  SpO2: 98%  Weight: 228 lb 12.8 oz (103.8 kg)  Height: _0  (1.854 m)    General: Well developed, well nourished, in no acute distress  Head: Normocephalic and atraumatic  Lungs: Respirations unlabored; clear to auscultation bilaterally without wheeze, rales, rhonchi  CVS exam: normal rate and regular rhythm.  Neurologic: Alert and oriented; speech intact; face symmetrical; moves all extremities well; CNII-XII intact without focal deficit   Assessment:  1. Essential hypertension   2. Other hyperlipidemia   3. Flu vaccine need   4. Colon cancer screening     Plan:  1. Stable; refill updated; check CBC, CMP today; 2. Check lipid panel today; refill updated; 3. Flu vaccine given; 4. Cologuard ordered- patient did not want colonoscopy unless he needed to;   This visit occurred during  the SARS-CoV-2 public health emergency.  Safety protocols were in place, including screening questions prior to the visit, additional usage of staff PPE, and extensive cleaning of exam room while observing appropriate contact time as indicated for disinfecting solutions.     No follow-ups on file.  Orders Placed This Encounter  Procedures  . Flu Vaccine QUAD High Dose(Fluad)  . CBC with Differential/Platelet     Standing Status:   Future    Number of Occurrences:   1    Standing Expiration Date:   03/03/2021  . Comp Met (CMET)    Standing Status:   Future    Number of Occurrences:   1    Standing Expiration Date:   03/03/2021  . Lipid panel    Standing Status:   Future    Number of Occurrences:   1    Standing Expiration Date:   03/03/2021  . Cologuard    Requested Prescriptions   Signed Prescriptions Disp Refills  . amLODipine (NORVASC) 5 MG tablet 90 tablet 3    Sig: TAKE 1 TABLET(5 MG) BY MOUTH DAILY  . atorvastatin (LIPITOR) 20 MG tablet 90 tablet 3    Sig: TAKE 1 TABLET(20 MG) BY MOUTH DAILY  . irbesartan (AVAPRO) 75 MG tablet 90 tablet 3    Sig: TAKE 1 TABLET(75 MG) BY MOUTH DAILY

## 2020-03-03 NOTE — Telephone Encounter (Signed)
Cologuard ordered today for patient.

## 2020-03-04 LAB — CBC WITH DIFFERENTIAL/PLATELET
Absolute Monocytes: 477 cells/uL (ref 200–950)
Basophils Absolute: 32 cells/uL (ref 0–200)
Basophils Relative: 0.7 %
Eosinophils Absolute: 41 cells/uL (ref 15–500)
Eosinophils Relative: 0.9 %
HCT: 41.8 % (ref 38.5–50.0)
Hemoglobin: 13.8 g/dL (ref 13.2–17.1)
Lymphs Abs: 1589 cells/uL (ref 850–3900)
MCH: 31 pg (ref 27.0–33.0)
MCHC: 33 g/dL (ref 32.0–36.0)
MCV: 93.9 fL (ref 80.0–100.0)
MPV: 12.5 fL (ref 7.5–12.5)
Monocytes Relative: 10.6 %
Neutro Abs: 2363 cells/uL (ref 1500–7800)
Neutrophils Relative %: 52.5 %
Platelets: 105 10*3/uL — ABNORMAL LOW (ref 140–400)
RBC: 4.45 10*6/uL (ref 4.20–5.80)
RDW: 13.8 % (ref 11.0–15.0)
Total Lymphocyte: 35.3 %
WBC: 4.5 10*3/uL (ref 3.8–10.8)

## 2020-03-04 LAB — LIPID PANEL
Cholesterol: 146 mg/dL (ref <200)
HDL: 35 mg/dL — ABNORMAL LOW (ref >=40)
LDL Cholesterol (Calc): 86 mg/dL (calc)
Non-HDL Cholesterol (Calc): 111 mg/dL (calc) (ref <130)
Total CHOL/HDL Ratio: 4.2 (calc) (ref <5.0)
Triglycerides: 149 mg/dL (ref <150)

## 2020-03-04 LAB — COMPREHENSIVE METABOLIC PANEL
AG Ratio: 1.7 (calc) (ref 1.0–2.5)
ALT: 23 U/L (ref 9–46)
AST: 25 U/L (ref 10–35)
Albumin: 4.2 g/dL (ref 3.6–5.1)
Alkaline phosphatase (APISO): 68 U/L (ref 35–144)
BUN/Creatinine Ratio: 14 (calc) (ref 6–22)
BUN: 21 mg/dL (ref 7–25)
CO2: 30 mmol/L (ref 20–32)
Calcium: 9.3 mg/dL (ref 8.6–10.3)
Chloride: 106 mmol/L (ref 98–110)
Creat: 1.5 mg/dL — ABNORMAL HIGH (ref 0.70–1.25)
Globulin: 2.5 g/dL (calc) (ref 1.9–3.7)
Glucose, Bld: 79 mg/dL (ref 65–99)
Potassium: 3.8 mmol/L (ref 3.5–5.3)
Sodium: 141 mmol/L (ref 135–146)
Total Bilirubin: 0.8 mg/dL (ref 0.2–1.2)
Total Protein: 6.7 g/dL (ref 6.1–8.1)

## 2020-05-26 ENCOUNTER — Telehealth: Payer: Self-pay | Admitting: Family

## 2020-05-26 NOTE — Telephone Encounter (Signed)
Left patient a voicemail to give the office a call back. This was in regards to cologuard.

## 2020-05-26 NOTE — Telephone Encounter (Signed)
Patient wife says they've receive the cologuard just haven't done it yet. I advise them to please do so asap.

## 2020-05-26 NOTE — Telephone Encounter (Signed)
Please remind him he needs to complete his Cologuard- the colon cancer screen we discussed at his last OV. If he has not received the kit, please let us know and we can re-order.

## 2021-01-26 ENCOUNTER — Encounter (HOSPITAL_COMMUNITY): Payer: Self-pay | Admitting: Emergency Medicine

## 2021-01-26 ENCOUNTER — Ambulatory Visit (HOSPITAL_COMMUNITY)
Admission: EM | Admit: 2021-01-26 | Discharge: 2021-01-26 | Disposition: A | Discharge status: Home / Self Care | Payer: Medicare Other | Attending: Family Medicine | Admitting: Family Medicine

## 2021-01-26 ENCOUNTER — Other Ambulatory Visit: Payer: Self-pay

## 2021-01-26 DIAGNOSIS — J069 Acute upper respiratory infection, unspecified: Secondary | ICD-10-CM | POA: Diagnosis not present

## 2021-01-26 DIAGNOSIS — Z79899 Other long term (current) drug therapy: Secondary | ICD-10-CM | POA: Diagnosis not present

## 2021-01-26 DIAGNOSIS — Z881 Allergy status to other antibiotic agents status: Secondary | ICD-10-CM | POA: Diagnosis not present

## 2021-01-26 DIAGNOSIS — Z880 Allergy status to penicillin: Secondary | ICD-10-CM | POA: Diagnosis not present

## 2021-01-26 DIAGNOSIS — U071 COVID-19: Secondary | ICD-10-CM | POA: Diagnosis not present

## 2021-01-26 DIAGNOSIS — R051 Acute cough: Secondary | ICD-10-CM | POA: Diagnosis present

## 2021-01-26 LAB — SARS CORONAVIRUS 2 (TAT 6-24 HRS): SARS Coronavirus 2: POSITIVE — AB

## 2021-01-26 MED ORDER — PROMETHAZINE-DM 6.25-15 MG/5ML PO SYRP: 5.0000 mL | ORAL_SOLUTION | Freq: Four times a day (QID) | ORAL | 0 refills | Status: DC | PRN

## 2021-01-26 NOTE — ED Triage Notes (Signed)
Patient complains of coughing, feeling cold for 2 days.  Complains of runny nose.  Patient has been taking advil at home, minimal relief.

## 2021-01-26 NOTE — ED Provider Notes (Signed)
[facility]    CSN: [id number] Arrival date & time: 01/26/21  1242      History   Chief Complaint Chief Complaint  Patient presents with   Cough    HPI [name] [name] is a 70 y.o. male.   Patient presenting today with 2-day history of fever, chills, cough, runny nose.  Denies chest pain, shortness of breath, dizziness, weakness, abdominal pain, nausea vomiting or diarrhea.  Took some Advil last night with mild temporary relief.  No known history of pulmonary disease.  Wife sick with similar symptoms.   Past Medical History:  Diagnosis Date   Hypercholesteremia    Hypertension     Patient Active Problem List   Diagnosis Date Noted   Prostate cancer ([facility]) 01/08/2019   Hyperglycemia 11/05/2017   Nondisplaced fracture of medial malleolus of right tibia, initial encounter for closed fracture 02/12/2017   Tibia fracture 02/11/2017   Septic arthritis of interphalangeal joint of toe, right ([place]) 05/26/2016   Essential hypertension 05/25/2016   Hyperlipidemia 08/02/2015   Elevated PSA 08/02/2015   Chronic kidney disease 07/31/2015   Legally blind in right eye, as defined in [place] 07/10/2015    Past Surgical History:  Procedure Laterality Date   INCISION AND DRAINAGE OF WOUND Right 05/25/2016   Procedure: IRRIGATION AND DEBRIDEMENT WOUND RIGHT FOOT;  Surgeon: Edrick Kins, DPM;  Location: [facility];  Service: Podiatry;  Laterality: Right;   LYMPHADENECTOMY Bilateral 01/08/2019   Procedure: LYMPHADENECTOMY, PELVIC BILATERAL;  Surgeon: Raynelle Bring, MD;  [facility];  Service: Urology;  Laterality: Bilateral;   ROBOT ASSISTED LAPAROSCOPIC RADICAL PROSTATECTOMY N/A 01/08/2019   Procedure: XI ROBOTIC ASSISTED LAPAROSCOPIC RADICAL PROSTATECTOMY LEVEL 2;  Surgeon: Raynelle Bring, MD;  [facility];  Service: Urology;  Laterality: N/A;   TIBIA IM NAIL INSERTION Right 02/12/2017   Procedure: INTRAMEDULLARY (IM) NAIL TIBIAL, RIGHT;  Surgeon: Leandrew Koyanagi, MD;  Location:  [facility];  Service: Orthopedics;  Laterality: Right;       Home Medications    Prior to Admission medications   Medication Sig Start Date End Date Taking? Authorizing Provider  amLODipine (NORVASC) 5 MG tablet TAKE 1 TABLET(5 MG) BY MOUTH DAILY 03/03/20  Yes Marrian Salvage, FNP  atorvastatin (LIPITOR) 20 MG tablet TAKE 1 TABLET(20 MG) BY MOUTH DAILY 03/03/20  Yes Marrian Salvage, FNP  ibuprofen (ADVIL) 200 MG tablet Take 200 mg by mouth every 6 (six) hours as needed.   Yes [provider]  irbesartan (AVAPRO) 75 MG tablet TAKE 1 TABLET(75 MG) BY MOUTH DAILY 03/03/20  Yes Marrian Salvage, FNP  promethazine-dextromethorphan (PROMETHAZINE-DM) 6.25-15 MG/5ML syrup Take 5 mLs by mouth 4 (four) times daily as needed for cough. 01/26/21  Yes Volney American, PA-C    Family History Family History  Problem Relation Age of Onset   Cancer Mother    Heart disease Father    Hypertension Father     Social History Social History   Tobacco Use   Smoking status: Never   Smokeless tobacco: Never  Vaping Use   Vaping Use: Never used  Substance Use Topics   Alcohol use: No   Drug use: No     Allergies   Doxycycline and Penicillins   Review of Systems Review of Systems Per HPI  Physical Exam Triage Vital Signs ED Triage Vitals  Enc Vitals Group     BP 01/26/21 1517 (!) 147/80     Pulse Rate 01/26/21 1517 (!) 52  Resp 01/26/21 1517 20     Temp 01/26/21 1517 98.5 F (36.9 C)     Temp Source 01/26/21 1517 Oral     SpO2 01/26/21 1517 100 %     Weight --      Height --      Head Circumference --      Peak Flow --      Pain Score 01/26/21 1514 10     Pain Loc --      Pain Edu? --      Excl. in [place]? --    No data found.  Updated Vital Signs BP (!) 147/80 (BP Location: Right Arm)   Pulse (!) 52   Temp 98.5 F (36.9 C) (Oral)   Resp 20   SpO2 100%   Visual Acuity Right Eye Distance:   Left Eye Distance:   Bilateral Distance:    Right  Eye Near:   Left Eye Near:    Bilateral Near:     Physical Exam Vitals and nursing note reviewed.  Constitutional:      Appearance: Normal appearance.  HENT:     Head: Atraumatic.     Right Ear: Tympanic membrane normal.     Left Ear: Tympanic membrane normal.     Nose: Rhinorrhea present.     Mouth/Throat:     Pharynx: Posterior oropharyngeal erythema present.  Eyes:     Extraocular Movements: Extraocular movements intact.     Conjunctiva/sclera: Conjunctivae normal.  Cardiovascular:     Rate and Rhythm: Normal rate and regular rhythm.  Pulmonary:     Effort: Pulmonary effort is normal. No respiratory distress.     Breath sounds: Normal breath sounds. No wheezing or rales.  Musculoskeletal:        General: Normal range of motion.     Cervical back: Normal range of motion and neck supple.  Skin:    General: Skin is warm and dry.  Neurological:     General: No focal deficit present.     Mental Status: He is oriented to person, place, and time.     Motor: No weakness.     Gait: Gait normal.  Psychiatric:        Mood and Affect: Mood normal.        Thought Content: Thought content normal.        Judgment: Judgment normal.   UC Treatments / Results  Labs (all labs ordered are listed, but only abnormal results are displayed) Labs Reviewed  SARS CORONAVIRUS 2 (TAT 6-24 HRS)    EKG   Radiology No results found.  Procedures Procedures (including critical care time)  Medications Ordered in UC Medications - No data to display  Initial Impression / Assessment and Plan / UC Course  I have reviewed the triage vital signs and the nursing notes.  Pertinent labs & imaging results that were available during my care of the patient were reviewed by me and considered in my medical decision making (see chart for details).     Patient very well-appearing today, no acute distress.  Suspect viral illness, possibly COVID.  COVID PCR pending, treat with Phenergan DM and  over-the-counter supportive medications and home care.  Quarantine reviewed, note given.  Return for acutely worsening symptoms.  Final Clinical Impressions(s) / UC Diagnoses   Final diagnoses:  Viral URI with cough   Discharge Instructions   None    ED Prescriptions     Medication Sig Dispense Auth. Provider   promethazine-dextromethorphan (PROMETHAZINE-DM)  6.25-15 MG/5ML syrup Take 5 mLs by mouth 4 (four) times daily as needed for cough. 100 mL Volney American, [place]      PDMP not reviewed this encounter.   Volney American, [place] 01/26/21 1623

## 2021-01-27 ENCOUNTER — Telehealth: Payer: Self-pay | Admitting: *Deleted

## 2021-01-27 NOTE — Telephone Encounter (Signed)
Who Is Calling Patient / Member / Family / Caregiver Call Type Triage / Clinical Caller Name [name] [name] Relationship To Patient Spouse Return Phone Number [phone] (Primary) Chief Complaint Fever (non-urgent symptom) (greater than THREE MONTHS old) Reason for Call Symptomatic / Request for [place] is calling for husband, went to UC, since then has had a fever of 100.9. He was really cold. He is also coughing. At [facility] he was prescribed cough syrup. Translation No Nurse Assessment Nurse: Quincy Simmonds, RN, Annita Brod Date/Time Eilene Ghazi Time): 01/26/2021 9:45:08 PM Confirm and document reason for call. If symptomatic, describe symptoms. ---Caller is calling for husband, went to UC, since then has had a fever of 100.9. He was really cold. He is also coughing. At [facility] he was prescribed cough syrup. was swabbed and told to call back tom for results.   01/26/2021 9:52:17 [place], RN, Annita Brod Caller Disagree/Comply Comply Caller Understands Yes PreDisposition Call Doctor Care Advice Given Per Guideline HOME CARE: * You should be able to treat this at home. FEVER MEDICINES: * For fevers above 101 F (38.3 C) take either acetaminophen or ibuprofen. * You become worse * Fever over 104 F (40 C), or fever lasts over 3 days * Nasal discharge lasts over 10 days CALL BACK IF: CARE ADVICE given per Cough - Acute Non-Productive (Adult) guideline

## 2021-02-08 ENCOUNTER — Other Ambulatory Visit: Payer: Self-pay

## 2021-02-08 ENCOUNTER — Ambulatory Visit (HOSPITAL_COMMUNITY)
Admission: EM | Admit: 2021-02-08 | Discharge: 2021-02-08 | Disposition: A | Discharge status: Home / Self Care | Payer: Medicare Other | Attending: Emergency Medicine | Admitting: Emergency Medicine

## 2021-02-08 ENCOUNTER — Encounter (HOSPITAL_COMMUNITY): Payer: Self-pay

## 2021-02-08 DIAGNOSIS — M545 Low back pain, unspecified: Secondary | ICD-10-CM

## 2021-02-08 MED ORDER — NAPROXEN 500 MG PO TABS: 500.0000 mg | ORAL_TABLET | Freq: Two times a day (BID) | ORAL | 0 refills | Status: DC

## 2021-02-08 MED ORDER — KETOROLAC TROMETHAMINE 30 MG/ML IJ SOLN
INTRAMUSCULAR | Status: AC
  Filled 2021-02-08: qty 1

## 2021-02-08 MED ORDER — KETOROLAC TROMETHAMINE 30 MG/ML IJ SOLN
30.0000 mg | Freq: Once | INTRAMUSCULAR | Status: AC
  Administered 2021-02-08: 30 mg via INTRAMUSCULAR

## 2021-02-08 MED ORDER — CYCLOBENZAPRINE HCL 10 MG PO TABS: 10.0000 mg | ORAL_TABLET | Freq: Every day | ORAL | 0 refills | Status: DC
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
## 2021-02-08 NOTE — Discharge Instructions (Addendum)
Your symptoms are most likely muscular and should improve over time  Take naproxen twice a day for the next 7 days then may use as needed  Can use muscle relaxer at bedtime as needed for additional comfort, be mindful this medication may make you drowsy, if this occurs you may take half the dose and see how you do  If pain persist longer than 1 to 2 weeks please follow-up in urgent care for reevaluation
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
## 2021-03-04 ENCOUNTER — Other Ambulatory Visit: Payer: Self-pay | Admitting: Family

## 2021-03-04 NOTE — Telephone Encounter (Signed)
Spoke with patients wife and they are going to stay at [facility] since it is closer to their home.  Refills sent in until they get seen over there.

## 2021-03-27 ENCOUNTER — Encounter: Payer: Self-pay | Admitting: Nurse Practitioner

## 2021-03-27 ENCOUNTER — Ambulatory Visit (INDEPENDENT_AMBULATORY_CARE_PROVIDER_SITE_OTHER): Payer: Medicare Other | Admitting: Nurse Practitioner

## 2021-03-27 VITALS — BP 136/88 | HR 62 | Temp 98.0°F | Ht 73.0 in | Wt 228.0 lb

## 2021-03-27 DIAGNOSIS — Z131 Encounter for screening for diabetes mellitus: Secondary | ICD-10-CM

## 2021-03-27 DIAGNOSIS — N189 Chronic kidney disease, unspecified: Secondary | ICD-10-CM

## 2021-03-27 DIAGNOSIS — I10 Essential (primary) hypertension: Secondary | ICD-10-CM | POA: Diagnosis not present

## 2021-03-27 DIAGNOSIS — E669 Obesity, unspecified: Secondary | ICD-10-CM | POA: Diagnosis not present

## 2021-03-27 DIAGNOSIS — E7849 Other hyperlipidemia: Secondary | ICD-10-CM

## 2021-03-27 DIAGNOSIS — Z23 Encounter for immunization: Secondary | ICD-10-CM

## 2021-03-27 DIAGNOSIS — Z6830 Body mass index (BMI) 30.0-30.9, adult: Secondary | ICD-10-CM

## 2021-03-27 LAB — CBC WITH DIFFERENTIAL/PLATELET
Basophils Absolute: 0 10*3/uL (ref 0.0–0.1)
Basophils Relative: 0.3 % (ref 0.0–3.0)
Eosinophils Absolute: 0 10*3/uL (ref 0.0–0.7)
Eosinophils Relative: 0.8 % (ref 0.0–5.0)
HCT: 40 % (ref 39.0–52.0)
Hemoglobin: 13.2 g/dL (ref 13.0–17.0)
Lymphocytes Relative: 25.4 % (ref 12.0–46.0)
Lymphs Abs: 1.3 10*3/uL (ref 0.7–4.0)
MCHC: 33.2 g/dL (ref 30.0–36.0)
MCV: 92.7 fl (ref 78.0–100.0)
Monocytes Absolute: 0.5 10*3/uL (ref 0.1–1.0)
Monocytes Relative: 9.1 % (ref 3.0–12.0)
Neutro Abs: 3.4 10*3/uL (ref 1.4–7.7)
Neutrophils Relative %: 64.4 % (ref 43.0–77.0)
Platelets: 116 10*3/uL — ABNORMAL LOW (ref 150.0–400.0)
RBC: 4.31 Mil/uL (ref 4.22–5.81)
RDW: 14.3 % (ref 11.5–15.5)
WBC: 5.2 10*3/uL (ref 4.0–10.5)

## 2021-03-27 LAB — LIPID PANEL
Cholesterol: 135 mg/dL (ref 0–200)
HDL: 36.3 mg/dL — ABNORMAL LOW (ref >39.00)
LDL Cholesterol: 82 mg/dL (ref 0–99)
NonHDL: 99.12
Total CHOL/HDL Ratio: 4
Triglycerides: 87 mg/dL (ref 0.0–149.0)
VLDL: 17.4 mg/dL (ref 0.0–40.0)

## 2021-03-27 LAB — COMPREHENSIVE METABOLIC PANEL
ALT: 18 U/L (ref 0–53)
AST: 26 U/L (ref 0–37)
Albumin: 4.2 g/dL (ref 3.5–5.2)
Alkaline Phosphatase: 68 U/L (ref 39–117)
BUN: 23 mg/dL (ref 6–23)
CO2: 30 mEq/L (ref 19–32)
Calcium: 9.1 mg/dL (ref 8.4–10.5)
Chloride: 105 mEq/L (ref 96–112)
Creatinine, Ser: 1.34 mg/dL (ref 0.40–1.50)
GFR: 54.71 mL/min — ABNORMAL LOW (ref >60.00)
Glucose, Bld: 87 mg/dL (ref 70–99)
Potassium: 3.6 mEq/L (ref 3.5–5.1)
Sodium: 141 mEq/L (ref 135–145)
Total Bilirubin: 0.7 mg/dL (ref 0.2–1.2)
Total Protein: 7.4 g/dL (ref 6.0–8.3)

## 2021-03-27 LAB — TSH: TSH: 3.72 u[IU]/mL (ref 0.35–5.50)

## 2021-03-27 LAB — HEMOGLOBIN A1C: Hgb A1c MFr Bld: 5.8 % (ref 4.6–6.5)

## 2021-03-27 NOTE — Progress Notes (Signed)
Subjective:  Patient ID: [name] [name], male    DOB: 29-Dec-1950  Age: 70 y.o. MRN: [id number]  CC:  Chief Complaint  Patient presents with   transfer of care       HPI  This patient arrives today for the above.  He has no acute complaints today.  He was seeing [name], FNP but is now transferring to me as his primary care provider.  He does not have his medications with him today and is a bit confused on what he is taking but is able to verify that he is on amlodipine and atorvastatin.  He is due for blood work today.  Past Medical History:  Diagnosis Date   Hypercholesteremia    Hypertension       Family History  Problem Relation Age of Onset   Cancer Mother    Heart disease Father    Hypertension Father     Social History   Social History Narrative   Not on file   Social History   Tobacco Use   Smoking status: Never   Smokeless tobacco: Never  Substance Use Topics   Alcohol use: No     Current Meds  Medication Sig   amLODipine (NORVASC) 5 MG tablet TAKE 1 TABLET(5 MG) BY MOUTH DAILY   atorvastatin (LIPITOR) 20 MG tablet TAKE 1 TABLET(20 MG) BY MOUTH DAILY   ibuprofen (ADVIL) 200 MG tablet Take 200 mg by mouth every 6 (six) hours as needed.   naproxen (NAPROSYN) 500 MG tablet Take 1 tablet (500 mg total) by mouth 2 (two) times daily.   promethazine-dextromethorphan (PROMETHAZINE-DM) 6.25-15 MG/5ML syrup Take 5 mLs by mouth 4 (four) times daily as needed for cough.   [DISCONTINUED] cyclobenzaprine (FLEXERIL) 10 MG tablet Take 1 tablet (10 mg total) by mouth at bedtime.   [DISCONTINUED] irbesartan (AVAPRO) 75 MG tablet TAKE 1 TABLET(75 MG) BY MOUTH DAILY    ROS:  Review of Systems  Constitutional:  Negative for fever, malaise/fatigue and weight loss.  Respiratory:  Negative for shortness of breath.   Cardiovascular:  Negative for chest pain.  Neurological:  Negative for dizziness and headaches.    Objective:   Today's Vitals: BP 136/88  (BP Location: Right Arm, Patient Position: Sitting, Cuff Size: Large)   Pulse 62   Temp 98 F (36.7 C) (Oral)   Ht 6\' 1"  (1.854 m)   Wt 228 lb (103.4 kg)   SpO2 97%   BMI 30.08 kg/m  Vitals with BMI 03/27/2021 02/08/2021 01/26/2021  Height 6\' 1"  - -  Weight 228 lbs - -  BMI 94.17 - -  Systolic 408 144 818  Diastolic 88 79 80  Pulse 62 57 52     Physical Exam Vitals reviewed.  Constitutional:      Appearance: Normal appearance.  HENT:     Head: Normocephalic and atraumatic.  Neck:     Vascular: No carotid bruit.  Cardiovascular:     Rate and Rhythm: Normal rate and regular rhythm.  Pulmonary:     Effort: Pulmonary effort is normal.     Breath sounds: Normal breath sounds.  Musculoskeletal:     Cervical back: Neck supple.  Skin:    General: Skin is warm and dry.  Neurological:     Mental Status: He is alert and oriented to person, place, and time.  Psychiatric:        Mood and Affect: Mood normal.        Behavior: Behavior normal.  Thought Content: Thought content normal.        Judgment: Judgment normal.         Assessment and Plan   1. Flu vaccine need   2. Essential hypertension   3. Chronic kidney disease, unspecified CKD stage   4. Other hyperlipidemia   5. Class 1 obesity with serious comorbidity and body mass index (BMI) of 30.0 to 30.9 in adult, unspecified obesity type   6. Screening for diabetes mellitus      Plan: 1.  Flu vaccine administered today. 2.-6. He will continue on his medications as currently prescribed.  We will check blood work today. He was encouraged to bring his medications with him at his next follow-up to ensure we have everything in the system correctly.  He tells me he understands.   Tests ordered Orders Placed This Encounter  Procedures   Flu Vaccine QUAD High Dose(Fluad)   TSH   Hemoglobin A1c   Lipid panel   Comprehensive metabolic panel   CBC with Differential/Platelet      No orders of the defined types  were placed in this encounter.   Patient to follow-up in 3 months for comprehensive exam and to discuss health maintenance, or sooner as needed.  Ailene Ards, NP

## 2021-03-27 NOTE — Addendum Note (Signed)
Addended by: Boris Lown B on: 03/27/2021 08:34 AM   Modules accepted: Orders

## 2021-06-02 ENCOUNTER — Other Ambulatory Visit: Payer: Self-pay | Admitting: Family

## 2021-06-08 ENCOUNTER — Telehealth: Payer: Self-pay | Admitting: Nurse Practitioner

## 2021-06-08 NOTE — Telephone Encounter (Signed)
1.Medication Requested: atorvastatin (LIPITOR) 20 MG tablet amLODipine (NORVASC) 5 MG tablet Irbesartan 75 M *did not see on med list*   2. Pharmacy (Name, Street, [place]):[facility] [phone] - [place], [address] AT [address]  3. On Med List: yes (2)  4. Last Visit with PCP: 03-27-2021  5. Next visit date with PCP: 07-02-2021   Agent: Please be advised that RX refills may take up to 3 business days. We ask that you follow-up with your pharmacy.

## 2021-06-09 ENCOUNTER — Other Ambulatory Visit: Payer: Self-pay

## 2021-06-09 DIAGNOSIS — E7849 Other hyperlipidemia: Secondary | ICD-10-CM

## 2021-06-09 DIAGNOSIS — I10 Essential (primary) hypertension: Secondary | ICD-10-CM

## 2021-06-09 DIAGNOSIS — R972 Elevated prostate specific antigen [PSA]: Secondary | ICD-10-CM

## 2021-06-09 MED ORDER — ATORVASTATIN CALCIUM 20 MG PO TABS: ORAL_TABLET | ORAL | 0 refills | Status: DC

## 2021-06-09 MED ORDER — AMLODIPINE BESYLATE 5 MG PO TABS
ORAL_TABLET | ORAL | 0 refills | Status: DC
Start: 2021-06-09 — End: 2021-09-08

## 2021-06-09 NOTE — Telephone Encounter (Signed)
Patient calling to check status of refill  Informed patient refill request may take up to 3 bds

## 2021-06-10 MED ORDER — IRBESARTAN 75 MG PO TABS: 75.0000 mg | ORAL_TABLET | Freq: Every day | ORAL | 0 refills | Status: DC

## 2021-06-10 NOTE — Telephone Encounter (Signed)
Can we call pharmacy and verify if he is taking irbesartan and fill pattern? It appears he was unsure of medication at recent visit with Judson Roch. If he is filling irbesartan 75 mg daily per pharmacy okay for #90 no refills. If not then please do not fill.

## 2021-06-10 NOTE — Telephone Encounter (Signed)
Spoke with the pharmacy and he picked up a 90 day supply on 03/06/2021

## 2021-06-10 NOTE — Telephone Encounter (Signed)
Patient requesting a call back to discuss why irbesartan (AVAPRO) 75 MG tablet [[id number] was not filled  Phone [phone]

## 2021-07-02 ENCOUNTER — Ambulatory Visit: Payer: Medicare Other | Admitting: Nurse Practitioner

## 2021-08-31 ENCOUNTER — Other Ambulatory Visit: Payer: Self-pay | Admitting: Nurse Practitioner

## 2021-08-31 ENCOUNTER — Other Ambulatory Visit: Payer: Self-pay | Admitting: Internal Medicine

## 2021-08-31 DIAGNOSIS — I10 Essential (primary) hypertension: Secondary | ICD-10-CM

## 2021-08-31 DIAGNOSIS — E7849 Other hyperlipidemia: Secondary | ICD-10-CM

## 2021-09-08 NOTE — Telephone Encounter (Signed)
Patient spouse calling in to check status of refill request submitted 08/31/21 ? ?Says patient is completely out of meds & need them sent to pharmacy asap ? ? ?

## 2021-10-06 ENCOUNTER — Other Ambulatory Visit: Payer: Self-pay | Admitting: Nurse Practitioner

## 2021-10-06 DIAGNOSIS — E7849 Other hyperlipidemia: Secondary | ICD-10-CM

## 2021-10-08 ENCOUNTER — Telehealth: Payer: Self-pay

## 2021-10-08 NOTE — Telephone Encounter (Signed)
Pt checking status of refill request, pt states he is out of medication ? ?Can you please assist in Sarah's absence  ?

## 2021-10-08 NOTE — Telephone Encounter (Signed)
Spoke with wife today. She has been informed we would send in one last refill and he will not be able to receive any more refills without return office visit. 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 I did explain to her again that if he did not make an appointment for follow up he may be denied script for refill. She questions why he has to be seen every 3 months and it was explained to her that providers determine when patients need to be seen based on medical history and maintenance and that patient's last office visit was October of last year. Wanted this documented that patient has been told to make appointment ?

## 2021-10-15 ENCOUNTER — Other Ambulatory Visit: Payer: Self-pay | Admitting: Nurse Practitioner

## 2021-10-15 DIAGNOSIS — I10 Essential (primary) hypertension: Secondary | ICD-10-CM

## 2021-10-20 ENCOUNTER — Telehealth: Payer: Self-pay | Admitting: Nurse Practitioner

## 2021-10-20 DIAGNOSIS — I10 Essential (primary) hypertension: Secondary | ICD-10-CM

## 2021-10-20 MED ORDER — AMLODIPINE BESYLATE 5 MG PO TABS: ORAL_TABLET | ORAL | 0 refills | Status: DC

## 2021-10-20 NOTE — Telephone Encounter (Signed)
Pt made f/u appt 10/30/21. Sent 30 day supply until appt.Marland KitchenTresa Garter ?

## 2021-10-20 NOTE — Telephone Encounter (Signed)
1.Medication Requested: ?amLODipine (NORVASC) 5 MG tablet ?2. Pharmacy (Name, Street, [place]): ?[facility] [phone] - [place], [place] - [address] Phone:  [phone]  ?Fax:  [phone]  ?  ? ?3. On Med List: yes ? ?4. Last Visit with PCP: ? ?5. Next visit date with PCP: ? ?Pt spouse states he is completely out of the medication. Please send refill ASAP.  ? ?Agent: Please be advised that RX refills may take up to 3 business days. We ask that you follow-up with your pharmacy.  ?

## 2021-10-29 ENCOUNTER — Ambulatory Visit: Payer: Medicare Other | Admitting: Nurse Practitioner

## 2021-10-30 ENCOUNTER — Ambulatory Visit: Payer: Medicare Other | Admitting: Nurse Practitioner

## 2021-11-12 ENCOUNTER — Encounter: Payer: Self-pay | Admitting: Nurse Practitioner

## 2021-11-12 ENCOUNTER — Ambulatory Visit (INDEPENDENT_AMBULATORY_CARE_PROVIDER_SITE_OTHER): Payer: Medicare Other | Admitting: Nurse Practitioner

## 2021-11-12 VITALS — BP 118/74 | HR 53 | Ht 73.0 in | Wt 232.0 lb

## 2021-11-12 DIAGNOSIS — D696 Thrombocytopenia, unspecified: Secondary | ICD-10-CM | POA: Diagnosis not present

## 2021-11-12 DIAGNOSIS — E7849 Other hyperlipidemia: Secondary | ICD-10-CM | POA: Diagnosis not present

## 2021-11-12 DIAGNOSIS — I10 Essential (primary) hypertension: Secondary | ICD-10-CM

## 2021-11-12 DIAGNOSIS — N1831 Chronic kidney disease, stage 3a: Secondary | ICD-10-CM

## 2021-11-12 MED ORDER — IRBESARTAN 75 MG PO TABS: 75.0000 mg | ORAL_TABLET | Freq: Every day | ORAL | 2 refills | Status: DC

## 2021-11-12 MED ORDER — ATORVASTATIN CALCIUM 20 MG PO TABS: ORAL_TABLET | ORAL | 2 refills | Status: DC

## 2021-11-12 MED ORDER — AMLODIPINE BESYLATE 5 MG PO TABS: ORAL_TABLET | ORAL | 2 refills | Status: DC

## 2021-11-12 NOTE — Progress Notes (Signed)
Established Patient Office Visit  Subjective   Patient ID: [name] [name], male    DOB: 08-27-1950  Age: 71 y.o. MRN: [id number]  Chief Complaint  Patient presents with   Follow-up   Prediabetes   Patient arrives today for the above.  He was seen approximately 7 months ago.  At that time he was recommended to follow-up in 3 months.  Blood work was collected at initial office visit, and he is here today to discuss these results.  Results showed prediabetes with A1c of 5.8, chronic kidney disease stage IIIa.  He also has thrombocytopenia which has been present since at least 2015.  He also has chronic hypertension and hyperlipidemia.  He continues on his chronic medications including ARB and statin.  Prediabetes: Blood work collected at last office visit showed A1c of 5.8.  He reports very little intake of sugary beverages and reports avoiding bread.  Chronic kidney disease stage IIIa: Per chart review GFR is generally in the 50s.  Last EGFR was 54.71 with creatinine of 1.34.  He is on ARB.  He will be due today to have urine checked for albuminuria.  Naproxen is listed in his medications that he takes as needed, however he reports not taking this very often.  Thrombocytopenia: Per chart review has been chronic since 2015.  Other aspects of CBC are all within normal limits.  He denies ever being worked up for this.  Hypertension: Continues to take and tolerates his chronic antihypertensives including amlodipine 5 mg/day and irbesartan 75 mg/day.  Hyperlipidemia: Last LDL was 82.  He continues on atorvastatin 20 mg/day.  Past Medical History:  Diagnosis Date   Hypercholesteremia    Hypertension       Review of Systems  Respiratory:  Negative for shortness of breath.   Cardiovascular:  Negative for chest pain and palpitations.  Neurological:  Negative for headaches.     Objective:     BP 118/74 (BP Location: Left Arm, Patient Position: Sitting, Cuff Size: Large)   Pulse (!) 53    Ht 6\' 1"  (1.854 m)   Wt 232 lb (105.2 kg)   SpO2 97%   BMI 30.61 kg/m  BP Readings from Last 3 Encounters:  11/12/21 118/74  03/27/21 136/88  02/08/21 (!) 146/79   Wt Readings from Last 3 Encounters:  11/12/21 232 lb (105.2 kg)  03/27/21 228 lb (103.4 kg)  03/03/20 228 lb 12.8 oz (103.8 kg)      Physical Exam Vitals reviewed.  Constitutional:      Appearance: Normal appearance.  HENT:     Head: Normocephalic and atraumatic.  Cardiovascular:     Rate and Rhythm: Normal rate and regular rhythm.  Pulmonary:     Effort: Pulmonary effort is normal.     Breath sounds: Normal breath sounds.  Musculoskeletal:     Cervical back: Neck supple.  Skin:    General: Skin is warm and dry.  Neurological:     Mental Status: He is alert and oriented to person, place, and time.  Psychiatric:        Mood and Affect: Mood normal.        Behavior: Behavior normal.        Thought Content: Thought content normal.        Judgment: Judgment normal.     No results found for any visits on 11/12/21.  Last CBC Lab Results  Component Value Date   WBC 5.2 03/27/2021   HGB 13.2 03/27/2021   HCT  40.0 03/27/2021   MCV 92.7 03/27/2021   MCH 31.0 03/03/2020   RDW 14.3 03/27/2021   PLT 116.0 (L) 70/96/2836   Last metabolic panel Lab Results  Component Value Date   GLUCOSE 87 03/27/2021   NA 141 03/27/2021   K 3.6 03/27/2021   CL 105 03/27/2021   CO2 30 03/27/2021   BUN 23 03/27/2021   CREATININE 1.34 03/27/2021   GFRNONAA 53 (L) 01/05/2019   CALCIUM 9.1 03/27/2021   PROT 7.4 03/27/2021   ALBUMIN 4.2 03/27/2021   BILITOT 0.7 03/27/2021   ALKPHOS 68 03/27/2021   AST 26 03/27/2021   ALT 18 03/27/2021   ANIONGAP 6 01/05/2019   Last lipids Lab Results  Component Value Date   CHOL 135 03/27/2021   HDL 36.30 (L) 03/27/2021   LDLCALC 82 03/27/2021   LDLDIRECT 157.0 11/07/2017   TRIG 87.0 03/27/2021   CHOLHDL 4 03/27/2021   Last hemoglobin A1c Lab Results  Component Value  Date   HGBA1C 5.8 03/27/2021      The 10-year ASCVD risk score (Arnett DK, et al., 2019) is: 15.2%    Assessment & Plan:   Problem List Items Addressed This Visit       Cardiovascular and Mediastinum   Essential hypertension   Relevant Medications   atorvastatin (LIPITOR) 20 MG tablet   amLODipine (NORVASC) 5 MG tablet   irbesartan (AVAPRO) 75 MG tablet     Genitourinary   Chronic kidney disease - Primary   Relevant Orders   Ambulatory referral to Nephrology   Microalbumin / creatinine urine ratio     Other   Hyperlipidemia   Relevant Medications   atorvastatin (LIPITOR) 20 MG tablet   amLODipine (NORVASC) 5 MG tablet   irbesartan (AVAPRO) 75 MG tablet   Other Visit Diagnoses     Thrombocytopenia ([place])       Relevant Orders   Ambulatory referral to Hematology / Oncology       Return in about 6 months (around 05/15/2022) for [place] .    Ailene Ards, NP

## 2021-11-12 NOTE — Assessment & Plan Note (Signed)
Chronic, stable.  Continue on atorvastatin 20 mg/day.

## 2021-11-12 NOTE — Assessment & Plan Note (Signed)
Chronic, stable.  He is on atorvastatin and irbesartan which may be contributing to his low platelet count.  Will refer to hematology for further evaluation and assistance with managing.

## 2021-11-12 NOTE — Assessment & Plan Note (Signed)
Chronic, stable.  Referral to nephrology made today.  Encouraged him to focus on hydration throughout the day.  Encouraged him to follow a healthy diet specifically Mediterranean diet.  We will check urine for albuminuria today.  Patient to continue on his ARB.

## 2021-11-12 NOTE — Assessment & Plan Note (Addendum)
Chronic, at goal.  Patient to continue taking chronic medications : Amlodipine 5 mg/day, and irbesartan 75 mg/day.  Encouraged to focus on healthy diet specifically Mediterranean diet.

## 2021-11-12 NOTE — Patient Instructions (Addendum)
Mediterranean Diet A Mediterranean diet refers to food and lifestyle choices that are based on the traditions of countries located on the [employer]. It focuses on eating more fruits, vegetables, whole grains, beans, nuts, seeds, and heart-healthy fats, and eating less dairy, meat, eggs, and processed foods with added sugar, salt, and fat. This way of eating has been shown to help prevent certain conditions and improve outcomes for people who have chronic diseases, like kidney disease and heart disease. What are tips for following this plan? Reading food labels Check the serving size of packaged foods. For foods such as rice and pasta, the serving size refers to the amount of cooked product, not dry. Check the total fat in packaged foods. Avoid foods that have saturated fat or trans fats. Check the ingredient list for added sugars, such as corn syrup. Shopping  Buy a variety of foods that offer a balanced diet, including: Fresh fruits and vegetables (produce). Grains, beans, nuts, and seeds. Some of these may be available in unpackaged forms or large amounts (in bulk). Fresh seafood. Poultry and eggs. Low-fat dairy products. Buy whole ingredients instead of prepackaged foods. Buy fresh fruits and vegetables in-season from local farmers markets. Buy plain frozen fruits and vegetables. If you do not have access to quality fresh seafood, buy precooked frozen shrimp or canned fish, such as tuna, salmon, or sardines. Stock your pantry so you always have certain foods on hand, such as olive oil, canned tuna, canned tomatoes, rice, pasta, and beans. Cooking Cook foods with extra-virgin olive oil instead of using butter or other vegetable oils. Have meat as a side dish, and have vegetables or grains as your main dish. This means having meat in small portions or adding small amounts of meat to foods like pasta or stew. Use beans or vegetables instead of meat in common dishes like chili or  lasagna. Experiment with different cooking methods. Try roasting, broiling, steaming, and sauting vegetables. Add frozen vegetables to soups, stews, pasta, or rice. Add nuts or seeds for added healthy fats and plant protein at each meal. You can add these to yogurt, salads, or vegetable dishes. Marinate fish or vegetables using olive oil, lemon juice, garlic, and fresh herbs. Meal planning Plan to eat one vegetarian meal one day each week. Try to work up to two vegetarian meals, if possible. Eat seafood two or more times a week. Have healthy snacks readily available, such as: Vegetable sticks with hummus. Greek yogurt. Fruit and nut trail mix. Eat balanced meals throughout the week. This includes: Fruit: 2-3 servings a day. Vegetables: 4-5 servings a day. Low-fat dairy: 2 servings a day. Fish, poultry, or lean meat: 1 serving a day. Beans and legumes: 2 or more servings a week. Nuts and seeds: 1-2 servings a day. Whole grains: 6-8 servings a day. Extra-virgin olive oil: 3-4 servings a day. Limit red meat and sweets to only a few servings a month. Lifestyle  Cook and eat meals together with your family, when possible. Drink enough fluid to keep your urine pale yellow. Be physically active every day. This includes: Aerobic exercise like running or swimming. Leisure activities like gardening, walking, or housework. Get 7-8 hours of sleep each night. If recommended by your health care provider, drink red wine in moderation. This means 1 glass a day for nonpregnant women and 2 glasses a day for men. A glass of wine equals 5 oz (150 mL). What foods should I eat? Fruits Apples. Apricots. Avocado. Berries. Bananas. Cherries. Dates.  Figs. Grapes. Lemons. Melon. Oranges. Peaches. Plums. Pomegranate. Vegetables Artichokes. Beets. Broccoli. Cabbage. Carrots. Eggplant. Green beans. Chard. Kale. Spinach. Onions. Leeks. Peas. Squash. Tomatoes. Peppers. Radishes. Grains Whole-grain pasta. Brown  rice. Bulgur wheat. Polenta. Couscous. Whole-wheat bread. [place]. Meats and other proteins Beans. Almonds. Sunflower seeds. Pine nuts. Peanuts. [place]. Salmon. Scallops. Shrimp. [place]. Tilapia. Clams. Oysters. Eggs. Poultry without skin. Dairy Low-fat milk. Cheese. Greek yogurt. Fats and oils Extra-virgin olive oil. Avocado oil. Grapeseed oil. Beverages Water. Red wine. Herbal tea. Sweets and desserts Greek yogurt with honey. Baked apples. Poached pears. Trail mix. Seasonings and condiments Basil. Cilantro. Coriander. Cumin. Mint. Parsley. Sage. Rosemary. Tarragon. Garlic. Oregano. Thyme. Pepper. Balsamic vinegar. Tahini. Hummus. Tomato sauce. Olives. Mushrooms. The items listed above may not be a complete list of foods and beverages you can eat. Contact a dietitian for more information. What foods should I limit? This is a list of foods that should be eaten rarely or only on special occasions. Fruits Fruit canned in syrup. Vegetables Deep-fried potatoes (french fries). Grains Prepackaged pasta or rice dishes. Prepackaged cereal with added sugar. Prepackaged snacks with added sugar. Meats and other proteins Beef. Pork. Lamb. Poultry with skin. Hot dogs. [place] Salines. Dairy Ice cream. Sour cream. Whole milk. Fats and oils Butter. Canola oil. Vegetable oil. Beef fat (tallow). Lard. Beverages Juice. Sugar-sweetened soft drinks. Beer. Liquor and spirits. Sweets and desserts Cookies. Cakes. Pies. Candy. Seasonings and condiments Mayonnaise. Pre-made sauces and marinades. The items listed above may not be a complete list of foods and beverages you should limit. Contact a dietitian for more information. Summary The Mediterranean diet includes both food and lifestyle choices. Eat a variety of fresh fruits and vegetables, beans, nuts, seeds, and whole grains. Limit the amount of red meat and sweets that you eat. If recommended by your health care provider, drink red wine in moderation.  This means 1 glass a day for nonpregnant women and 2 glasses a day for men. A glass of wine equals 5 oz (150 mL). This information is not intended to replace advice given to you by your health care provider. Make sure you discuss any questions you have with your health care provider. Document Revised: 07/13/2019 Document Reviewed: 05/10/2019  Chronic Kidney Disease, Adult Chronic kidney disease is when lasting damage happens to the kidneys slowly over a long time. The kidneys help to: Make pee (urine). Make hormones. Keep the right amount of fluids and chemicals in the body. Most often, this disease does not go away. You must take steps to help keep the kidney damage from getting worse. If steps are not taken, the kidneys might stop working forever. What are the causes? Diabetes. High blood pressure. Diseases that affect the heart and blood vessels. Other kidney diseases. Diseases of the body's disease-fighting system. A problem with the flow of pee. Infections of the organs that make pee, store it, and take it out of the body. Swelling or irritation of your blood vessels. What increases the risk? Getting older. Having someone in your family who has kidney disease or kidney failure. Having a disease caused by genes. Taking medicines often that harm the kidneys. Being near or having contact with harmful substances. Being very overweight. Using tobacco now or in the past. What are the signs or symptoms? Feeling very tired. Having a swollen face, legs, ankles, or feet. Feeling like you may vomit or vomiting. Not feeling hungry. Being confused or not able to focus. Twitches and cramps in the leg muscles or other  muscles. Dry, itchy skin. A taste of metal in your mouth. Making less pee, or making more pee. Shortness of breath. Trouble sleeping. You may also become anemic or get weak bones. Anemic means there is not enough red blood cells or hemoglobin in your blood. You may get  symptoms slowly. You may not notice them until the kidney damage gets very bad. How is this treated? Often, there is no cure for this disease. Treatment can help with symptoms and help keep the disease from getting worse. You may need to: Avoid alcohol. Avoid foods that are high in salt, potassium, phosphorous, and protein. Take medicines for symptoms and to help control other conditions. Have dialysis. This treatment gets harmful waste out of your body. Treat other problems that cause your kidney disease or make it worse. Follow these instructions at home: Medicines Take over-the-counter and prescription medicines only as told by your doctor. Do not take any new medicines, vitamins, or supplements unless your doctor says it is okay. Lifestyle  Do not smoke or use any products that contain nicotine or tobacco. If you need help quitting, ask your doctor. If you drink alcohol: Limit how much you use to: 0-1 drink a day for women who are not pregnant. 0-2 drinks a day for men. Know how much alcohol is in your drink. In the [place] one drink equals one 12 oz bottle of beer (355 mL), one 5 oz glass of wine (148 mL), or one 1 oz glass of hard liquor (44 mL). Stay at a healthy weight. If you need help losing weight, ask your doctor. General instructions  Follow instructions from your doctor about what you cannot eat or drink. Track your blood pressure at home. Tell your doctor about any changes. If you have diabetes, track your blood sugar. Exercise at least 30 minutes a day, 5 days a week. Keep your shots (vaccinations) up to date. Keep all follow-up visits. Where to find more information American Association of Kidney Patients: BombTimer.gl National Kidney Foundation: www.kidney.[place]: https://mathis.com/ Life Options: www.lifeoptions.org Kidney School: www.kidneyschool.org Contact a doctor if: Your symptoms get worse. You get new symptoms. Get help right away if: You  get symptoms of end-stage kidney disease. These include: Headaches. Losing feeling in your hands or feet. Easy bruising. Having hiccups often. Chest pain. Shortness of breath. Lack of menstrual periods, in women. You have a fever. You make less pee than normal. You have pain or you bleed when you pee or poop. These symptoms may be an emergency. Get help right away. Call your local emergency services (911 in the [place] Do not wait to see if the symptoms will go away. Do not drive yourself to the hospital. Summary Chronic kidney disease is when lasting damage happens to the kidneys slowly over a long time. Causes of this disease include diabetes and high blood pressure. Often, there is no cure for this disease. Treatment can help symptoms and help keep the disease from getting worse. Treatment may involve lifestyle changes, medicines, and dialysis. This information is not intended to replace advice given to you by your health care provider. Make sure you discuss any questions you have with your health care provider. Document Revised: 09/12/2019 Document Reviewed: 09/12/2019 Elsevier Patient Education  [place] Patient Education  [place].

## 2021-11-13 ENCOUNTER — Telehealth: Payer: Self-pay | Admitting: Physician Assistant

## 2021-11-13 LAB — MICROALBUMIN / CREATININE URINE RATIO
Creatinine,U: 147.5 mg/dL
Microalb Creat Ratio: 0.5 mg/g (ref 0.0–30.0)
Microalb, Ur: <0.7 mg/dL (ref 0.0–1.9)

## 2021-11-13 NOTE — Telephone Encounter (Signed)
Scheduled appt per 5/25 referral. Pt is aware of appt date and time. Pt is aware to arrive 15 mins prior to appt time and to bring and updated insurance card. Pt is aware of appt location.

## 2021-11-30 ENCOUNTER — Telehealth: Payer: Self-pay | Admitting: Physician Assistant

## 2021-11-30 NOTE — Telephone Encounter (Signed)
R/s pt's new hem appt per pt request. Pt is aware of new appt date and time.

## 2021-12-01 ENCOUNTER — Inpatient Hospital Stay: Payer: Medicare Other

## 2021-12-01 ENCOUNTER — Inpatient Hospital Stay: Payer: Medicare Other | Admitting: Physician Assistant

## 2021-12-17 NOTE — Progress Notes (Deleted)
[facility] [phone]   Fax:([phone]) [facility] NOTE  Patient Care Team: Ailene Ards, NP as PCP - General (Nurse Practitioner)  CHIEF COMPLAINTS/PURPOSE OF CONSULTATION:  Thrombocytopenia  HISTORY OF PRESENTING ILLNESS:  [name] [name] 71 y.o. male with medical history significant for ***  On review of the previous records ***  On exam today ***  MEDICAL HISTORY:  Past Medical History:  Diagnosis Date   Hypercholesteremia    Hypertension     SURGICAL HISTORY: Past Surgical History:  Procedure Laterality Date   INCISION AND DRAINAGE OF WOUND Right 05/25/2016   Procedure: IRRIGATION AND DEBRIDEMENT WOUND RIGHT FOOT;  Surgeon: Edrick Kins, DPM;  Location: [facility];  Service: Podiatry;  Laterality: Right;   LYMPHADENECTOMY Bilateral 01/08/2019   Procedure: LYMPHADENECTOMY, PELVIC BILATERAL;  Surgeon: Raynelle Bring, MD;  Location: [facility];  Service: Urology;  Laterality: Bilateral;   ROBOT ASSISTED LAPAROSCOPIC RADICAL PROSTATECTOMY N/A 01/08/2019   Procedure: XI ROBOTIC ASSISTED LAPAROSCOPIC RADICAL PROSTATECTOMY LEVEL 2;  Surgeon: Raynelle Bring, MD;  Location: [facility];  Service: Urology;  Laterality: N/A;   TIBIA IM NAIL INSERTION Right 02/12/2017   Procedure: INTRAMEDULLARY (IM) NAIL TIBIAL, RIGHT;  Surgeon: Leandrew Koyanagi, MD;  Location: [facility];  Service: Orthopedics;  Laterality: Right;    SOCIAL HISTORY: Social History   Socioeconomic History   Marital status: Married    Spouse name: Not on file   Number of children: Not on file   Years of education: Not on file   Highest education level: Not on file  Occupational History   Not on file  Tobacco Use   Smoking status: Never   Smokeless tobacco: Never  Vaping Use   Vaping Use: Never used  Substance and Sexual Activity   Alcohol use: No   Drug use: No   Sexual activity: Yes  Other Topics Concern   Not on file  Social History Narrative   Not on file   Social  Determinants of Health   Financial Resource Strain: Low Risk  (01/05/2019)   Overall Financial Resource Strain (CARDIA)    Difficulty of Paying Living Expenses: Not hard at all  Food Insecurity: Unknown (01/05/2019)   Hunger Vital Sign    Worried About [place] in the Last Year: Patient refused    [place] in the Last Year: Patient refused  Transportation Needs: Unknown (01/05/2019)   PRAPARE - Hydrologist (Medical): Patient refused    Lack of Transportation (Non-Medical): Patient refused  Physical Activity: Not on file  Stress: Not on file  Social Connections: Not on file  Intimate Partner Violence: Not At Risk (01/05/2019)   Humiliation, Afraid, Rape, and Kick questionnaire    Fear of Current or Ex-Partner: No    Emotionally Abused: No    Physically Abused: No    Sexually Abused: No    FAMILY HISTORY: Family History  Problem Relation Age of Onset   Cancer Mother    Heart disease Father    Hypertension Father     ALLERGIES:  is allergic to doxycycline and penicillins.  MEDICATIONS:  Current Outpatient Medications  Medication Sig Dispense Refill   amLODipine (NORVASC) 5 MG tablet TAKE 1 TABLET(5 MG) BY MOUTH DAILY. Please schedule office visit for more refills. 90 tablet 2   atorvastatin (LIPITOR) 20 MG tablet TAKE 1 TABLET(20 MG) BY MOUTH EVERY EVENING 90 tablet 2   ibuprofen (ADVIL) 200 MG tablet Take  200 mg by mouth every 6 (six) hours as needed.     irbesartan (AVAPRO) 75 MG tablet Take 1 tablet (75 mg total) by mouth daily. 90 tablet 2   promethazine-dextromethorphan (PROMETHAZINE-DM) 6.25-15 MG/5ML syrup Take 5 mLs by mouth 4 (four) times daily as needed for cough. 100 mL 0   No current facility-administered medications for this visit.    REVIEW OF SYSTEMS:   Constitutional: ( - ) fevers, ( - )  chills , ( - ) night sweats Eyes: ( - ) blurriness of vision, ( - ) double vision, ( - ) watery eyes Ears, nose, mouth,  throat, and face: ( - ) mucositis, ( - ) sore throat Respiratory: ( - ) cough, ( - ) dyspnea, ( - ) wheezes Cardiovascular: ( - ) palpitation, ( - ) chest discomfort, ( - ) lower extremity swelling Gastrointestinal:  ( - ) nausea, ( - ) heartburn, ( - ) change in bowel habits Skin: ( - ) abnormal skin rashes Lymphatics: ( - ) new lymphadenopathy, ( - ) easy bruising Neurological: ( - ) numbness, ( - ) tingling, ( - ) new weaknesses Behavioral/Psych: ( - ) mood change, ( - ) new changes  All other systems were reviewed with the patient and are negative.  PHYSICAL EXAMINATION: ECOG PERFORMANCE STATUS: {CHL ONC ECOG [phone]}  There were no vitals filed for this visit. There were no vitals filed for this visit.  GENERAL: well appearing *** in NAD  SKIN: skin color, texture, turgor are normal, no rashes or significant lesions EYES: conjunctiva are pink and non-injected, sclera clear OROPHARYNX: no exudate, no erythema; lips, buccal mucosa, and tongue normal  NECK: supple, non-tender LYMPH:  no palpable lymphadenopathy in the cervical, axillary or supraclavicular lymph nodes.  LUNGS: clear to auscultation and percussion with normal breathing effort HEART: regular rate & rhythm and no murmurs and no lower extremity edema ABDOMEN: soft, non-tender, non-distended, normal bowel sounds Musculoskeletal: no cyanosis of digits and no clubbing  PSYCH: alert & oriented x 3, fluent speech NEURO: no focal motor/sensory deficits  LABORATORY DATA:  I have reviewed the data as listed    Latest Ref Rng & Units 03/27/2021    8:34 AM 03/03/2020   10:45 AM 01/09/2019    5:38 AM  CBC  WBC 4.0 - 10.5 K/uL 5.2  4.5    Hemoglobin 13.0 - 17.0 g/dL 13.2  13.8  13.6   Hematocrit 39.0 - 52.0 % 40.0  41.8  40.6   Platelets 150.0 - 400.0 K/uL 116.0  105         Latest Ref Rng & Units 03/27/2021    8:34 AM 03/03/2020   10:45 AM 01/05/2019    9:04 AM  CMP  Glucose 70 - 99 mg/dL 87  79  97   BUN 6 - 23  mg/dL '23  21  26   '$ Creatinine 0.40 - 1.50 mg/dL 1.34  1.50  1.39   Sodium 135 - 145 mEq/L 141  141  140   Potassium 3.5 - 5.1 mEq/L 3.6  3.8  3.8   Chloride 96 - 112 mEq/L 105  106  107   CO2 19 - 32 mEq/L '30  30  27   '$ Calcium 8.4 - 10.5 mg/dL 9.1  9.3  8.9   Total Protein 6.0 - 8.3 g/dL 7.4  6.7    Total Bilirubin 0.2 - 1.2 mg/dL 0.7  0.8    Alkaline Phos 39 - 117 U/L 68  AST 0 - 37 U/L 26  25    ALT 0 - 53 U/L 18  23       PATHOLOGY: ***  BLOOD FILM: *** Review of the peripheral blood smear showed normal appearing white cells with neutrophils that were appropriately lobated and granulated. There was no predominance of bi-lobed or hyper-segmented neutrophils appreciated. No Dohle bodies were noted. There was no left shifting, immature forms or blasts noted. Lymphocytes remain normal in size without any predominance of large granular lymphocytes. Red cells show no anisopoikilocytosis, macrocytes , microcytes or polychromasia. There were no schistocytes, target cells, echinocytes, acanthocytes, dacrocytes, or stomatocytes.There was no rouleaux formation, nucleated red cells, or intra-cellular inclusions noted. The platelets are normal in size, shape, and color without any clumping evident.  RADIOGRAPHIC STUDIES: I have personally reviewed the radiological images as listed and agreed with the findings in the report. No results found.  ASSESSMENT & PLAN [name] [name] is a 71 y.o. male who presents to the clinic for evaluation for thrombocytopenia.    I reviewed potential etiologies for thrombocytopenia including liver disease, splenomegaly, infectious processes, nutritional anemias, immune mediated and bone marrow disorders. Patient is not taking any medications or recent infections. Patient will proceed with labs today to check CBC, CMP, Vitamin B12, MMA, folate, LDH, Hepatitis B and C serologies, HIV serology, platelet by citrate and save smear.   #Thrombocytopenia, etiology  unknown: --Labs today to check CBC w/diff, CMP, B12 level, folate level, Hep B and C serologies, HIV serology and immature platelet fraction. --Evaluate for platelet abnormality with save smear. --Evaluate for liver disease and/or splenomegaly with abdominal US --RTC based on above workup.    No orders of the defined types were placed in this encounter.   All questions were answered. The patient knows to call the clinic with any problems, questions or concerns.  I have spent a total of {CHL ONC TIME VISIT - [id number]} minutes of face-to-face and non-face-to-face time, preparing to see the patient, obtaining and/or reviewing separately obtained history, performing a medically appropriate examination, counseling and educating the patient, ordering medications/tests/procedures, referring and communicating with other health care professionals, documenting clinical information in the electronic health record, independently interpreting results and communicating results to the patient, and care coordination.   Dede Query, PA-C Department of Hematology/Oncology [place] at [facility] Phone: [phone]

## 2021-12-18 ENCOUNTER — Inpatient Hospital Stay: Payer: Medicare Other

## 2021-12-18 ENCOUNTER — Inpatient Hospital Stay: Payer: Medicare Other | Attending: Physician Assistant | Admitting: Physician Assistant

## 2022-05-20 ENCOUNTER — Encounter: Payer: Medicare Other | Admitting: Nurse Practitioner

## 2022-05-20 NOTE — Telephone Encounter (Signed)
Message recieved

## 2022-06-10 ENCOUNTER — Encounter: Payer: Medicare Other | Admitting: Nurse Practitioner

## 2022-07-01 ENCOUNTER — Ambulatory Visit (INDEPENDENT_AMBULATORY_CARE_PROVIDER_SITE_OTHER): Payer: Medicare Other | Admitting: Nurse Practitioner

## 2022-07-01 VITALS — BP 138/80 | HR 51 | Temp 98.1°F | Ht 73.0 in | Wt 236.2 lb

## 2022-07-01 DIAGNOSIS — E7849 Other hyperlipidemia: Secondary | ICD-10-CM | POA: Diagnosis not present

## 2022-07-01 DIAGNOSIS — D696 Thrombocytopenia, unspecified: Secondary | ICD-10-CM

## 2022-07-01 DIAGNOSIS — Z23 Encounter for immunization: Secondary | ICD-10-CM | POA: Insufficient documentation

## 2022-07-01 DIAGNOSIS — R7303 Prediabetes: Secondary | ICD-10-CM

## 2022-07-01 DIAGNOSIS — N1831 Chronic kidney disease, stage 3a: Secondary | ICD-10-CM | POA: Diagnosis not present

## 2022-07-01 DIAGNOSIS — Z0001 Encounter for general adult medical examination with abnormal findings: Secondary | ICD-10-CM

## 2022-07-01 DIAGNOSIS — Z1211 Encounter for screening for malignant neoplasm of colon: Secondary | ICD-10-CM | POA: Insufficient documentation

## 2022-07-01 HISTORY — DX: Encounter for immunization: Z23

## 2022-07-01 HISTORY — DX: Encounter for screening for malignant neoplasm of colon: Z12.11

## 2022-07-01 NOTE — Assessment & Plan Note (Signed)
Patient reported that Dr. Alinda Money is taking care of this, we discussed that more than likely Dr. Alinda Money is focusing on prostate and urinary tract as opposed to colon.  And that gastroenterology would be the specialty to see for colon cancer screening.  Patient reported understanding.  Referral to GI made today for colon cancer screening.

## 2022-07-01 NOTE — Assessment & Plan Note (Signed)
Chronic, check serum lipid panel today.  For now patient to continue on Lipitor 20 mg daily.

## 2022-07-01 NOTE — Assessment & Plan Note (Signed)
Educated patient about thrombocytopenia and some potential causes and then I still would recommend he see a hematologist for further evaluation.  Will refer him today.  Patient reports understanding.  Check CBC with platelet count for further evaluation as well.

## 2022-07-01 NOTE — Assessment & Plan Note (Signed)
Order metabolic panel, may consider referring to nephrology based on these results.

## 2022-07-01 NOTE — Assessment & Plan Note (Signed)
Patient encouraged to continue take medication as prescribed, follow-up with specialist as previously recommended.  Flu shot administered today, VIS provided.

## 2022-07-01 NOTE — Assessment & Plan Note (Signed)
Chronic, check A1c.  Further recommendations may be made based upon these results.

## 2022-07-01 NOTE — Assessment & Plan Note (Signed)
Flu shot administered, VIS provided. 

## 2022-07-01 NOTE — Progress Notes (Signed)
Established Patient Office Visit  Subjective   Patient ID: [name] [name], male    DOB: 1950-10-12  Age: 72 y.o. MRN: [id number]  Chief Complaint  Patient presents with   Annual Exam   Thrombocytopenia: Incidental finding on previous labs.  Had been referred to hematology in the past does not appear patient ever went to appointment for evaluation.  Chronic kidney disease: Not clear whether or not patient has seen specialist as referred in the past.  Health maintenance: Has history of prostate cancer and underwent radical prostatectomy back in 2020 with Dr. Alinda Money.  Reports that he still follows up with him on a yearly basis to have PSA monitored.  Per chart review does not appear patient has ever had colon cancer screening completed.  Patient due for flu shot today.    Review of Systems  Constitutional:  Negative for fever, malaise/fatigue and weight loss.  Eyes:  Negative for blurred vision.  Respiratory:  Negative for shortness of breath.   Cardiovascular:  Negative for chest pain.  Gastrointestinal:  Negative for abdominal pain, blood in stool, constipation, diarrhea, nausea and vomiting.  Genitourinary:  Negative for dysuria and hematuria.  Neurological:  Negative for seizures, loss of consciousness and headaches.  Psychiatric/Behavioral:  Negative for depression and suicidal ideas. The patient is not nervous/anxious.       Objective:     BP 138/80   Pulse (!) 51   Temp 98.1 F (36.7 C) (Temporal)   Ht '6\' 1"'$  (1.854 m)   Wt 236 lb 4 oz (107.2 kg)   SpO2 96%   BMI 31.17 kg/m  BP Readings from Last 3 Encounters:  07/01/22 138/80  11/12/21 118/74  03/27/21 136/88   Wt Readings from Last 3 Encounters:  07/01/22 236 lb 4 oz (107.2 kg)  11/12/21 232 lb (105.2 kg)  03/27/21 228 lb (103.4 kg)      Physical Exam Vitals reviewed.  Constitutional:      General: He is not in acute distress.    Appearance: Normal appearance. He is not ill-appearing.  HENT:      Head: Normocephalic and atraumatic.     Right Ear: Tympanic membrane, ear canal and external ear normal.     Left Ear: Tympanic membrane, ear canal and external ear normal.  Eyes:     General: No scleral icterus.    Extraocular Movements: Extraocular movements intact.     Conjunctiva/sclera: Conjunctivae normal.     Pupils: Pupils are equal, round, and reactive to light.  Neck:     Vascular: No carotid bruit.  Cardiovascular:     Rate and Rhythm: Normal rate and regular rhythm.     Pulses: Normal pulses.     Heart sounds: Normal heart sounds.  Pulmonary:     Effort: Pulmonary effort is normal.     Breath sounds: Normal breath sounds.  Abdominal:     General: Bowel sounds are normal. There is no distension.     Palpations: There is no mass.     Tenderness: There is no abdominal tenderness.     Hernia: No hernia is present.  Musculoskeletal:        General: No swelling or tenderness.     Cervical back: Normal range of motion and neck supple. No rigidity.  Lymphadenopathy:     Cervical: No cervical adenopathy.  Skin:    General: Skin is warm and dry.  Neurological:     General: No focal deficit present.     Mental Status:  He is alert and oriented to person, place, and time.     Cranial Nerves: No cranial nerve deficit.     Sensory: No sensory deficit.     Motor: No weakness.     Gait: Gait normal.  Psychiatric:        Mood and Affect: Mood normal.        Behavior: Behavior normal.        Judgment: Judgment normal.      No results found for any visits on 07/01/22.    The 10-year ASCVD risk score (Arnett DK, et al., 2019) is: 20.5%    Assessment & Plan:   Problem List Items Addressed This Visit       Genitourinary   Chronic kidney disease    Order metabolic panel, may consider referring to nephrology based on these results.      Relevant Orders   Comprehensive metabolic panel     Hematopoietic and Hemostatic   Thrombocytopenia ([place])    Educated patient  about thrombocytopenia and some potential causes and then I still would recommend he see a hematologist for further evaluation.  Will refer him today.  Patient reports understanding.  Check CBC with platelet count for further evaluation as well.      Relevant Orders   Ambulatory referral to Hematology / Oncology   CBC     Other   Hyperlipidemia    Chronic, check serum lipid panel today.  For now patient to continue on Lipitor 20 mg daily.      Relevant Orders   Lipid panel   Need for vaccination    Flu shot administered, VIS provided.      Relevant Orders   Flu Vaccine QUAD High Dose(Fluad) (Completed)   Prediabetes    Chronic, check A1c.  Further recommendations may be made based upon these results.      Relevant Orders   Hemoglobin A1c   Colon cancer screening    Patient reported that Dr. Alinda Money is taking care of this, we discussed that more than likely Dr. Alinda Money is focusing on prostate and urinary tract as opposed to colon.  And that gastroenterology would be the specialty to see for colon cancer screening.  Patient reported understanding.  Referral to GI made today for colon cancer screening.      Relevant Orders   Ambulatory referral to Gastroenterology   Encounter for general adult medical examination with abnormal findings - Primary    Patient encouraged to continue take medication as prescribed, follow-up with specialist as previously recommended.  Flu shot administered today, VIS provided.       Return in about 6 months (around 12/30/2022) for F/u with Judson Roch.    Ailene Ards, NP

## 2022-08-02 ENCOUNTER — Encounter: Payer: Self-pay | Admitting: Nurse Practitioner

## 2022-08-03 ENCOUNTER — Other Ambulatory Visit: Payer: Self-pay | Admitting: Nurse Practitioner

## 2022-08-03 DIAGNOSIS — I10 Essential (primary) hypertension: Secondary | ICD-10-CM

## 2022-08-03 DIAGNOSIS — E7849 Other hyperlipidemia: Secondary | ICD-10-CM

## 2022-08-04 ENCOUNTER — Ambulatory Visit: Payer: Medicare Other

## 2022-08-04 ENCOUNTER — Encounter: Payer: Self-pay | Admitting: Student

## 2022-08-04 ENCOUNTER — Ambulatory Visit (INDEPENDENT_AMBULATORY_CARE_PROVIDER_SITE_OTHER): Payer: Medicare Other | Admitting: Internal Medicine

## 2022-08-04 VITALS — BP 149/79 | HR 49 | Temp 97.7°F | Ht 73.0 in | Wt 239.0 lb

## 2022-08-04 DIAGNOSIS — Z1211 Encounter for screening for malignant neoplasm of colon: Secondary | ICD-10-CM

## 2022-08-04 DIAGNOSIS — D696 Thrombocytopenia, unspecified: Secondary | ICD-10-CM | POA: Diagnosis not present

## 2022-08-04 DIAGNOSIS — I129 Hypertensive chronic kidney disease with stage 1 through stage 4 chronic kidney disease, or unspecified chronic kidney disease: Secondary | ICD-10-CM

## 2022-08-04 DIAGNOSIS — E7849 Other hyperlipidemia: Secondary | ICD-10-CM | POA: Diagnosis not present

## 2022-08-04 DIAGNOSIS — N1831 Chronic kidney disease, stage 3a: Secondary | ICD-10-CM

## 2022-08-04 DIAGNOSIS — N189 Chronic kidney disease, unspecified: Secondary | ICD-10-CM

## 2022-08-04 DIAGNOSIS — I10 Essential (primary) hypertension: Secondary | ICD-10-CM

## 2022-08-04 DIAGNOSIS — R7303 Prediabetes: Secondary | ICD-10-CM

## 2022-08-04 DIAGNOSIS — Z131 Encounter for screening for diabetes mellitus: Secondary | ICD-10-CM

## 2022-08-04 DIAGNOSIS — I440 Atrioventricular block, first degree: Secondary | ICD-10-CM

## 2022-08-04 DIAGNOSIS — C61 Malignant neoplasm of prostate: Secondary | ICD-10-CM

## 2022-08-04 NOTE — Patient Instructions (Signed)
[name], it was a pleasure seeing you today! You endorsed feeling well today. Below are some of the things we talked about this visit. We look forward to seeing you in the follow up appointment!  Today we discussed: For your high blood pressure, we will continue your current medications. Please check your blood pressure daily. Please come back in month.   I have ordered the following labs today:   Lab Orders         CBC with Diff         CMP14 + Anion Gap         Lipid Profile         Hemoglobin A1c       Referrals ordered today:   Referral Orders  No referral(s) requested today     I have ordered the following medication/changed the following medications:   Stop the following medications: There are no discontinued medications.   Start the following medications: No orders of the defined types were placed in this encounter.    Follow-up: 1 month follow up  Please make sure to arrive 15 minutes prior to your next appointment. If you arrive late, you may be asked to reschedule.   We look forward to seeing you next time. Please call our clinic at [phone] if you have any questions or concerns. The best time to call is Monday-Friday from 9am-4pm, but there is someone available 24/7. If after hours or the weekend, call the main hospital number and ask for the Internal Medicine Resident On-Call. If you need medication refills, please notify your pharmacy one week in advance and they will send [place] a request.  Thank you for letting us take part in your care. Wishing you the best!  Thank you, Idamae Schuller, MD

## 2022-08-04 NOTE — Progress Notes (Signed)
CC: establishment of care  HPI:  Mr.Hiroto Plett is a 72 y.o. male with a past medical history of  and presenting to the clinic today for HTN, HLD, CKDIIIa, prostate cancer s/p prostatectomy  2020, Prediabetes. Please see problem based assessment and plan for additional details.  Past Medical History:  Diagnosis Date   Hypercholesteremia    Hypertension       Current Outpatient Medications (Cardiovascular):    amLODipine (NORVASC) 5 MG tablet, TAKE 1 TABLET(5 MG) BY MOUTH DAILY. Please schedule office visit for more refills.   atorvastatin (LIPITOR) 20 MG tablet, TAKE 1 TABLET(20 MG) BY MOUTH EVERY EVENING   irbesartan (AVAPRO) 150 MG tablet, Take 1 tablet (150 mg total) by mouth daily.      Family History  Problem Relation Age of Onset   Cancer Mother    Heart disease Father    Hypertension Father     Social History: No smoking, drinking, no illicit substance use.   Review of Systems: ROS negative except for what is noted on the assessment and plan.  Vitals:   08/04/22 1629  BP: (!) 149/79  Pulse: (!) 49  Temp: 97.7 F (36.5 C)  TempSrc: Oral  SpO2: 100%  Weight: 239 lb (108.4 kg)  Height: 6' 1"$  (1.854 m)    Physical Exam: General: Well appearing, NAD HENT: normocephalic, atraumatic EYES: conjunctiva non-erythematous, no scleral icterus CV: regular rate, normal rhythm, no murmurs, rubs, gallops. Pulmonary: normal work of breathing on RA, lungs clear to auscultation, no rales, wheezes, rhonchi Abdominal: non-distended, soft, non-tender to palpation, normal BS Skin: Warm and dry, no rashes or lesions Neurological: MS: awake, alert and oriented x3, normal speech and fund of knowledge Motor: moves all extremities antigravity Psych: normal affect  Assessment & Plan:   CKD (chronic kidney disease), stage III ([place]) Pt has CKDIIIa. Creatinine elevated previously including last year. Cr this visit elevated at 1.6. Pt BP is elevated but pt does not check it  at home.  Will get better control of his hypertension with increase in irbesartan as discussed previously.  Continue to monitor his CKD.  Will plan to get ACR at next visit.  Essential hypertension Patient has hypertension and is on amlodipine 5 mg and irbesartan 75 mg daily.  He does not check his blood pressure at home.  In the clinic patient's BP uncontrolled with blood pressure 149/79.  CMP with elevated creatinine suggestive of chronic kidney disease.  I spoke with the patient regarding his results and he reported his home readings for the last 2 days which were consistent with office readings.  Suspect his CKD may be due to uncontrolled hypertension.  Will plan on increasing his irbesartan to 150 mg daily for better control.  Will follow-up in 6 weeks with blood work.  Prostate cancer [facility]) Patient has history of prostate cancer and is status post prostatectomy in 2020.  Patient was following with urology every 6 months and that has been changed to a yearly follow-up.  Advised patient to continue follow-up with urology.  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  Patient has long history of this.  Will avoid rate lowering medications.  At this point we will continue to monitor this.    Prediabetes Patient has prediabetes and his A1c is 6.0.  Advised patient to continue lifestyle modifications including eating healthier food and exercising regularly.  Thrombocytopenia ([place]) Patient has history of thrombocytopenia.  CBC showed stable thrombocytopenia.  Plan was to add a  pathology smear but this could not be added.  Will add this as a future order and add immature platelet fraction.  Can consider hematology referral after results of pathology smear and immature fraction.  Hyperlipidemia Patient has hyperlipidemia and is taking atorvastatin 20 mg.  Repeat lipid panel shows LDL of 87.  ASCVD calculated and is at 11% with recommendation of moderate to high intensity.  If  BP more controlled the rest drops to help 7.5%.  We are working to control his BP.  Will plan to continue his Lipitor 20 mg.   See Encounters Tab for problem based charting.  Patient discussed with Dr. Denzil Magnuson, MD Tillie Rung. [facility] Internal Medicine Residency, PGY-2

## 2022-08-05 LAB — CBC WITH DIFFERENTIAL/PLATELET
Basophils Absolute: 0 10*3/uL (ref 0.0–0.2)
Basos: 0 %
EOS (ABSOLUTE): 0 10*3/uL (ref 0.0–0.4)
Eos: 1 %
Hematocrit: 42.7 % (ref 37.5–51.0)
Hemoglobin: 14.2 g/dL (ref 13.0–17.7)
Immature Grans (Abs): 0 10*3/uL (ref 0.0–0.1)
Immature Granulocytes: 0 %
Lymphocytes Absolute: 2 10*3/uL (ref 0.7–3.1)
Lymphs: 38 %
MCH: 30.1 pg (ref 26.6–33.0)
MCHC: 33.3 g/dL (ref 31.5–35.7)
MCV: 91 fL (ref 79–97)
Monocytes Absolute: 0.5 10*3/uL (ref 0.1–0.9)
Monocytes: 9 %
Neutrophils Absolute: 2.7 10*3/uL (ref 1.4–7.0)
Neutrophils: 52 %
Platelets: 120 10*3/uL — ABNORMAL LOW (ref 150–450)
RBC: 4.71 x10E6/uL (ref 4.14–5.80)
RDW: 13.5 % (ref 11.6–15.4)
WBC: 5.3 10*3/uL (ref 3.4–10.8)

## 2022-08-05 LAB — CMP14 + ANION GAP
ALT: 17 IU/L (ref 0–44)
AST: 21 IU/L (ref 0–40)
Albumin/Globulin Ratio: 1.5 (ref 1.2–2.2)
Albumin: 4.3 g/dL (ref 3.9–4.9)
Alkaline Phosphatase: 87 IU/L (ref 44–121)
Anion Gap: 17 mmol/L (ref 10.0–18.0)
BUN/Creatinine Ratio: 12 (ref 10–24)
BUN: 19 mg/dL (ref 8–27)
Bilirubin Total: 0.3 mg/dL (ref 0.0–1.2)
CO2: 22 mmol/L (ref 20–29)
Calcium: 9.6 mg/dL (ref 8.6–10.2)
Chloride: 104 mmol/L (ref 96–106)
Creatinine, Ser: 1.61 mg/dL — ABNORMAL HIGH (ref 0.76–1.27)
Globulin, Total: 2.8 g/dL (ref 1.5–4.5)
Glucose: 105 mg/dL — ABNORMAL HIGH (ref 70–99)
Potassium: 3.8 mmol/L (ref 3.5–5.2)
Sodium: 143 mmol/L (ref 134–144)
Total Protein: 7.1 g/dL (ref 6.0–8.5)
eGFR: 46 mL/min/{1.73_m2} — ABNORMAL LOW (ref >59)

## 2022-08-05 LAB — HEMOGLOBIN A1C
Est. average glucose Bld gHb Est-mCnc: 126 mg/dL
Hgb A1c MFr Bld: 6 % — ABNORMAL HIGH (ref 4.8–5.6)

## 2022-08-05 LAB — LIPID PANEL
Chol/HDL Ratio: 4.5 ratio (ref 0.0–5.0)
Cholesterol, Total: 149 mg/dL (ref 100–199)
HDL: 33 mg/dL — ABNORMAL LOW (ref >39)
LDL Chol Calc (NIH): 87 mg/dL (ref 0–99)
Triglycerides: 168 mg/dL — ABNORMAL HIGH (ref 0–149)
VLDL Cholesterol Cal: 29 mg/dL (ref 5–40)

## 2022-08-07 DIAGNOSIS — I440 Atrioventricular block, first degree: Secondary | ICD-10-CM | POA: Insufficient documentation

## 2022-08-07 MED ORDER — IRBESARTAN 150 MG PO TABS: 150.0000 mg | ORAL_TABLET | Freq: Every day | ORAL | 0 refills | Status: DC

## 2022-08-07 NOTE — Assessment & Plan Note (Signed)
Patient has history of prostate cancer and is status post prostatectomy in 2020.  Patient was following with urology every 6 months and that has been changed to a yearly follow-up.  Advised patient to continue follow-up with urology.

## 2022-08-07 NOTE — Assessment & Plan Note (Signed)
Patient has prediabetes and his A1c is 6.0.  Advised patient to continue lifestyle modifications including eating healthier food and exercising regularly.

## 2022-08-07 NOTE — Assessment & Plan Note (Addendum)
Pt has CKDIIIa. Creatinine elevated previously including last year. Cr this visit elevated at 1.6. Pt BP is elevated but pt does not check it at home.  Will get better control of his hypertension with increase in irbesartan as discussed previously.  Continue to monitor his CKD.  Will plan to get ACR at next visit.

## 2022-08-07 NOTE — Assessment & Plan Note (Signed)
Patient has history of thrombocytopenia.  CBC showed stable thrombocytopenia.  Plan was to add a pathology smear but this could not be added.  Will add this as a future order and add immature platelet fraction.  Can consider hematology referral after results of pathology smear and immature fraction.

## 2022-08-07 NOTE — Assessment & Plan Note (Signed)
Patient has hypertension and is on amlodipine 5 mg and irbesartan 75 mg daily.  He does not check his blood pressure at home.  In the clinic patient's BP uncontrolled with blood pressure 149/79.  CMP with elevated creatinine suggestive of chronic kidney disease.  I spoke with the patient regarding his results and he reported his home readings for the last 2 days which were consistent with office readings.  Suspect his CKD may be due to uncontrolled hypertension.  Will plan on increasing his irbesartan to 150 mg daily for better control.  Will follow-up in 6 weeks with blood work.

## 2022-08-07 NOTE — Assessment & Plan Note (Signed)
Patient with asymptomatic first-degree AV block.  Patient has long history of this.  Will avoid rate lowering medications.  At this point we will continue to monitor this.

## 2022-08-07 NOTE — Assessment & Plan Note (Signed)
Patient has hyperlipidemia and is taking atorvastatin 20 mg.  Repeat lipid panel shows LDL of 87.  ASCVD calculated and is at 11% with recommendation of moderate to high intensity.  If BP more controlled the rest drops to help 7.5%.  We are working to control his BP.  Will plan to continue his Lipitor 20 mg.

## 2022-08-09 ENCOUNTER — Other Ambulatory Visit: Payer: Self-pay

## 2022-08-09 MED ORDER — IRBESARTAN 150 MG PO TABS: 150.0000 mg | ORAL_TABLET | Freq: Every day | ORAL | 0 refills | Status: DC

## 2022-08-09 NOTE — Telephone Encounter (Signed)
Pharmacy requesting 90 day supply

## 2022-08-10 NOTE — Progress Notes (Signed)
Internal Medicine Clinic Attending  Case discussed with Dr. Khan  At the time of the visit.  We reviewed the resident's history and exam and pertinent patient test results.  I agree with the assessment, diagnosis, and plan of care documented in the resident's note.  

## 2022-08-10 NOTE — Addendum Note (Signed)
Addended by: Charise Killian on: 08/10/2022 10:17 AM   Modules accepted: Level of Service

## 2022-08-11 ENCOUNTER — Other Ambulatory Visit: Payer: Self-pay

## 2022-08-11 DIAGNOSIS — E7849 Other hyperlipidemia: Secondary | ICD-10-CM

## 2022-08-11 DIAGNOSIS — I10 Essential (primary) hypertension: Secondary | ICD-10-CM

## 2022-08-12 MED ORDER — ATORVASTATIN CALCIUM 20 MG PO TABS: ORAL_TABLET | ORAL | 2 refills | Status: DC

## 2022-08-12 MED ORDER — AMLODIPINE BESYLATE 5 MG PO TABS: ORAL_TABLET | ORAL | 2 refills | Status: DC

## 2022-08-26 ENCOUNTER — Encounter: Payer: Medicare Other | Admitting: Internal Medicine

## 2022-08-26 ENCOUNTER — Ambulatory Visit (INDEPENDENT_AMBULATORY_CARE_PROVIDER_SITE_OTHER): Payer: Medicare Other

## 2022-08-26 DIAGNOSIS — Z0000 Encounter for general adult medical examination without abnormal findings: Secondary | ICD-10-CM | POA: Diagnosis not present

## 2022-08-26 NOTE — Progress Notes (Deleted)
   CC: HTN follow up  HPI:  Mr.Kellyn Butz is a 72 y.o. with medical history of HTN, HLD, Prostate cancer in remission presenting to [facility] for HTN follow up.   Please see problem-based list for further details, assessments, and plans.  Past Medical History:  Diagnosis Date   Hypercholesteremia    Hypertension      Current Outpatient Medications (Cardiovascular):    amLODipine (NORVASC) 5 MG tablet, TAKE 1 TABLET(5 MG) BY MOUTH DAILY. Please schedule office visit for more refills.   atorvastatin (LIPITOR) 20 MG tablet, TAKE 1 TABLET(20 MG) BY MOUTH EVERY EVENING   irbesartan (AVAPRO) 150 MG tablet, Take 1 tablet (150 mg total) by mouth daily.      Review of Systems:  Review of system negative unless stated in the problem list or HPI.    Physical Exam:  There were no vitals filed for this visit. Physical Exam General: NAD HENT: NCAT Lungs: CTAB, no wheeze, rhonchi or rales.  Cardiovascular: Normal heart sounds, no r/m/g, 2+ pulses in all extremities. No LE edema Abdomen: No TTP, normal bowel sounds MSK: No asymmetry or muscle atrophy.  Skin: no lesions noted on exposed skin Neuro: Alert and oriented x4. CN grossly intact Psych: Normal mood and normal affect   Assessment & Plan:   No problem-specific Assessment & Plan notes found for this encounter.   See Encounters Tab for problem based charting.  Patient Discussed with Dr. {NAMES:[id number]::"Guilloud","Hoffman","Mullen","Narendra","Vincent","Machen","Lau","Hatcher","Williams"} Idamae Schuller, MD Tillie Rung. [facility] Internal Medicine Residency, PGY-2

## 2022-08-26 NOTE — Progress Notes (Signed)
Subjective:   [name] [name] is a 72 y.o. male who presents for an Initial Medicare Annual Wellness Visit. I connected with  [name] [name] on 08/26/22 by a audio enabled telemedicine application and verified that I am speaking with the correct person using two identifiers.  Patient Location: Home  Provider Location: Office/Clinic  I discussed the limitations of evaluation and management by telemedicine. The patient expressed understanding and agreed to proceed.   Review of Systems    DEFERRED TO PCP  Cardiac Risk Factors include: advanced age (>49mn, >>79women);male gender;hypertension;dyslipidemia     Objective:    There were no vitals filed for this visit. There is no height or weight on file to calculate BMI.     08/26/2022   10:56 AM 08/04/2022    4:40 PM 09/01/2019    6:14 PM 01/08/2019   12:53 PM 01/08/2019    6:18 AM 01/05/2019    8:21 AM 02/11/2017    6:07 PM  Advanced Directives  Does Patient Have a Medical Advance Directive? No No No No No No No  Would patient like information on creating a medical advance directive? No - Guardian declined No - Patient declined  No - Patient declined No - Patient declined No - Patient declined No - Patient declined    Current Medications (verified) Outpatient Encounter Medications as of 08/26/2022  Medication Sig   amLODipine (NORVASC) 5 MG tablet TAKE 1 TABLET(5 MG) BY MOUTH DAILY. Please schedule office visit for more refills.   atorvastatin (LIPITOR) 20 MG tablet TAKE 1 TABLET(20 MG) BY MOUTH EVERY EVENING   irbesartan (AVAPRO) 150 MG tablet Take 1 tablet (150 mg total) by mouth daily.   No facility-administered encounter medications on file as of 08/26/2022.    Allergies (verified) Doxycycline and Penicillins   History: Past Medical History:  Diagnosis Date   Hypercholesteremia    Hypertension    Past Surgical History:  Procedure Laterality Date   INCISION AND DRAINAGE OF WOUND Right 05/25/2016   Procedure: IRRIGATION  AND DEBRIDEMENT WOUND RIGHT FOOT;  Surgeon: BEdrick Kins DPM;  [facility]  Service: Podiatry;  Laterality: Right;   LYMPHADENECTOMY Bilateral 01/08/2019   Procedure: LYMPHADENECTOMY, PELVIC BILATERAL;  Surgeon: BRaynelle Bring MD;  Location: [facility];  Service: Urology;  Laterality: Bilateral;   ROBOT ASSISTED LAPAROSCOPIC RADICAL PROSTATECTOMY N/A 01/08/2019   Procedure: XI ROBOTIC ASSISTED LAPAROSCOPIC RADICAL PROSTATECTOMY LEVEL 2;  Surgeon: BRaynelle Bring MD;  Location: [facility];  Service: Urology;  Laterality: N/A;   TIBIA IM NAIL INSERTION Right 02/12/2017   Procedure: INTRAMEDULLARY (IM) NAIL TIBIAL, RIGHT;  Surgeon: XLeandrew Koyanagi MD;  Location[facility]  Service: Orthopedics;  Laterality: Right;   Family History  Problem Relation Age of Onset   Cancer Mother    Heart disease Father    Hypertension Father    Social History   Socioeconomic History   Marital status: Married    Spouse name: Not on file   Number of children: Not on file   Years of education: Not on file   Highest education level: Not on file  Occupational History   Not on file  Tobacco Use   Smoking status: Never   Smokeless tobacco: Never  Vaping Use   Vaping Use: Never used  Substance and Sexual Activity   Alcohol use: No   Drug use: No   Sexual activity: Yes  Other Topics Concern   Not on file  Social History Narrative   Not on file  Social Determinants of Health   Financial Resource Strain: Low Risk  (08/26/2022)   Overall Financial Resource Strain (CARDIA)    Difficulty of Paying Living Expenses: Not hard at all  Food Insecurity: No Food Insecurity (08/26/2022)   Hunger Vital Sign    Worried About Running Out of Food in the Last Year: Never true    Ran Out of Food in the Last Year: Never true  Transportation Needs: No Transportation Needs (08/26/2022)   PRAPARE - Hydrologist (Medical): No    Lack of Transportation (Non-Medical): No  Physical Activity: Sufficiently  Active (08/26/2022)   Exercise Vital Sign    Days of Exercise per Week: 7 days    Minutes of Exercise per Session: 40 min  Stress: No Stress Concern Present (08/26/2022)   [place]    Feeling of Stress : Not at all  Social Connections: Moderately Integrated (08/26/2022)   Social Connection and Isolation Panel [NHANES]    Frequency of Communication with Friends and Family: Once a week    Frequency of Social Gatherings with Friends and Family: Twice a week    Attends Religious Services: More than 4 times per year    Active Member of [employer] or Organizations: No    Attends Music therapist: Never    Marital Status: Married    Tobacco Counseling Counseling given: Not Answered   Clinical Intake:  Pre-visit preparation completed: Yes  Pain : No/denies pain     Diabetes: No  How often do you need to have someone help you when you read instructions, pamphlets, or other written materials from your doctor or pharmacy?: 1 - Never What is the last grade level you completed in school?: 12 GRADE  Diabetic?NO   Interpreter Needed?: No  Information entered by :: [facility] Zalika Tieszen   Activities of Daily Living    08/26/2022   10:56 AM 08/04/2022    4:40 PM  In your present state of health, do you have any difficulty performing the following activities:  Hearing? 0 0  Vision? 0 0  Difficulty concentrating or making decisions? 0 0  Walking or climbing stairs? 0 0  Dressing or bathing? 0 0  Doing errands, shopping? 0 0  Preparing Food and eating ? N   Using the Toilet? N   In the past six months, have you accidently leaked urine? N   Do you have problems with loss of bowel control? N   Managing your Medications? N   Managing your Finances? N   Housekeeping or managing your Housekeeping? N     Patient Care Team: Idamae Schuller, MD as PCP - General (Internal Medicine)  Indicate any recent Medical Services you  may have received from other than [facility] providers in the past year (date may be approximate).     Assessment:   This is a routine wellness examination for [facility].  Hearing/Vision screen No results found.  Dietary issues and exercise activities discussed: Current Exercise Habits: Home exercise routine, Type of exercise: walking, Time (Minutes): 40, Frequency (Times/Week): 7, Weekly Exercise (Minutes/Week): 280   Goals Addressed   None   Depression Screen    08/26/2022   10:55 AM 07/01/2022    9:21 AM 11/12/2021    3:38 PM 03/27/2021    8:07 AM 03/03/2020   10:44 AM 11/07/2017    9:01 AM  PHQ 2/9 Scores  PHQ - 2 Score 0 0 0 0 0  0  PHQ- 9 Score  0        Fall Risk    08/26/2022   10:56 AM 08/04/2022    4:40 PM 08/04/2022    4:39 PM 07/01/2022    9:21 AM 11/12/2021    3:38 PM  Fall Risk   Falls in the past year? 0  0  0  Number falls in past yr: 0  0 0 0  Injury with Fall? 0  0 0 0  Risk for fall due to : No Fall Risks  No Fall Risks No Fall Risks   Follow up Education provided;Falls evaluation completed Falls evaluation completed;Falls prevention discussed  Falls evaluation completed     FALL RISK PREVENTION PERTAINING TO THE HOME:  Any stairs in or around the home? No  If so, are there any without handrails? No  Home free of loose throw rugs in walkways, pet beds, electrical cords, etc? YES  Adequate lighting in your home to reduce risk of falls? Yes   ASSISTIVE DEVICES UTILIZED TO PREVENT FALLS:  Life alert? No  Use of a cane, walker or w/c? No  Grab bars in the bathroom? No  Shower chair or bench in shower? No  Elevated toilet seat or a handicapped toilet? No   TIMED UP AND GO:  Was the test performed? No .  Length of time to ambulate 10 feet: N/A sec.     Cognitive Function:        08/26/2022   10:57 AM  6CIT Screen  What Year? 0 points  What month? 0 points  What time? 0 points  Count back from 20 0 points  Months in reverse 0 points  Repeat phrase 0  points  Total Score 0 points    Immunizations Immunization History  Administered Date(s) Administered   Fluad Quad(high Dose 65+) 03/03/2020, 03/27/2021, 07/01/2022   PFIZER(Purple Top)SARS-COV-2 Vaccination 08/19/2019, 09/18/2019   Pneumococcal Polysaccharide-23 01/09/2019   Td 07/10/2015    TDAP status: Up to date  Flu Vaccine status: Up to date  Pneumococcal vaccine status: Due, Education has been provided regarding the importance of this vaccine. Advised may receive this vaccine at local pharmacy or Health Dept. Aware to provide a copy of the vaccination record if obtained from local pharmacy or Health Dept. Verbalized acceptance and understanding.  Covid-19 vaccine status: Completed vaccines  Qualifies for Shingles Vaccine? Yes   Zostavax completed No   Shingrix Completed?: No.    Education has been provided regarding the importance of this vaccine. Patient has been advised to call insurance company to determine out of pocket expense if they have not yet received this vaccine. Advised may also receive vaccine at local pharmacy or Health Dept. Verbalized acceptance and understanding.  Screening Tests Health Maintenance  Topic Date Due   Zoster Vaccines- Shingrix (1 of 2) Never done   Pneumonia Vaccine 44+ Years old (2 of 2 - PCV) 01/09/2020   COLONOSCOPY (Pts 45-38yr Insurance coverage will need to be confirmed)  08/30/2022 (Originally 06/15/1998)   Medicare Annual Wellness (AWV)  08/26/2023   DTaP/Tdap/Td (2 - Tdap) 07/09/2025   INFLUENZA VACCINE  Completed   Hepatitis C Screening  Completed   HPV VACCINES  Aged Out   COVID-19 Vaccine  Discontinued    Health Maintenance  Health Maintenance Due  Topic Date Due   Zoster Vaccines- Shingrix (1 of 2) Never done   Pneumonia Vaccine 70 Years old (2 of 2 - PCV) 01/09/2020  Lung Cancer Screening: (Low Dose CT Chest recommended if Age 70-80 years, 30 pack-year currently smoking OR have quit w/in 15years.) does not  qualify.   Lung Cancer Screening Referral: DEFERRED TO PCP   Additional Screening:  Hepatitis C Screening: does qualify; Completed 07/10/2015  Vision Screening: Recommended annual ophthalmology exams for early detection of glaucoma and other disorders of the eye. Is the patient up to date with their annual eye exam?  Yes  Who is the provider or what is the name of the office in which the patient attends annual eye exams? [facility]  If pt is not established with a provider, would they like to be referred to a provider to establish care? No .   Dental Screening: Recommended annual dental exams for proper oral hygiene  Community Resource Referral / Chronic Care Management: CRR required this visit?  No   CCM required this visit?  No      Plan:     I have personally reviewed and noted the following in the patient's chart:   Medical and social history Use of alcohol, tobacco or illicit drugs  Current medications and supplements including opioid prescriptions. Patient is not currently taking opioid prescriptions. Functional ability and status Nutritional status Physical activity Advanced directives List of other physicians Hospitalizations, surgeries, and ER visits in previous 12 months Vitals Screenings to include cognitive, depression, and falls Referrals and appointments  In addition, I have reviewed and discussed with patient certain preventive protocols, quality metrics, and best practice recommendations. A written personalized care plan for preventive services as well as general preventive health recommendations were provided to patient.     Judyann Munson, CMA   08/26/2022   Nurse Notes: NON FACE TO FACE    Mr. Braccia , Thank you for taking time to come for your Medicare Wellness Visit. I appreciate your ongoing commitment to your health goals. Please review the following plan we discussed and let me know if I can assist you in the future.   These are the  goals we discussed:  Goals   None     This is a list of the screening recommended for you and due dates:  Health Maintenance  Topic Date Due   Zoster (Shingles) Vaccine (1 of 2) Never done   Pneumonia Vaccine (2 of 2 - PCV) 01/09/2020   Colon Cancer Screening  08/30/2022*   Medicare Annual Wellness Visit  08/26/2023   DTaP/Tdap/Td vaccine (2 - Tdap) 07/09/2025   Flu Shot  Completed   Hepatitis C Screening: USPSTF Recommendation to screen - Ages 18-79 yo.  Completed   HPV Vaccine  Aged Out   COVID-19 Vaccine  Discontinued  *Topic was postponed. The date shown is not the original due date.

## 2022-08-26 NOTE — Patient Instructions (Signed)
Health Maintenance, Male Adopting a healthy lifestyle and getting preventive care are important in promoting health and wellness. Ask your health care provider about: The right schedule for you to have regular tests and exams. Things you can do on your own to prevent diseases and keep yourself healthy. What should I know about diet, weight, and exercise? Eat a healthy diet  Eat a diet that includes plenty of vegetables, fruits, low-fat dairy products, and lean protein. Do not eat a lot of foods that are high in solid fats, added sugars, or sodium. Maintain a healthy weight Body mass index (BMI) is a measurement that can be used to identify possible weight problems. It estimates body fat based on height and weight. Your health care provider can help determine your BMI and help you achieve or maintain a healthy weight. Get regular exercise Get regular exercise. This is one of the most important things you can do for your health. Most adults should: Exercise for at least 150 minutes each week. The exercise should increase your heart rate and make you sweat (moderate-intensity exercise). Do strengthening exercises at least twice a week. This is in addition to the moderate-intensity exercise. Spend less time sitting. Even light physical activity can be beneficial. Watch cholesterol and blood lipids Have your blood tested for lipids and cholesterol at 70 years of age, then have this test every 5 years. You may need to have your cholesterol levels checked more often if: Your lipid or cholesterol levels are high. You are older than 70 years of age. You are at high risk for heart disease. What should I know about cancer screening? Many types of cancers can be detected early and may often be prevented. Depending on your health history and family history, you may need to have cancer screening at various ages. This may include screening for: Colorectal cancer. Prostate cancer. Skin cancer. Lung  cancer. What should I know about heart disease, diabetes, and high blood pressure? Blood pressure and heart disease High blood pressure causes heart disease and increases the risk of stroke. This is more likely to develop in people who have high blood pressure readings or are overweight. Talk with your health care provider about your target blood pressure readings. Have your blood pressure checked: Every 3-5 years if you are 18-39 years of age. Every year if you are 40 years old or older. If you are between the ages of 65 and 75 and are a current or former smoker, ask your health care provider if you should have a one-time screening for abdominal aortic aneurysm (AAA). Diabetes Have regular diabetes screenings. This checks your fasting blood sugar level. Have the screening done: Once every three years after age 45 if you are at a normal weight and have a low risk for diabetes. More often and at a younger age if you are overweight or have a high risk for diabetes. What should I know about preventing infection? Hepatitis B If you have a higher risk for hepatitis B, you should be screened for this virus. Talk with your health care provider to find out if you are at risk for hepatitis B infection. Hepatitis C Blood testing is recommended for: Everyone born from 1945 through 1965. Anyone with known risk factors for hepatitis C. Sexually transmitted infections (STIs) You should be screened each year for STIs, including gonorrhea and chlamydia, if: You are sexually active and are younger than 70 years of age. You are older than 70 years of age and your   health care provider tells you that you are at risk for this type of infection. Your sexual activity has changed since you were last screened, and you are at increased risk for chlamydia or gonorrhea. Ask your health care provider if you are at risk. Ask your health care provider about whether you are at high risk for HIV. Your health care provider  may recommend a prescription medicine to help prevent HIV infection. If you choose to take medicine to prevent HIV, you should first get tested for HIV. You should then be tested every 3 months for as long as you are taking the medicine. Follow these instructions at home: Alcohol use Do not drink alcohol if your health care provider tells you not to drink. If you drink alcohol: Limit how much you have to 0-2 drinks a day. Know how much alcohol is in your drink. In the [place] one drink equals one 12 oz bottle of beer (355 mL), one 5 oz glass of wine (148 mL), or one 1 oz glass of hard liquor (44 mL). Lifestyle Do not use any products that contain nicotine or tobacco. These products include cigarettes, chewing tobacco, and vaping devices, such as e-cigarettes. If you need help quitting, ask your health care provider. Do not use street drugs. Do not share needles. Ask your health care provider for help if you need support or information about quitting drugs. General instructions Schedule regular health, dental, and eye exams. Stay current with your vaccines. Tell your health care provider if: You often feel depressed. You have ever been abused or do not feel safe at home. Summary Adopting a healthy lifestyle and getting preventive care are important in promoting health and wellness. Follow your health care provider's instructions about healthy diet, exercising, and getting tested or screened for diseases. Follow your health care provider's instructions on monitoring your cholesterol and blood pressure. This information is not intended to replace advice given to you by your health care provider. Make sure you discuss any questions you have with your health care provider. Document Revised: 10/27/2020 Document Reviewed: 10/27/2020 Elsevier Patient Education  2023 [employer] Inc[employer]  

## 2022-09-06 NOTE — Progress Notes (Signed)
Internal Medicine Clinic Attending  Case and documentation of Dr. Khan  reviewed.  I reviewed the AWV findings.  I agree with the assessment, diagnosis, and plan of care documented in the AWV note.     

## 2022-10-04 ENCOUNTER — Other Ambulatory Visit: Payer: Self-pay

## 2022-10-04 MED ORDER — IRBESARTAN 150 MG PO TABS: 150.0000 mg | ORAL_TABLET | Freq: Every day | ORAL | 0 refills | Status: DC

## 2022-10-05 ENCOUNTER — Other Ambulatory Visit: Payer: Self-pay | Admitting: Internal Medicine

## 2022-11-03 ENCOUNTER — Other Ambulatory Visit: Payer: Self-pay | Admitting: Internal Medicine

## 2022-12-06 ENCOUNTER — Other Ambulatory Visit: Payer: Self-pay

## 2022-12-06 MED ORDER — IRBESARTAN 150 MG PO TABS: 150.0000 mg | ORAL_TABLET | Freq: Every day | ORAL | 11 refills | Status: DC

## 2022-12-30 ENCOUNTER — Ambulatory Visit: Payer: Medicare Other | Admitting: Nurse Practitioner

## 2023-03-17 ENCOUNTER — Emergency Department (HOSPITAL_COMMUNITY)
Admission: EM | Admit: 2023-03-17 | Discharge: 2023-03-17 | Disposition: A | Discharge status: Home / Self Care | Payer: Medicare Other | Attending: Emergency Medicine | Admitting: Emergency Medicine

## 2023-03-17 ENCOUNTER — Encounter (HOSPITAL_COMMUNITY): Payer: Self-pay

## 2023-03-17 ENCOUNTER — Other Ambulatory Visit: Payer: Self-pay

## 2023-03-17 ENCOUNTER — Emergency Department (HOSPITAL_COMMUNITY): Payer: Medicare Other

## 2023-03-17 ENCOUNTER — Telehealth: Payer: Self-pay | Admitting: *Deleted

## 2023-03-17 DIAGNOSIS — R059 Cough, unspecified: Secondary | ICD-10-CM | POA: Diagnosis not present

## 2023-03-17 DIAGNOSIS — U071 COVID-19: Secondary | ICD-10-CM | POA: Insufficient documentation

## 2023-03-17 DIAGNOSIS — Z79899 Other long term (current) drug therapy: Secondary | ICD-10-CM | POA: Insufficient documentation

## 2023-03-17 DIAGNOSIS — I10 Essential (primary) hypertension: Secondary | ICD-10-CM | POA: Diagnosis not present

## 2023-03-17 LAB — I-STAT CREATININE, ED: Creatinine, Ser: 1.6 mg/dL — ABNORMAL HIGH (ref 0.61–1.24)

## 2023-03-17 LAB — RESP PANEL BY RT-PCR (RSV, FLU A&B, COVID)  RVPGX2
Influenza A by PCR: NEGATIVE
Influenza B by PCR: NEGATIVE
Resp Syncytial Virus by PCR: NEGATIVE
SARS Coronavirus 2 by RT PCR: POSITIVE — AB

## 2023-03-17 MED ORDER — PAXLOVID (150/100) 10 X 150 MG & 10 X 100MG PO TBPK
2.0000 | ORAL_TABLET | Freq: Two times a day (BID) | ORAL | 0 refills | Status: AC
Start: 2023-03-17 — End: 2023-03-22

## 2023-03-17 NOTE — Telephone Encounter (Signed)
Call from patient and his wife.  Patient has been having sneezing, runny nose and dizzy for a couple of days.   Has a non-productive cough.  Temperature was 98.3.  No Nausea, Vomiting or Diarrhea presently.  Patient has not done a Covid Test.  Wife had something similar last week but did not do a Covid Test.  Just did OTC meds and feels a little better.  Patient wants an appointment.  No available appointments today.  Has an appontment on tomorrow.

## 2023-03-17 NOTE — Discharge Instructions (Signed)
It was a pleasure taking part in your care today.  As discussed, you have COVID-19.  I am starting on a medication called Paxlovid.  Please quarantine yourself at home, please review the attached guidelines concerning quarantine and isolation.  You may take Tylenol for headaches and fevers at home.  You may take Zicam to help shorten duration of symptoms.  Please continue to hydrate yourself.

## 2023-03-17 NOTE — ED Triage Notes (Signed)
Patient arrived with cough, productive. Endorses sneezing and runny nose.States wife was sick recently with a virus. States feels intermittent dizzy spells "tiny bit". Denies any other symptoms.

## 2023-03-17 NOTE — ED Provider Notes (Addendum)
[place] EMERGENCY DEPARTMENT AT [facility] Provider Note   CSN: [id number] Arrival date & time: 03/17/23  1145     History  Chief Complaint  Patient presents with   Cough    [name] [name] is a 72 y.o. male with medical history of hypertension, hypercholesterolemia.  Patient presents to ED for evaluation of URI symptoms.  Patient reports that beginning yesterday he developed cough, body aches and chills, runny nose and congestion.  States that his wife was recently sick with similar symptoms.  She did not test, he is unsure what she had.  He denies medications prior to arrival.  He denies chest pain, shortness of breath, fevers, sore throat.  He is endorsing dizziness 1 time yesterday.   Cough Associated symptoms: chills and rhinorrhea   Associated symptoms: no chest pain, no fever, no shortness of breath and no sore throat        Home Medications Prior to Admission medications   Medication Sig Start Date End Date Taking? Authorizing Provider  nirmatrelvir & ritonavir (PAXLOVID, 150/100,) 10 x 150 MG & 10 x 100MG  TBPK Take 2 tablets by mouth 2 (two) times daily for 5 days. 03/17/23 03/22/23 Yes Al Decant, PA-C  amLODipine (NORVASC) 5 MG tablet TAKE 1 TABLET(5 MG) BY MOUTH DAILY. Please schedule office visit for more refills. 08/12/22   Gwenevere Abbot, MD  atorvastatin (LIPITOR) 20 MG tablet TAKE 1 TABLET(20 MG) BY MOUTH EVERY EVENING 08/12/22   Gwenevere Abbot, MD  irbesartan (AVAPRO) 150 MG tablet Take 1 tablet (150 mg total) by mouth daily. 12/06/22 12/06/23  Gwenevere Abbot, MD      Allergies    Doxycycline and Penicillins    Review of Systems   Review of Systems  Constitutional:  Positive for chills. Negative for fever.  HENT:  Positive for congestion and rhinorrhea. Negative for sore throat.   Respiratory:  Positive for cough. Negative for shortness of breath.   Cardiovascular:  Negative for chest pain.  Neurological:  Positive for dizziness.  All other  systems reviewed and are negative.   Physical Exam Updated Vital Signs BP 122/88 (BP Location: Left Arm)   Pulse 65   Temp 98.2 F (36.8 C) (Oral)   Resp 16   Ht 6\' 1"  (1.854 m)   Wt 108.4 kg   SpO2 98%   BMI 31.53 kg/m  Physical Exam Vitals and nursing note reviewed.  Constitutional:      General: He is not in acute distress.    Appearance: Normal appearance. He is not ill-appearing, toxic-appearing or diaphoretic.  HENT:     Head: Normocephalic and atraumatic.     Nose: Nose normal.     Mouth/Throat:     Mouth: Mucous membranes are moist.     Pharynx: Oropharynx is clear.  Eyes:     Extraocular Movements: Extraocular movements intact.     Conjunctiva/sclera: Conjunctivae normal.     Pupils: Pupils are equal, round, and reactive to light.  Cardiovascular:     Rate and Rhythm: Normal rate and regular rhythm.  Pulmonary:     Effort: Pulmonary effort is normal.     Breath sounds: Normal breath sounds. No wheezing.  Abdominal:     General: Abdomen is flat. Bowel sounds are normal.     Palpations: Abdomen is soft.     Tenderness: There is no abdominal tenderness.  Musculoskeletal:     Cervical back: Normal range of motion and neck supple. No tenderness.  Skin:  General: Skin is warm and dry.     Capillary Refill: Capillary refill takes less than 2 seconds.  Neurological:     Mental Status: He is alert and oriented to person, place, and time.     ED Results / Procedures / Treatments   Labs (all labs ordered are listed, but only abnormal results are displayed) Labs Reviewed  RESP PANEL BY RT-PCR (RSV, FLU A&B, COVID)  RVPGX2 - Abnormal; Notable for the following components:      Result Value   SARS Coronavirus 2 by RT PCR POSITIVE (*)    All other components within normal limits  I-STAT CREATININE, ED - Abnormal; Notable for the following components:   Creatinine, Ser 1.60 (*)    All other components within normal limits    EKG None  Radiology No results  found.  Procedures Procedures   Medications Ordered in ED Medications - No data to display  ED Course/ Medical Decision Making/ A&P  Medical Decision Making Amount and/or Complexity of Data Reviewed Radiology: ordered.  Risk Prescription drug management.   72 year old male presents to ED for evaluation.  Please see HPI for further details.  On examination patient is afebrile and nontachycardic.  Lung sounds are bilaterally, nonhypoxic.  Abdomen is soft and compressible throughout.  Neurological examination is at baseline.  Patient is COVID-positive.  Patient ambulated, not hypoxic.  Will start patient on Paxlovid.  Patient creatinine elevated so we will start him on renally reduced dose.  Advised him to treat symptoms at home conservatively.  Have encouraged him to isolate and quarantine.  He voiced understanding.  He will follow-up with his PCP for further management and care.  He will return for any new or worsening symptoms.   Final Clinical Impression(s) / ED Diagnoses Final diagnoses:  COVID-19    Rx / DC Orders ED Discharge Orders          Ordered    nirmatrelvir & ritonavir (PAXLOVID, 150/100,) 10 x 150 MG & 10 x 100MG  TBPK  2 times daily        03/17/23 1506                  Al Decant, [place] 03/17/23 1515    Linwood Dibbles, MD 03/18/23 [phone]

## 2023-03-18 ENCOUNTER — Telehealth: Payer: Medicare Other | Admitting: Student

## 2023-03-27 ENCOUNTER — Emergency Department (HOSPITAL_COMMUNITY)
Admission: EM | Admit: 2023-03-27 | Discharge: 2023-03-27 | Disposition: A | Discharge status: Home / Self Care | Payer: Medicare Other | Attending: Emergency Medicine | Admitting: Emergency Medicine

## 2023-03-27 ENCOUNTER — Encounter (HOSPITAL_COMMUNITY): Payer: Self-pay | Admitting: Emergency Medicine

## 2023-03-27 ENCOUNTER — Emergency Department (HOSPITAL_COMMUNITY): Payer: Medicare Other

## 2023-03-27 DIAGNOSIS — J209 Acute bronchitis, unspecified: Secondary | ICD-10-CM | POA: Insufficient documentation

## 2023-03-27 DIAGNOSIS — I10 Essential (primary) hypertension: Secondary | ICD-10-CM | POA: Diagnosis not present

## 2023-03-27 DIAGNOSIS — R079 Chest pain, unspecified: Secondary | ICD-10-CM | POA: Diagnosis not present

## 2023-03-27 DIAGNOSIS — R739 Hyperglycemia, unspecified: Secondary | ICD-10-CM

## 2023-03-27 DIAGNOSIS — E876 Hypokalemia: Secondary | ICD-10-CM | POA: Insufficient documentation

## 2023-03-27 DIAGNOSIS — I700 Atherosclerosis of aorta: Secondary | ICD-10-CM | POA: Diagnosis not present

## 2023-03-27 DIAGNOSIS — R059 Cough, unspecified: Secondary | ICD-10-CM | POA: Diagnosis not present

## 2023-03-27 DIAGNOSIS — R7309 Other abnormal glucose: Secondary | ICD-10-CM | POA: Diagnosis not present

## 2023-03-27 DIAGNOSIS — D649 Anemia, unspecified: Secondary | ICD-10-CM | POA: Diagnosis not present

## 2023-03-27 DIAGNOSIS — Z79899 Other long term (current) drug therapy: Secondary | ICD-10-CM | POA: Diagnosis not present

## 2023-03-27 LAB — CBC WITH DIFFERENTIAL/PLATELET
Abs Immature Granulocytes: 0.02 10*3/uL (ref 0.00–0.07)
Basophils Absolute: 0 10*3/uL (ref 0.0–0.1)
Basophils Relative: 0 %
Eosinophils Absolute: 0.1 10*3/uL (ref 0.0–0.5)
Eosinophils Relative: 2 %
HCT: 35.6 % — ABNORMAL LOW (ref 39.0–52.0)
Hemoglobin: 11.8 g/dL — ABNORMAL LOW (ref 13.0–17.0)
Immature Granulocytes: 0 %
Lymphocytes Relative: 30 %
Lymphs Abs: 2 10*3/uL (ref 0.7–4.0)
MCH: 29.9 pg (ref 26.0–34.0)
MCHC: 33.1 g/dL (ref 30.0–36.0)
MCV: 90.4 fL (ref 80.0–100.0)
Monocytes Absolute: 0.6 10*3/uL (ref 0.1–1.0)
Monocytes Relative: 9 %
Neutro Abs: 4 10*3/uL (ref 1.7–7.7)
Neutrophils Relative %: 59 %
Platelets: 139 10*3/uL — ABNORMAL LOW (ref 150–400)
RBC: 3.94 MIL/uL — ABNORMAL LOW (ref 4.22–5.81)
RDW: 13.2 % (ref 11.5–15.5)
WBC: 6.7 10*3/uL (ref 4.0–10.5)
nRBC: 0 % (ref 0.0–0.2)

## 2023-03-27 LAB — COMPREHENSIVE METABOLIC PANEL
ALT: 36 U/L (ref 0–44)
AST: 32 U/L (ref 15–41)
Albumin: 3.6 g/dL (ref 3.5–5.0)
Alkaline Phosphatase: 68 U/L (ref 38–126)
Anion gap: 14 (ref 5–15)
BUN: 23 mg/dL (ref 8–23)
CO2: 22 mmol/L (ref 22–32)
Calcium: 9.1 mg/dL (ref 8.9–10.3)
Chloride: 105 mmol/L (ref 98–111)
Creatinine, Ser: 1.34 mg/dL — ABNORMAL HIGH (ref 0.61–1.24)
GFR, Estimated: 57 mL/min — ABNORMAL LOW (ref >60)
Glucose, Bld: 105 mg/dL — ABNORMAL HIGH (ref 70–99)
Potassium: 3.2 mmol/L — ABNORMAL LOW (ref 3.5–5.1)
Sodium: 141 mmol/L (ref 135–145)
Total Bilirubin: 0.3 mg/dL (ref 0.3–1.2)
Total Protein: 6.8 g/dL (ref 6.5–8.1)

## 2023-03-27 LAB — TROPONIN I (HIGH SENSITIVITY)
Troponin I (High Sensitivity): 12 ng/L (ref <18)
Troponin I (High Sensitivity): 10 ng/L (ref <18)

## 2023-03-27 MED ORDER — ALBUTEROL SULFATE HFA 108 (90 BASE) MCG/ACT IN AERS: 2.0000 | INHALATION_SPRAY | RESPIRATORY_TRACT | 0 refills | Status: DC | PRN

## 2023-03-27 MED ORDER — PREDNISONE 50 MG PO TABS: 50.0000 mg | ORAL_TABLET | Freq: Every day | ORAL | 0 refills | Status: DC

## 2023-03-27 MED ORDER — PREDNISONE 20 MG PO TABS
60.0000 mg | ORAL_TABLET | Freq: Once | ORAL | Status: AC
  Administered 2023-03-27: 60 mg via ORAL
  Filled 2023-03-27: qty 3

## 2023-03-27 MED ORDER — POTASSIUM CHLORIDE CRYS ER 20 MEQ PO TBCR
40.0000 meq | EXTENDED_RELEASE_TABLET | Freq: Once | ORAL | Status: AC
  Administered 2023-03-27: 40 meq via ORAL
  Filled 2023-03-27: qty 2

## 2023-03-27 MED ORDER — IPRATROPIUM-ALBUTEROL 0.5-2.5 (3) MG/3ML IN SOLN
3.0000 mL | Freq: Once | RESPIRATORY_TRACT | Status: AC
  Administered 2023-03-27: 3 mL via RESPIRATORY_TRACT
  Filled 2023-03-27: qty 3

## 2023-03-27 MED ORDER — POTASSIUM CHLORIDE CRYS ER 20 MEQ PO TBCR: 20.0000 meq | EXTENDED_RELEASE_TABLET | Freq: Two times a day (BID) | ORAL | 0 refills | Status: DC

## 2023-03-27 NOTE — ED Triage Notes (Signed)
Pt states cough started yesterday. He is having chest pain with the cough. Has runny nose.

## 2023-03-27 NOTE — ED Provider Notes (Signed)
[place] EMERGENCY DEPARTMENT AT [facility] Provider Note   CSN: [id number] Arrival date & time: 03/27/23  0142     History  Chief Complaint  Patient presents with   Cough    [name] [name] is a 72 y.o. male.  The history is provided by the patient.  Cough He has history of hypertension, hyperlipidemia and was recently treated for COVID-19 with Paxlovid and comes in with 2-day history of cough productive of yellow sputum.  There is also been some rhinorrhea.  He endorses some soreness in his chest when he coughs.  He denies dyspnea.  He denies fever, chills, sweats.  He denies nausea, vomiting, diarrhea.  He denies arthralgias or myalgias.   Home Medications Prior to Admission medications   Medication Sig Start Date End Date Taking? Authorizing Provider  amLODipine (NORVASC) 5 MG tablet TAKE 1 TABLET(5 MG) BY MOUTH DAILY. Please schedule office visit for more refills. 08/12/22   Gwenevere Abbot, MD  atorvastatin (LIPITOR) 20 MG tablet TAKE 1 TABLET(20 MG) BY MOUTH EVERY EVENING 08/12/22   Gwenevere Abbot, MD  irbesartan (AVAPRO) 150 MG tablet Take 1 tablet (150 mg total) by mouth daily. 12/06/22 12/06/23  Gwenevere Abbot, MD      Allergies    Doxycycline and Penicillins    Review of Systems   Review of Systems  Respiratory:  Positive for cough.   All other systems reviewed and are negative.   Physical Exam Updated Vital Signs BP (!) 142/80 (BP Location: Right Arm)   Pulse (!) 52   Temp 98.6 F (37 C)   Resp 15   SpO2 96%  Physical Exam Vitals and nursing note reviewed.   72 year old male, resting comfortably and in no acute distress. Vital signs are significant for borderline elevated blood pressure and slightly slow heart rate. Oxygen saturation is 96%, which is normal. Head is normocephalic and atraumatic. PERRLA, left eye deviates medially but remainder of extraocular movements are intact. Oropharynx is clear.  There is no sinus tenderness. Neck is nontender and  supple without adenopathy or JVD. Back is nontender and there is no CVA tenderness. Lungs are clear without rales, wheezes, or rhonchi.  Prolonged exhalation phase is noted. Chest is nontender. Heart has regular rate and rhythm without murmur. Abdomen is soft, flat, nontender. Extremities have no cyanosis or edema, full range of motion is present. Skin is warm and dry without rash. Neurologic: Mental status is normal, cranial nerves are intact, moves all extremities equally.  ED Results / Procedures / Treatments   Labs (all labs ordered are listed, but only abnormal results are displayed) Labs Reviewed  COMPREHENSIVE METABOLIC PANEL - Abnormal; Notable for the following components:      Result Value   Potassium 3.2 (*)    Glucose, Bld 105 (*)    Creatinine, Ser 1.34 (*)    GFR, Estimated 57 (*)    All other components within normal limits  CBC WITH DIFFERENTIAL/PLATELET - Abnormal; Notable for the following components:   RBC 3.94 (*)    Hemoglobin 11.8 (*)    HCT 35.6 (*)    Platelets 139 (*)    All other components within normal limits  TROPONIN I (HIGH SENSITIVITY)  TROPONIN I (HIGH SENSITIVITY)    EKG EKG Interpretation Date/Time:  Sunday March 27 2023 02:00:32 EDT Ventricular Rate:  57 PR Interval:  226 QRS Duration:  94 QT Interval:  428 QTC Calculation: 416 R Axis:   -36  Text Interpretation: Sinus  bradycardia with 1st degree A-V block with Premature supraventricular complexes Left axis deviation Minimal voltage criteria for LVH, may be normal variant ( R in aVL ) ST & T wave abnormality, consider inferior ischemia Abnormal ECG When compared with ECG of 05-Jan-2019 08:13, Premature atrial complexes are now present Confirmed by Dione Booze ([id number]) on 03/27/2023 2:59:24 AM  Radiology DG Chest 2 View  Result Date: 03/27/2023 CLINICAL DATA:  Chest pain and cough EXAM: CHEST - 2 VIEW COMPARISON:  03/17/2023 FINDINGS: Stable cardiomediastinal silhouette. Aortic  atherosclerotic calcification. No focal consolidation, pleural effusion, or pneumothorax. No displaced rib fractures. IMPRESSION: No active cardiopulmonary disease. Electronically Signed   By: Minerva Fester M.D.   On: 03/27/2023 02:30    Procedures Procedures    Medications Ordered in ED Medications  potassium chloride SA (KLOR-CON M) CR tablet 40 mEq (has no administration in time range)    ED Course/ Medical Decision Making/ A&P                                 Medical Decision Making Amount and/or Complexity of Data Reviewed Labs: ordered. Radiology: ordered.  Risk Prescription drug management.   Cough which may be residual infection with COVID-19, consider pneumonia, consider bronchitis with other viral infection.  I have reviewed his past records and note ED visit on 03/17/2023 with diagnosis of COVID-19 and given prescription for Paxlovid.  Chest x-ray shows no evidence of pneumonia.  Have independently viewed the images, and agree with the radiologist's interpretation.  I have reviewed his laboratory tests, and my interpretation is hypokalemia, mildly elevated random glucose which will need to be followed as an outpatient, mild renal insufficiency improved over most recent value, mild thrombocytopenia which is actually improved over baseline, mild anemia which is new compared with 08/04/2022 but is in a range that he had been at in 2018.  I have ordered a dose of oral potassium and I have ordered a nebulizer treatment with albuterol and ipratropium.  Following nebulizer treatment, cough is slightly improved and he felt like he was breathing easier.  On reexam, there is improved airflow and no longer a prolonged exhalation phase.  I have ordered a dose of prednisone and I am discharging him with prescriptions for albuterol inhaler and prednisone as well as oral potassium.  Return precautions discussed.  Final Clinical Impression(s) / ED Diagnoses Final diagnoses:  Acute bronchitis,  unspecified organism  Hypokalemia  Normochromic normocytic anemia  Elevated random blood glucose level    Rx / DC Orders ED Discharge Orders          Ordered    predniSONE (DELTASONE) 50 MG tablet  Daily        03/27/23 0807    albuterol (VENTOLIN HFA) 108 (90 Base) MCG/ACT inhaler  Every 4 hours PRN        03/27/23 0807    potassium chloride SA (KLOR-CON M) 20 MEQ tablet  2 times daily        03/27/23 0811              Dione Booze, MD 03/27/23 [phone]

## 2023-03-27 NOTE — Discharge Instructions (Addendum)
Drink plenty of fluids.  Use the inhaler as needed to help with your breathing and coughing.  Return to the emergency department if you start running a fever or if the cough starts getting significantly worse.

## 2023-03-29 ENCOUNTER — Telehealth: Payer: Self-pay | Admitting: *Deleted

## 2023-03-29 MED ORDER — BENZONATATE 100 MG PO CAPS: 100.0000 mg | ORAL_CAPSULE | Freq: Three times a day (TID) | ORAL | 0 refills | Status: DC | PRN

## 2023-03-29 NOTE — Telephone Encounter (Signed)
Call from patient's wife states patient went to the Urgent and has Bronchitis.  Has been doing a lot of coughing.  Taking Robitussin that does not help.  Wants to know what to do next for the patient.  Was given Pednisone and Albuterol.  No fever at this time.  Please call back.

## 2023-03-29 NOTE — Telephone Encounter (Signed)
Called patient and discussed symptoms with wife and patient.  He has been having a bothersome cough since being seen in the emergency department for acute bronchitis and overall improvement with treatment with albuterol and prednisone.  He has been having a cough that is primarily worse at night and has been productive of clear or white sputum which is changed from yellow sputum.  He has not had any dyspnea or fevers.  He has 3 days left of prednisone 50 mg and has not been using the albuterol inhaler very much due to the lack of dyspnea.  He is also taking Robitussin DM without significant benefit for his nighttime cough.  Discussed the likelihood of this cough lingering for weeks but due to it interrupting his sleep we will try Jerilynn Som and he will keep his follow-up appointment on 03/31/2023.  Rocky Morel, DO Internal Medicine Resident, PGY-2 Pager# [phone] Please contact the on call pager after 5 pm and on weekends at [phone].

## 2023-03-31 ENCOUNTER — Telehealth: Payer: Self-pay

## 2023-03-31 ENCOUNTER — Telehealth: Payer: Self-pay | Admitting: *Deleted

## 2023-03-31 ENCOUNTER — Ambulatory Visit: Payer: Medicare Other | Admitting: Student

## 2023-03-31 DIAGNOSIS — J209 Acute bronchitis, unspecified: Secondary | ICD-10-CM | POA: Diagnosis not present

## 2023-03-31 HISTORY — DX: Acute bronchitis, unspecified: J20.9

## 2023-03-31 MED ORDER — GUAIFENESIN-CODEINE 100-10 MG/5ML PO SOLN
5.0000 mL | ORAL | 0 refills | Status: DC | PRN
Start: 2023-03-31 — End: 2023-04-01

## 2023-03-31 NOTE — Telephone Encounter (Signed)
[facility] on [address] [phone] called regarding this patient's cough syrup and need instructions on how he should take it.

## 2023-03-31 NOTE — Telephone Encounter (Signed)
Incoming fax from pharmacy Medication:Codeine-Guaifen Message to prescriber: "plan requires specific directions to process the prescription".

## 2023-03-31 NOTE — Telephone Encounter (Signed)
Pharmacy needs to know how often as needed( ie once a day as needed).. Send new rx. Thanks

## 2023-03-31 NOTE — Assessment & Plan Note (Addendum)
Patient reporting cough for 1-2 weeks.  Cough is worse at night, with occasional white sputum.  Otherwise he denies any fevers, shortness of breath or chest pain.   Of note, patient was diagnosed with COVID by PCR on 9/26, and received treatment with Paxlovid.  Overall, he feels he is not worsening, cough is improving.  He was prescribed Tessalon Perles to help with the cough, it only helped somewhat.  Will prescribe Robitussin with codeine to help with the cough.  Side effects including drowsiness was also discussed with the patient.  -5 mL of Robitussin with codeine 3 times as needed.

## 2023-03-31 NOTE — Progress Notes (Signed)
   CC: follow up on bronchitis  This is a telephone encounter between [name] [name] and [name] [name] on 03/31/2023 for bronchitis. The visit was conducted with the patient located at home and [name] [name] at [facility]. The patient's identity was confirmed using their DOB and current address. The patient has consented to being evaluated through a telephone encounter and understands the associated risks (an examination cannot be done and the patient may need to come in for an appointment) / benefits (allows the patient to remain at home, decreasing exposure to coronavirus). I personally spent 15 minutes on medical discussion.   HPI:  [name] is a 72 y.o. with PMH as below.   Please see A&P for assessment of the patient's acute and chronic medical conditions.   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   Need for vaccination 07/01/2022   Prediabetes 07/01/2022   Encounter for general adult medical examination with abnormal findings 07/01/2022   Thrombocytopenia (HCC) 11/12/2021   Prostate cancer (HCC) 01/08/2019   Nondisplaced fracture of medial malleolus of right tibia, initial encounter for closed fracture 02/12/2017   Tibia fracture 02/11/2017   Essential hypertension 05/25/2016   Hyperlipidemia 08/02/2015   CKD (chronic kidney disease), stage III (HCC) 07/31/2015   Legally blind in right eye, 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  Negative for fevers, shortness of breath or chest pain.   Assessment & Plan:   Acute bronchitis Patient reporting cough for 1-2 weeks.  Cough is worse at night, with occasional white sputum.  Otherwise he denies any fevers, shortness of breath or chest pain.   Of note, patient was diagnosed with COVID by PCR on 9/26, and received treatment with  Paxlovid.  Overall, he feels he is not worsening, cough is improving.  He was prescribed Tessalon Perles to help with the cough, it only helped somewhat.  Will prescribe Robitussin with codeine to help with the cough.  Side effects including drowsiness was also discussed with the patient.  -5 mL of Robitussin with codeine 3 times as needed.    Patient seen with Dr. Mayra Reel, MD  Internal Medicine Resident

## 2023-04-01 MED ORDER — GUAIFENESIN-CODEINE 100-10 MG/5ML PO SOLN
5.0000 mL | Freq: Two times a day (BID) | ORAL | 0 refills | Status: AC | PRN
Start: 2023-04-01 — End: 2023-04-06

## 2023-04-04 NOTE — Progress Notes (Signed)
Internal Medicine Clinic Attending  Case discussed with the resident at the time of the visit.  We reviewed the resident's history and exam and pertinent patient test results.  I agree with the assessment, diagnosis, and plan of care documented in the resident's note.

## 2023-04-09 ENCOUNTER — Other Ambulatory Visit: Payer: Self-pay | Admitting: Internal Medicine

## 2023-04-12 ENCOUNTER — Encounter (HOSPITAL_COMMUNITY): Payer: Self-pay | Admitting: *Deleted

## 2023-04-12 ENCOUNTER — Ambulatory Visit (INDEPENDENT_AMBULATORY_CARE_PROVIDER_SITE_OTHER): Payer: Medicare Other

## 2023-04-12 ENCOUNTER — Ambulatory Visit (HOSPITAL_COMMUNITY)
Admission: EM | Admit: 2023-04-12 | Discharge: 2023-04-12 | Disposition: A | Discharge status: Home / Self Care | Payer: Medicare Other | Attending: Internal Medicine | Admitting: Internal Medicine

## 2023-04-12 DIAGNOSIS — R059 Cough, unspecified: Secondary | ICD-10-CM | POA: Diagnosis not present

## 2023-04-12 DIAGNOSIS — Z8616 Personal history of COVID-19: Secondary | ICD-10-CM | POA: Diagnosis not present

## 2023-04-12 NOTE — Discharge Instructions (Signed)
The x-ray reading we discussed is preliminary. Your x-ray will be read by a radiologist in next few hours. If there is a discrepancy, you will be contacted, and instructed on a new plan for you care.    Follow-up with your doctor as needed

## 2023-04-12 NOTE — ED Triage Notes (Signed)
Pt states he had COVID 3 weeks ago and he still has the cough. He states that the cough is worse at night. He states he was taking the codeine cough meds which help.

## 2023-04-12 NOTE — ED Provider Notes (Signed)
[facility]    CSN: [id number] Arrival date & time: 04/12/23  1436      History   Chief Complaint Chief Complaint  Patient presents with   Cough    HPI [name] [name] is a 72 y.o. male.    Cough Associated symptoms: fever ("99") and sore throat   Associated symptoms: no chest pain, no chills, no ear pain, no rhinorrhea, no shortness of breath and no wheezing   Diagnosed with COVID 03/17/2023, needs to cough, occasional sputum Had low-grade fever 2 days ago temp was 99. Denies chest pain, shortness of breath, fatigue, body aches, rhinorrhea, nasal congestion.  Admits mild scratchy throat from coughing.  Denies swollen glands, rashes or skin changes, vomiting or diarrhea.  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   Need for vaccination 07/01/2022   Prediabetes 07/01/2022   Encounter for general adult medical examination with abnormal findings 07/01/2022   Thrombocytopenia (HCC) 11/12/2021   Prostate cancer (HCC) 01/08/2019   Nondisplaced fracture of medial malleolus of right tibia, initial encounter for closed fracture 02/12/2017   Tibia fracture 02/11/2017   Essential hypertension 05/25/2016   Hyperlipidemia 08/02/2015   CKD (chronic kidney disease), stage III (HCC) 07/31/2015   Legally blind in right eye, as defined in [place] 07/10/2015    Past Surgical History:  Procedure Laterality Date   INCISION AND DRAINAGE OF WOUND Right 05/25/2016   Procedure: IRRIGATION AND DEBRIDEMENT WOUND RIGHT FOOT;  Surgeon: Felecia Shelling, DPM;  Location: [facility];  Service: Podiatry;  Laterality: Right;   LYMPHADENECTOMY Bilateral 01/08/2019   Procedure: LYMPHADENECTOMY, PELVIC BILATERAL;  Surgeon: Heloise Purpura, MD;  Location: [facility];  Service: Urology;  Laterality: Bilateral;   ROBOT ASSISTED LAPAROSCOPIC RADICAL  PROSTATECTOMY N/A 01/08/2019   Procedure: XI ROBOTIC ASSISTED LAPAROSCOPIC RADICAL PROSTATECTOMY LEVEL 2;  Surgeon: Heloise Purpura, MD;  Location: [facility];  Service: Urology;  Laterality: N/A;   TIBIA IM NAIL INSERTION Right 02/12/2017   Procedure: INTRAMEDULLARY (IM) NAIL TIBIAL, RIGHT;  Surgeon: Tarry Kos, MD;  Location: [facility];  Service: Orthopedics;  Laterality: Right;       Home Medications    Prior to Admission medications   Medication Sig Start Date End Date Taking? Authorizing Provider  amLODipine (NORVASC) 5 MG tablet TAKE 1 TABLET(5 MG) BY MOUTH DAILY. Please schedule office visit for more refills. 08/12/22  Yes Gwenevere Abbot, MD  atorvastatin (LIPITOR) 20 MG tablet TAKE 1 TABLET(20 MG) BY MOUTH EVERY EVENING 08/12/22  Yes Gwenevere Abbot, MD  benzonatate (TESSALON PERLES) 100 MG capsule Take 1 capsule (100 mg total) by mouth 3 (three) times daily as needed for cough. 03/29/23  Yes Rocky Morel, DO  guaiFENesin-codeine 100-10 MG/5ML syrup Take 5 mLs by mouth 2 (two) times daily as needed. 04/01/23  Yes [provider]  irbesartan (AVAPRO) 150 MG tablet Take 1 tablet (150 mg total) by mouth daily. 12/06/22 12/06/23 Yes Gwenevere Abbot, MD  potassium chloride SA (KLOR-CON M) 20 MEQ tablet Take 1 tablet (20 mEq total) by mouth 2 (two) times daily. 03/27/23  Yes Dione Booze, MD  predniSONE (DELTASONE) 50 MG tablet Take 1 tablet (50 mg total) by mouth daily. 03/27/23  Yes Dione Booze, MD  albuterol (VENTOLIN HFA) 108 (90 Base) MCG/ACT inhaler Inhale 2 puffs into the lungs every 4 (four) hours  as needed for wheezing or shortness of breath (or coughing). 03/27/23   Dione Booze, MD    Family History Family History  Problem Relation Age of Onset   Cancer Mother    Heart disease Father    Hypertension Father     Social History Social History   Tobacco Use   Smoking status: Never   Smokeless tobacco: Never  Vaping Use   Vaping status: Never Used  Substance Use Topics   Alcohol  use: No   Drug use: No     Allergies   Doxycycline and Penicillins   Review of Systems Review of Systems  Constitutional:  Positive for fever ("99"). Negative for chills.  HENT:  Positive for sore throat. Negative for ear pain, rhinorrhea and trouble swallowing.   Respiratory:  Positive for cough. Negative for shortness of breath and wheezing.   Cardiovascular:  Negative for chest pain.  Gastrointestinal:  Negative for diarrhea, nausea and vomiting.     Physical Exam Triage Vital Signs ED Triage Vitals  Encounter Vitals Group     BP 04/12/23 1446 125/85     Systolic BP Percentile --      Diastolic BP Percentile --      Pulse Rate 04/12/23 1446 63     Resp 04/12/23 1446 18     Temp 04/12/23 1446 98 F (36.7 C)     Temp Source 04/12/23 1446 Oral     SpO2 04/12/23 1446 96 %     Weight --      Height --      Head Circumference --      Peak Flow --      Pain Score 04/12/23 1444 0     Pain Loc --      Pain Education --      Exclude from Growth Chart --    No data found.  Updated Vital Signs BP 125/85 (BP Location: Left Arm)   Pulse 63   Temp 98 F (36.7 C) (Oral)   Resp 18   SpO2 96%   Visual Acuity Right Eye Distance:   Left Eye Distance:   Bilateral Distance:    Right Eye Near:   Left Eye Near:    Bilateral Near:     Physical Exam Vitals and nursing note reviewed.  HENT:     Head: Normocephalic and atraumatic.     Right Ear: Tympanic membrane and ear canal normal.     Left Ear: Tympanic membrane and ear canal normal.     Nose: No rhinorrhea.     Mouth/Throat:     Mouth: Mucous membranes are moist.     Pharynx: No oropharyngeal exudate or posterior oropharyngeal erythema.  Eyes:     Conjunctiva/sclera: Conjunctivae normal.  Cardiovascular:     Rate and Rhythm: Normal rate and regular rhythm.  Pulmonary:     Effort: Pulmonary effort is normal. No respiratory distress.     Breath sounds: Normal breath sounds. No wheezing.  Musculoskeletal:      Cervical back: Neck supple.  Lymphadenopathy:     Cervical: No cervical adenopathy.  Skin:    General: Skin is warm.  Neurological:     Mental Status: He is alert and oriented to person, place, and time.      UC Treatments / Results  Labs (all labs ordered are listed, but only abnormal results are displayed) Labs Reviewed - No data to display  EKG   Radiology No results found.  Procedures Procedures (including critical  care time)  Medications Ordered in UC Medications - No data to display  Initial Impression / Assessment and Plan / UC Course  I have reviewed the triage vital signs and the nursing notes.  Pertinent labs & imaging results that were available during my care of the patient were reviewed by me and considered in my medical decision making (see chart for details).     72 yo with cough for 3 weeks following COVID infection.  Vital signs are stable, he is not hypoxic or tachypneic, lungs are clear to auscultation, chest x-ray independently viewed by me NAD, Discussed with patient cough can linger for several weeks following a respiratory infection.  Recommend Robitussin DM.  Warning signs and follow-up reviewed follow-up with PCP Final Clinical Impressions(s) / UC Diagnoses   Final diagnoses:  None   Discharge Instructions   None    ED Prescriptions   None    PDMP not reviewed this encounter.   Meliton Rattan, [place] 04/12/23 [phone]

## 2023-04-23 ENCOUNTER — Other Ambulatory Visit: Payer: Self-pay | Admitting: Internal Medicine

## 2023-04-23 DIAGNOSIS — I10 Essential (primary) hypertension: Secondary | ICD-10-CM

## 2023-04-23 DIAGNOSIS — E7849 Other hyperlipidemia: Secondary | ICD-10-CM

## 2023-05-25 ENCOUNTER — Ambulatory Visit: Payer: Medicare Other | Admitting: Student

## 2023-05-25 ENCOUNTER — Encounter: Payer: Self-pay | Admitting: Student

## 2023-05-25 VITALS — BP 138/86 | HR 71 | Temp 98.2°F | Ht 73.0 in | Wt 232.9 lb

## 2023-05-25 DIAGNOSIS — Z1211 Encounter for screening for malignant neoplasm of colon: Secondary | ICD-10-CM

## 2023-05-25 DIAGNOSIS — Z23 Encounter for immunization: Secondary | ICD-10-CM | POA: Diagnosis not present

## 2023-05-25 DIAGNOSIS — K5900 Constipation, unspecified: Secondary | ICD-10-CM | POA: Insufficient documentation

## 2023-05-25 HISTORY — DX: Encounter for immunization: Z23

## 2023-05-25 HISTORY — DX: Constipation, unspecified: K59.00

## 2023-05-25 MED ORDER — POLYETHYLENE GLYCOL 3350 17 G PO PACK: 17.0000 g | PACK | Freq: Every day | ORAL | 0 refills | Status: AC

## 2023-05-25 NOTE — Patient Instructions (Addendum)
Kerwin, Poehlman you for allowing me to take part in your care today.  Here are your instructions.  1. Regarding your constipation, I want you to start miralax daily until you have a bowel movement. If you do not have a bowel movement in the next 2 days, please increase the miralax to two times a day. Please drink plenty of water.  2. I am referring you for a colonoscopy for colon cancer screening. Please wait for phone call to schedule.   3. Please return in about 3 months to discuss your high blood pressure.  4. If you develop significant abdominal swelling and distention and you stop passing gas, or develop vomiting that looks like your stool,please go to the emergency department, there could be concern that you developed an obstruction.  Thank you, Dr. Allena Katz  If you have any other questions please contact the internal medicine clinic at [phone] If it is after hours, please call the [place] hospital at [phone] and then ask the person who picks up for the resident on call.

## 2023-05-25 NOTE — Assessment & Plan Note (Signed)
Referred patient for colonoscopy

## 2023-05-25 NOTE — Assessment & Plan Note (Signed)
Administered Flu vaccine today.

## 2023-05-25 NOTE — Assessment & Plan Note (Signed)
Patient presents to the clinic today with concerns of 2-day history of constipation.  Normally, patient has 1 bowel movement daily.  His last bowel movement was 2 days ago on May 23, 2023.  This bowel movement was normal.  Since then, patient has not had a bowel movement.  He does report that he has been passing gas.  He states that he has been having some abdominal distention.  He denies any nausea or vomiting.  He denies any bloody stools.  Of note, patient did have laparoscopic prostatectomy in 12/2018. On exam, patient does have hyperactive bowel sounds.  Patient does have some firmness noted to left lower quadrant with mild tenderness.  Overall, I think patient has some constipation.  With history of surgery, SBO could be a concern, but less likely at this time.  Do not think this is medication induced constipation.  Will start with MiraLAX daily, and titrate up as needed.  Instructed patient to keep a look out for nausea, vomiting, further abdominal distention, and inability to pass gas.  If he does develop these symptoms, he will need to go to the emergency department.  Plan: -Start MiraLAX daily, until patient has bowel movement -If patient does not have a bowel movement in the next 2 days, instructed patient to increase MiraLAX to twice daily -Instruction given to patient about signs to watch out for and if these signs develop, go to emergency department -Patient encouraged to stay well-hydrated

## 2023-05-25 NOTE — Progress Notes (Signed)
CC: Constipation  HPI:  [name] is a 72 y.o. male with a past medical history of hypertension, prostate cancer, CKD stage IIIa, hyperlipidemia who presents with concerns of possible constipation.  Please see assessment and plan for full HPI.  Had pound cake yesterday, He states that he had some water afterwards. He states that his last bowel movement was Monday. Normal bowel movement.usually goes one time a day. Usually goes in  the morning. Yesterday and today nothing. Passing gas. Ate cabbage potatoes and steak. No opioid medications. No nausea or vomiting. Feels like belly is hurting. Tried nothing for this. No blood.   Past Medical History:  Diagnosis Date   Hypercholesteremia    Hypertension      Current Outpatient Medications:    polyethylene glycol (MIRALAX) 17 g packet, Take 17 g by mouth daily., Disp: 30 packet, Rfl: 0   albuterol (VENTOLIN HFA) 108 (90 Base) MCG/ACT inhaler, Inhale 2 puffs into the lungs every 4 (four) hours as needed for wheezing or shortness of breath (or coughing)., Disp: 8.5 g, Rfl: 0   amLODipine (NORVASC) 5 MG tablet, TAKE 1 TABLET(5 MG) BY MOUTH DAILY, Disp: 90 tablet, Rfl: 2   atorvastatin (LIPITOR) 20 MG tablet, TAKE 1 TABLET(20 MG) BY MOUTH EVERY EVENING, Disp: 90 tablet, Rfl: 2   benzonatate (TESSALON PERLES) 100 MG capsule, Take 1 capsule (100 mg total) by mouth 3 (three) times daily as needed for cough., Disp: 20 capsule, Rfl: 0   guaiFENesin-codeine 100-10 MG/5ML syrup, Take 5 mLs by mouth 2 (two) times daily as needed., Disp: , Rfl:    irbesartan (AVAPRO) 150 MG tablet, Take 1 tablet (150 mg total) by mouth daily., Disp: 30 tablet, Rfl: 11   potassium chloride SA (KLOR-CON M) 20 MEQ tablet, Take 1 tablet (20 mEq total) by mouth 2 (two) times daily., Disp: 10 tablet, Rfl: 0   predniSONE (DELTASONE) 50 MG tablet, Take 1 tablet (50 mg total) by mouth daily., Disp: 5 tablet, Rfl: 0  Review of Systems:    GI: Patient endorses  constipation and abdominal pain  Physical Exam:  Vitals:   05/25/23 1050  BP: 138/86  Pulse: 71  Temp: 98.2 F (36.8 C)  TempSrc: Oral  SpO2: 100%  Weight: 232 lb 14.4 oz (105.6 kg)  Height: 6\' 1"  (1.854 m)    General: Patient is sitting comfortably in the room  Cardio: Regular rate and rhythm, no murmurs, rubs or gallops. 2+ pulses to bilateral upper and lower extremities  Pulmonary: Clear to ausculation bilaterally with no rales, rhonchi, and crackles  Abdomen: Left lower quadrant firmness appreciated, slight tenderness, no rebound or guarding.  Hyperactive bowel sounds, no distention  Assessment & Plan:   Constipation Patient presents to the clinic today with concerns of 2-day history of constipation.  Normally, patient has 1 bowel movement daily.  His last bowel movement was 2 days ago on May 23, 2023.  This bowel movement was normal.  Since then, patient has not had a bowel movement.  He does report that he has been passing gas.  He states that he has been having some abdominal distention.  He denies any nausea or vomiting.  He denies any bloody stools.  Of note, patient did have laparoscopic prostatectomy in 12/2018. On exam, patient does have hyperactive bowel sounds.  Patient does have some firmness noted to left lower quadrant with mild tenderness.  Overall, I think patient has some constipation.  With history of surgery, SBO could be a  concern, but less likely at this time.  Do not think this is medication induced constipation.  Will start with MiraLAX daily, and titrate up as needed.  Instructed patient to keep a look out for nausea, vomiting, further abdominal distention, and inability to pass gas.  If he does develop these symptoms, he will need to go to the emergency department.  Plan: -Start MiraLAX daily, until patient has bowel movement -If patient does not have a bowel movement in the next 2 days, instructed patient to increase MiraLAX to twice daily -Instruction  given to patient about signs to watch out for and if these signs develop, go to emergency department -Patient encouraged to stay well-hydrated  Colon cancer screening Referred patient for colonoscopy.   Flu vaccine need Administered Flu vaccine today.   Patient discussed with Dr.  Dickie La  Modena Slater, DO PGY-2 Internal Medicine Resident  Pager: [phone]

## 2023-06-01 NOTE — Progress Notes (Signed)
Internal Medicine Clinic Attending  Case discussed with Dr. Patel  At the time of the visit.  We reviewed the resident's history and exam and pertinent patient test results.  I agree with the assessment, diagnosis, and plan of care documented in the resident's note.  

## 2023-10-24 ENCOUNTER — Encounter: Admitting: Internal Medicine

## 2023-10-24 ENCOUNTER — Encounter: Payer: Self-pay | Admitting: Internal Medicine

## 2023-10-24 NOTE — Progress Notes (Deleted)
   CC: PCP follow up  HPI:  Mr.Glendell Torchio is a 73 y.o. with medical history of HTN, HLD, CKDIIIa, prostate cancer s/p prostatectomy 2020, Prediabetes  presenting to [facility] for PCP follow up.   Please see problem-based list for further details, assessments, and plans.  Past Medical History:  Diagnosis Date   Hypercholesteremia    Hypertension     Current Outpatient Medications (Endocrine & Metabolic):    predniSONE  (DELTASONE ) 50 MG tablet, Take 1 tablet (50 mg total) by mouth daily.  Current Outpatient Medications (Cardiovascular):    amLODipine  (NORVASC ) 5 MG tablet, TAKE 1 TABLET(5 MG) BY MOUTH DAILY   atorvastatin  (LIPITOR) 20 MG tablet, TAKE 1 TABLET(20 MG) BY MOUTH EVERY EVENING   irbesartan  (AVAPRO ) 150 MG tablet, Take 1 tablet (150 mg total) by mouth daily.  Current Outpatient Medications (Respiratory):    albuterol  (VENTOLIN  HFA) 108 (90 Base) MCG/ACT inhaler, Inhale 2 puffs into the lungs every 4 (four) hours as needed for wheezing or shortness of breath (or coughing).   benzonatate  (TESSALON  PERLES) 100 MG capsule, Take 1 capsule (100 mg total) by mouth 3 (three) times daily as needed for cough.   guaiFENesin -codeine  100-10 MG/5ML syrup, Take 5 mLs by mouth 2 (two) times daily as needed.    Current Outpatient Medications (Other):    potassium chloride  SA (KLOR-CON  M) 20 MEQ tablet, Take 1 tablet (20 mEq total) by mouth 2 (two) times daily.  Review of Systems:  Review of system negative unless stated in the problem list or HPI.    Physical Exam:  There were no vitals filed for this visit. Physical Exam General: NAD HENT: NCAT Lungs: CTAB, no wheeze, rhonchi or rales.  Cardiovascular: Normal heart sounds, no r/m/g, 2+ pulses in all extremities. No LE edema Abdomen: No TTP, normal bowel sounds MSK: No asymmetry or muscle atrophy.  Skin: no lesions noted on exposed skin Neuro: Alert and oriented x4. CN grossly intact Psych: Normal mood and normal  affect   Assessment & Plan:   No problem-specific Assessment & Plan notes found for this encounter.   See Encounters Tab for problem based charting.  Patient Discussed with Dr. {NAMES:[id number]::"Guilloud","Hoffman","Mullen","Narendra","Vincent","Machen","Lau","Hatcher","Williams"} Jackolyn Masker, MD Tommas Fragmin. [facility] Internal Medicine Residency, PGY-3

## 2023-10-26 ENCOUNTER — Ambulatory Visit

## 2023-10-26 VITALS — Ht 73.0 in | Wt 230.0 lb

## 2023-10-26 DIAGNOSIS — Z0000 Encounter for general adult medical examination without abnormal findings: Secondary | ICD-10-CM

## 2023-10-26 NOTE — Progress Notes (Signed)
 Because this visit was a virtual/telehealth visit,  certain criteria was not obtained, such a blood pressure, CBG if applicable, and timed get up and go. Any medications not marked as "taking" were not mentioned during the medication reconciliation part of the visit. Any vitals not documented were not able to be obtained due to this being a telehealth visit or patient was unable to self-report a recent blood pressure reading due to a lack of equipment at home via telehealth. Vitals that have been documented are verbally provided by the patient.   Subjective:   [name] [name] is a 74 y.o. who presents for a Medicare Wellness preventive visit.  Visit Complete: Virtual I connected with  [name] [name] on 10/26/23 by a audio enabled telemedicine application and verified that I am speaking with the correct person using two identifiers.  Patient Location: Home  Provider Location: Office/Clinic  I discussed the limitations of evaluation and management by telemedicine. The patient expressed understanding and agreed to proceed.  Vital Signs: Because this visit was a virtual/telehealth visit, some criteria may be missing or patient reported. Any vitals not documented were not able to be obtained and vitals that have been documented are patient reported.  VideoDeclined- This patient declined Librarian, academic. Therefore the visit was completed with audio only.  Persons Participating in Visit: Patient.  AWV Questionnaire: No: Patient Medicare AWV questionnaire was not completed prior to this visit.  Cardiac Risk Factors include: advanced age (>67men, >89 women);dyslipidemia;family history of premature cardiovascular disease;hypertension;male gender;obesity (BMI >30kg/m2)     Objective:    Today's Vitals   10/26/23 1313  Weight: 230 lb (104.3 kg)  Height: 6\' 1"  (1.854 m)  PainSc: 0-No pain   Body mass index is 30.34 kg/m.     10/26/2023    1:15 PM 08/26/2022    10:56 AM 08/04/2022    4:40 PM 09/01/2019    6:14 PM 01/08/2019   12:53 PM 01/08/2019    6:18 AM 01/05/2019    8:21 AM  Advanced Directives  Does Patient Have a Medical Advance Directive? No No No No No No No  Would patient like information on creating a medical advance directive? No - Patient declined No - Guardian declined No - Patient declined  No - Patient declined No - Patient declined No - Patient declined    Current Medications (verified) Outpatient Encounter Medications as of 10/26/2023  Medication Sig   albuterol  (VENTOLIN  HFA) 108 (90 Base) MCG/ACT inhaler Inhale 2 puffs into the lungs every 4 (four) hours as needed for wheezing or shortness of breath (or coughing).   amLODipine  (NORVASC ) 5 MG tablet TAKE 1 TABLET(5 MG) BY MOUTH DAILY   atorvastatin  (LIPITOR) 20 MG tablet TAKE 1 TABLET(20 MG) BY MOUTH EVERY EVENING   irbesartan  (AVAPRO ) 150 MG tablet Take 1 tablet (150 mg total) by mouth daily.   potassium chloride  SA (KLOR-CON  M) 20 MEQ tablet Take 1 tablet (20 mEq total) by mouth 2 (two) times daily.   benzonatate  (TESSALON  PERLES) 100 MG capsule Take 1 capsule (100 mg total) by mouth 3 (three) times daily as needed for cough. (Patient not taking: Reported on 10/26/2023)   guaiFENesin -codeine  100-10 MG/5ML syrup Take 5 mLs by mouth 2 (two) times daily as needed. (Patient not taking: Reported on 10/26/2023)   predniSONE  (DELTASONE ) 50 MG tablet Take 1 tablet (50 mg total) by mouth daily. (Patient not taking: Reported on 10/26/2023)   No facility-administered encounter medications on file as of 10/26/2023.  Allergies (verified) Doxycycline  and Penicillins   History: Past Medical History:  Diagnosis Date   Acute bronchitis 03/31/2023   Colon cancer screening 07/01/2022   Flu vaccine need 05/25/2023   Hypercholesteremia    Hypertension    Need for vaccination 07/01/2022   Past Surgical History:  Procedure Laterality Date   INCISION AND DRAINAGE OF WOUND Right 05/25/2016    Procedure: IRRIGATION AND DEBRIDEMENT WOUND RIGHT FOOT;  Surgeon: Dot Gazella, DPM;  Location: [facility] OR;  Service: Podiatry;  Laterality: Right;   LYMPHADENECTOMY Bilateral 01/08/2019   Procedure: LYMPHADENECTOMY, PELVIC BILATERAL;  Surgeon: Florencio Hunting, MD;  Location: [facility];  Service: Urology;  Laterality: Bilateral;   ROBOT ASSISTED LAPAROSCOPIC RADICAL PROSTATECTOMY N/A 01/08/2019   Procedure: XI ROBOTIC ASSISTED LAPAROSCOPIC RADICAL PROSTATECTOMY LEVEL 2;  Surgeon: Florencio Hunting, MD;  Location: [facility];  Service: Urology;  Laterality: N/A;   TIBIA IM NAIL INSERTION Right 02/12/2017   Procedure: INTRAMEDULLARY (IM) NAIL TIBIAL, RIGHT;  Surgeon: Wes Hamman, MD;  Location: [facility] OR;  Service: Orthopedics;  Laterality: Right;   Family History  Problem Relation Age of Onset   Cancer Mother    Heart disease Father    Hypertension Father    Social History   Socioeconomic History   Marital status: Married    Spouse name: Not on file   Number of children: Not on file   Years of education: Not on file   Highest education level: Not on file  Occupational History   Not on file  Tobacco Use   Smoking status: Never   Smokeless tobacco: Never  Vaping Use   Vaping status: Never Used  Substance and Sexual Activity   Alcohol use: No   Drug use: No   Sexual activity: Yes  Other Topics Concern   Not on file  Social History Narrative   Not on file   Social Drivers of Health   Financial Resource Strain: Low Risk  (10/26/2023)   Overall Financial Resource Strain (CARDIA)    Difficulty of Paying Living Expenses: Not hard at all  Food Insecurity: No Food Insecurity (10/26/2023)   Hunger Vital Sign    Worried About Running Out of Food in the Last Year: Never true    Ran Out of Food in the Last Year: Never true  Transportation Needs: No Transportation Needs (10/26/2023)   PRAPARE - Administrator, Civil Service (Medical): No    Lack of Transportation (Non-Medical): No  Physical  Activity: Sufficiently Active (10/26/2023)   Exercise Vital Sign    Days of Exercise per Week: 7 days    Minutes of Exercise per Session: 40 min  Stress: No Stress Concern Present (10/26/2023)   [employer] of Occupational Health - Occupational Stress Questionnaire    Feeling of Stress : Not at all  Social Connections: Socially Integrated (10/26/2023)   Social Connection and Isolation Panel [NHANES]    Frequency of Communication with Friends and Family: Once a week    Frequency of Social Gatherings with Friends and Family: Twice a week    Attends Religious Services: More than 4 times per year    Active Member of [employer] or Organizations: No    Attends Engineer, structural: More than 4 times per year    Marital Status: Married    Tobacco Counseling Counseling given: Not Answered    Clinical Intake:  Pre-visit preparation completed: Yes  Pain : No/denies pain Pain Score: 0-No pain     BMI -  recorded: 30.34 Nutritional Status: BMI > 30  Obese Nutritional Risks: None Diabetes: No  Lab Results  Component Value Date   HGBA1C 6.0 (H) 08/04/2022   HGBA1C 5.8 03/27/2021   HGBA1C 5.5 11/07/2017     How often do you need to have someone help you when you read instructions, pamphlets, or other written materials from your doctor or pharmacy?: 1 - Never  Interpreter Needed?: No  Information entered by :: Josephina Melcher N. Junie Avilla, LPN.   Activities of Daily Living     10/26/2023    1:18 PM  In your present state of health, do you have any difficulty performing the following activities:  Hearing? 0  Vision? 0  Difficulty concentrating or making decisions? 0  Walking or climbing stairs? 0  Dressing or bathing? 0  Doing errands, shopping? 0  Preparing Food and eating ? N  Using the Toilet? N  In the past six months, have you accidently leaked urine? N  Do you have problems with loss of bowel control? N  Managing your Medications? N  Managing your Finances? N   Housekeeping or managing your Housekeeping? N    Patient Care Team: Jackolyn Masker, MD as PCP - General (Internal Medicine)  Indicate any recent Medical Services you may have received from other than [facility] providers in the past year (date may be approximate).     Assessment:   This is a routine wellness examination for [facility].  Hearing/Vision screen Hearing Screening - Comments:: Denies hearing difficulties. Vision Screening - Comments:: No rx glasses, legally blind in right eye - up to date with routine eye exams with Dr. Maris Sickle.   Goals Addressed               This Visit's Progress     Patient Stated (pt-stated)        10/26/2023: To stay busy and keep my hands in God's hands.       Depression Screen     10/26/2023    1:17 PM 08/26/2022   10:55 AM 07/01/2022    9:21 AM 11/12/2021    3:38 PM 03/27/2021    8:07 AM 03/03/2020   10:44 AM 11/07/2017    9:01 AM  PHQ 2/9 Scores  PHQ - 2 Score 0 0 0 0 0 0 0  PHQ- 9 Score 0  0        Fall Risk     10/26/2023    1:17 PM 08/26/2022   10:56 AM 08/04/2022    4:40 PM 08/04/2022    4:39 PM 07/01/2022    9:21 AM  Fall Risk   Falls in the past year? 0 0  0   Number falls in past yr: 0 0  0 0  Injury with Fall? 0 0  0 0  Risk for fall due to : No Fall Risks No Fall Risks  No Fall Risks No Fall Risks  Follow up Falls prevention discussed;Falls evaluation completed Education provided;Falls evaluation completed Falls evaluation completed;Falls prevention discussed  Falls evaluation completed    MEDICARE RISK AT HOME:  Medicare Risk at Home Any stairs in or around the home?: No If so, are there any without handrails?: No Home free of loose throw rugs in walkways, pet beds, electrical cords, etc?: Yes Adequate lighting in your home to reduce risk of falls?: Yes Life alert?: No Use of a cane, walker or w/c?: No Grab bars in the bathroom?: No Shower chair or bench in shower?: Yes Elevated toilet  seat or a handicapped toilet?:  No  TIMED UP AND GO:  Was the test performed?  No  Cognitive Function: 6CIT completed    10/26/2023    1:18 PM  MMSE - Mini Mental State Exam  Not completed: Unable to complete        10/26/2023    1:18 PM 08/26/2022   10:57 AM  6CIT Screen  What Year? 0 points 0 points  What month? 0 points 0 points  What time? 0 points 0 points  Count back from 20 0 points 0 points  Months in reverse 0 points 0 points  Repeat phrase 0 points 0 points  Total Score 0 points 0 points    Immunizations Immunization History  Administered Date(s) Administered   Fluad Quad(high Dose 65+) 03/03/2020, 03/27/2021, 07/01/2022   Fluad Trivalent(High Dose 65+) 05/25/2023   PFIZER(Purple Top)SARS-COV-2 Vaccination 08/19/2019, 09/18/2019   Pneumococcal Polysaccharide-23 01/09/2019   Td 07/10/2015    Screening Tests Health Maintenance  Topic Date Due   Zoster Vaccines- Shingrix (1 of 2) Never done   Colonoscopy  Never done   Pneumonia Vaccine 25+ Years old (2 of 2 - PCV) 01/09/2020   INFLUENZA VACCINE  01/20/2024   Medicare Annual Wellness (AWV)  10/25/2024   DTaP/Tdap/Td (2 - Tdap) 07/09/2025   Hepatitis C Screening  Completed   HPV VACCINES  Aged Out   Meningococcal B Vaccine  Aged Out   COVID-19 Vaccine  Discontinued    Health Maintenance  Health Maintenance Due  Topic Date Due   Zoster Vaccines- Shingrix (1 of 2) Never done   Colonoscopy  Never done   Pneumonia Vaccine 100+ Years old (2 of 2 - PCV) 01/09/2020   Health Maintenance Items Addressed: Yes Patient aware of care gaps.  Patient is due for the following: Pneumonia, Shingrix vaccines and Colonoscopy was ordered on 05/25/2023.  Additional Screening:  Vision Screening: Recommended annual ophthalmology exams for early detection of glaucoma and other disorders of the eye.  Dental Screening: Recommended annual dental exams for proper oral hygiene  Community Resource Referral / Chronic Care Management: CRR required this visit?  No    CCM required this visit?  No     Plan:     I have personally reviewed and noted the following in the patient's chart:   Medical and social history Use of alcohol, tobacco or illicit drugs  Current medications and supplements including opioid prescriptions. Patient is not currently taking opioid prescriptions. Functional ability and status Nutritional status Physical activity Advanced directives List of other physicians Hospitalizations, surgeries, and ER visits in previous 12 months Vitals Screenings to include cognitive, depression, and falls Referrals and appointments  In addition, I have reviewed and discussed with patient certain preventive protocols, quality metrics, and best practice recommendations. A written personalized care plan for preventive services as well as general preventive health recommendations were provided to patient.     Margette Sheldon, LPN   06/26/1094   After Visit Summary: (Declined) Due to this being a telephonic visit, with patients personalized plan was offered to patient but patient Declined AVS at this time   Nurse Notes: Patient aware of current care gaps.  Patient is due for the following: Pneumonia, Shingrix vaccines and Colonoscopy was ordered on 05/25/2023.

## 2023-10-26 NOTE — Patient Instructions (Signed)
 [name] , Thank you for taking time to come for your Medicare Wellness Visit. I appreciate your ongoing commitment to your health goals. Please review the following plan we discussed and let me know if I can assist you in the future.   Referrals/Orders/Follow-Ups/Clinician Recommendations: keep maintaining your health by keeping your appointments with Dr. Ghalin Khan and any specialists that you may see.  Call us  if you need anything.  Have a great year!!!!  Suggested goals: Aim for 30 minutes of exercise or brisk walking 5 days per week.  Drink 6-8 glasses of water  each day. Eat 5-6 servings of fresh vegetables and fruits per day. Continue to do brain exercises such as reading, puzzles, games on your phone to help keep the brain sharp and active.  This is a list of the screening recommended for you and due dates:  Health Maintenance  Topic Date Due   Zoster (Shingles) Vaccine (1 of 2) Never done   Colon Cancer Screening  Never done   Pneumonia Vaccine (2 of 2 - PCV) 01/09/2020   Flu Shot  01/20/2024   Medicare Annual Wellness Visit  10/25/2024   DTaP/Tdap/Td vaccine (2 - Tdap) 07/09/2025   Hepatitis C Screening  Completed   HPV Vaccine  Aged Out   Meningitis B Vaccine  Aged Out   COVID-19 Vaccine  Discontinued    Advanced directives: (Declined) Advance directive discussed with you today. Even though you declined this today, please call our office should you change your mind, and we can give you the proper paperwork for you to fill out.  Next Medicare Annual Wellness Visit scheduled for next year: Yes, 10/31/2024 at 1:40 p.m PHONE visit with Nurse Health Advisor.  Have you seen your provider in the last 6 months (3 months if uncontrolled diabetes)? Yes

## 2023-11-01 NOTE — Progress Notes (Signed)
I reviewed the AWV findings with the provider who conducted the visit. I was present in the office suite and immediately available to provide assistance and direction throughout the time the service was provided.  

## 2023-11-07 ENCOUNTER — Encounter: Payer: Self-pay | Admitting: Internal Medicine

## 2023-11-07 ENCOUNTER — Ambulatory Visit (INDEPENDENT_AMBULATORY_CARE_PROVIDER_SITE_OTHER): Admitting: Internal Medicine

## 2023-11-07 VITALS — BP 157/82 | HR 45 | Temp 97.7°F | Ht 73.0 in | Wt 233.4 lb

## 2023-11-07 DIAGNOSIS — E785 Hyperlipidemia, unspecified: Secondary | ICD-10-CM | POA: Diagnosis not present

## 2023-11-07 DIAGNOSIS — I129 Hypertensive chronic kidney disease with stage 1 through stage 4 chronic kidney disease, or unspecified chronic kidney disease: Secondary | ICD-10-CM | POA: Diagnosis not present

## 2023-11-07 DIAGNOSIS — I440 Atrioventricular block, first degree: Secondary | ICD-10-CM

## 2023-11-07 DIAGNOSIS — N183 Chronic kidney disease, stage 3 unspecified: Secondary | ICD-10-CM

## 2023-11-07 DIAGNOSIS — I10 Essential (primary) hypertension: Secondary | ICD-10-CM | POA: Diagnosis not present

## 2023-11-07 DIAGNOSIS — Z0001 Encounter for general adult medical examination with abnormal findings: Secondary | ICD-10-CM

## 2023-11-07 DIAGNOSIS — N1831 Chronic kidney disease, stage 3a: Secondary | ICD-10-CM

## 2023-11-07 DIAGNOSIS — E7849 Other hyperlipidemia: Secondary | ICD-10-CM | POA: Diagnosis not present

## 2023-11-07 DIAGNOSIS — R7303 Prediabetes: Secondary | ICD-10-CM

## 2023-11-07 DIAGNOSIS — Z1211 Encounter for screening for malignant neoplasm of colon: Secondary | ICD-10-CM

## 2023-11-07 MED ORDER — ATORVASTATIN CALCIUM 40 MG PO TABS: 40.0000 mg | ORAL_TABLET | Freq: Every day | ORAL | 3 refills | Status: DC

## 2023-11-07 NOTE — Assessment & Plan Note (Signed)
 Patient with hypertension with blood pressure that is elevated today.  Patient's home regimen is amlodipine  5 mg and irbesartan  100 mg every day.  Patient is adherent to this regimen but states he did not take his medication prior to coming in.  He is reporting home readings of 130s/70s.  Will have patient come in for 2-week nurse BP check and have him bring a log.  If elevated at that time and log showing elevation of blood pressures, we will increase him to 300 mg irbesartan .  Will get BMP as last BMP showed hypokalemia.

## 2023-11-07 NOTE — Progress Notes (Signed)
 CC: PCP follow up  HPI:  [name] is a 73 y.o. with medical history of HTN, HLD, CKDIIIa, prostate cancer s/p prostatectomy 2020, Prediabetes  presenting to [facility] for PCP follow up.   Please see problem-based list for further details, assessments, and plans.  Past Medical History:  Diagnosis Date   Acute bronchitis 03/31/2023   Colon cancer screening 07/01/2022   Constipation 05/25/2023   Flu vaccine need 05/25/2023   Hypercholesteremia    Hypertension    Need for vaccination 07/01/2022     Current Outpatient Medications (Cardiovascular):    amLODipine  (NORVASC ) 5 MG tablet, TAKE 1 TABLET(5 MG) BY MOUTH DAILY   atorvastatin  (LIPITOR) 40 MG tablet, Take 1 tablet (40 mg total) by mouth daily.   irbesartan  (AVAPRO ) 150 MG tablet, Take 1 tablet (150 mg total) by mouth daily.      Review of Systems:  Review of system negative unless stated in the problem list or HPI.    Physical Exam:  Vitals:   11/07/23 0858 11/07/23 0902  BP: (!) 155/81 (!) 157/82  Pulse: (!) 46 (!) 45  Temp: 97.7 F (36.5 C)   TempSrc: Oral   SpO2: 100%   Weight: 233 lb 6.4 oz (105.9 kg)   Height: 6\' 1"  (1.854 m)    Physical Exam General: NAD HENT: NCAT Lungs: CTAB, no wheeze, rhonchi or rales.  Cardiovascular: bradycardic rate in 50s, sinus rhythm, no murmurs, good pulses in all extremities. No LE edema Abdomen: No TTP, normal bowel sounds MSK: No asymmetry or muscle atrophy.  Skin: no lesions noted on exposed skin Neuro: Alert and oriented x4. CN grossly intact Psych: Normal mood and normal affect   Assessment & Plan:   Essential hypertension Patient with hypertension with blood pressure that is elevated today.  Patient's home regimen is amlodipine  5 mg and irbesartan  100 mg every day.  Patient is adherent to this regimen but states he did not take his medication prior to coming in.  He is reporting home readings of 130s/70s.  Will have patient come in for 2-week nurse BP check  and have him bring a log.  If elevated at that time and log showing elevation of blood pressures, we will increase him to 300 mg irbesartan .  Will get BMP as last BMP showed hypokalemia.  CKD (chronic kidney disease), stage III (HCC) Repeating BMP this visit.  CKD appears to be stable.  Hyperlipidemia Patient with hyperlipidemia was on Lipitor 20 mg.  LDL checked last year was 34 with his ASCVD risk calculated at 27%.  Will increase his Lipitor to 40 mg.  Also repeat lipid panel this visit.  Prediabetes Will repeat A1c to monitor his prediabetes his last one was 1 year ago.  Encounter for general adult medical examination with abnormal findings Discussed colonoscopy with patient and he is interested in getting this done.  Will place order for him to see GI.  Patient states he had his pneumonia shot a couple years ago.  I do see polysaccharide 23 vaccine administered in 2020 but he needs PCV 20.  Will have him look for records to see if he has another dose and if not this can be given to him at follow-up appointment.  AV block, 1st degree Patient continues to be bradycardic but he is totally asymptomatic.  He is denying any lightheadedness, dizziness, palpitations, shortness of breath or chest pain.  Will continue to monitor this and advised him to be aware of the symptoms as if they  occur we need to work this up further.   See Encounters Tab for problem based charting.  Patient Discussed with Dr. Blythe Walsenburg, MD Tommas Fragmin. [facility] Internal Medicine Res[facility], PGY-3

## 2023-11-07 NOTE — Assessment & Plan Note (Signed)
 Patient continues to be bradycardic but he is totally asymptomatic.  He is denying any lightheadedness, dizziness, palpitations, shortness of breath or chest pain.  Will continue to monitor this and advised him to be aware of the symptoms as if they occur we need to work this up further.

## 2023-11-07 NOTE — Patient Instructions (Addendum)
 [name] [name], it was a pleasure seeing you today! You endorsed feeling well today. Below are some of the things we talked about this visit. We look forward to seeing you in the follow up appointment!  Today we discussed: Take your medications every morning even before coming to the doctor's office.   I am going to increase your lipitor to 40 mg.   I want you to come back in 2 weeks for blood pressure check. This is a nurse visit.   I am going to refer you for a colonoscopy.   We will see you back in 3 months.   I have ordered the following labs today:  Lab Orders         BMP8+Anion Gap         Magnesium          Lipid Profile         Hemoglobin A1c       Referrals ordered today:   Referral Orders         Ambulatory referral to Gastroenterology       I have ordered the following medication/changed the following medications:   Stop the following medications: Medications Discontinued During This Encounter  Medication Reason   potassium chloride  SA (KLOR-CON  M) 20 MEQ tablet Completed Course   predniSONE  (DELTASONE ) 50 MG tablet Completed Course   guaiFENesin -codeine  100-10 MG/5ML syrup Completed Course   benzonatate  (TESSALON  PERLES) 100 MG capsule Completed Course   albuterol  (VENTOLIN  HFA) 108 (90 Base) MCG/ACT inhaler Patient has not taken in last 30 days   atorvastatin  (LIPITOR) 20 MG tablet Reorder     Start the following medications: Meds ordered this encounter  Medications   atorvastatin  (LIPITOR) 40 MG tablet    Sig: Take 1 tablet (40 mg total) by mouth daily.    Dispense:  90 tablet    Refill:  3     Follow-up: 2 week BP check with nurse visit/3 month follow up.    Please make sure to arrive 15 minutes prior to your next appointment. If you arrive late, you may be asked to reschedule.   We look forward to seeing you next time. Please call our clinic at [phone] if you have any questions or concerns. The best time to call is Monday-Friday from  9am-4pm, but there is someone available 24/7. If after hours or the weekend, call the main hospital number and ask for the Internal Medicine Resident On-Call. If you need medication refills, please notify your pharmacy one week in advance and they will send us  a request.  Thank you for letting us  take part in your care. Wishing you the best!  Thank you, Jackolyn Masker, MD

## 2023-11-07 NOTE — Assessment & Plan Note (Signed)
 Patient with hyperlipidemia was on Lipitor 20 mg.  LDL checked last year was 72 with his ASCVD risk calculated at 27%.  Will increase his Lipitor to 40 mg.  Also repeat lipid panel this visit.

## 2023-11-07 NOTE — Assessment & Plan Note (Signed)
 Repeating BMP this visit.  CKD appears to be stable.

## 2023-11-07 NOTE — Assessment & Plan Note (Signed)
 Will repeat A1c to monitor his prediabetes his last one was 1 year ago.

## 2023-11-07 NOTE — Assessment & Plan Note (Addendum)
 Discussed colonoscopy with patient and he is interested in getting this done.  Will place order for him to see GI.  Patient states he had his pneumonia shot a couple years ago.  I do see polysaccharide 23 vaccine administered in 2020 but he needs PCV 20.  Will have him look for records to see if he has another dose and if not this can be given to him at follow-up appointment.

## 2023-11-08 ENCOUNTER — Ambulatory Visit: Payer: Self-pay | Admitting: Internal Medicine

## 2023-11-08 LAB — LIPID PANEL
Chol/HDL Ratio: 4.3 ratio (ref 0.0–5.0)
Cholesterol, Total: 142 mg/dL (ref 100–199)
HDL: 33 mg/dL — ABNORMAL LOW (ref >39)
LDL Chol Calc (NIH): 89 mg/dL (ref 0–99)
Triglycerides: 108 mg/dL (ref 0–149)
VLDL Cholesterol Cal: 20 mg/dL (ref 5–40)

## 2023-11-08 LAB — BMP8+ANION GAP
Anion Gap: 16 mmol/L (ref 10.0–18.0)
BUN/Creatinine Ratio: 12 (ref 10–24)
BUN: 17 mg/dL (ref 8–27)
CO2: 20 mmol/L (ref 20–29)
Calcium: 9.3 mg/dL (ref 8.6–10.2)
Chloride: 105 mmol/L (ref 96–106)
Creatinine, Ser: 1.39 mg/dL — ABNORMAL HIGH (ref 0.76–1.27)
Glucose: 96 mg/dL (ref 70–99)
Potassium: 3.8 mmol/L (ref 3.5–5.2)
Sodium: 141 mmol/L (ref 134–144)
eGFR: 55 mL/min/{1.73_m2} — ABNORMAL LOW (ref >59)

## 2023-11-08 LAB — HEMOGLOBIN A1C
Est. average glucose Bld gHb Est-mCnc: 114 mg/dL
Hgb A1c MFr Bld: 5.6 % (ref 4.8–5.6)

## 2023-11-08 LAB — MAGNESIUM: Magnesium: 2.2 mg/dL (ref 1.6–2.3)

## 2023-11-08 NOTE — Progress Notes (Signed)
 Internal Medicine Clinic Attending  Case discussed with the resident at the time of the visit.  We reviewed the resident's history and exam and pertinent patient test results.  I agree with the assessment, diagnosis, and plan of care documented in the resident's note.

## 2023-11-16 NOTE — Progress Notes (Signed)
 Internal Medicine Clinic Attending  Reviewed and accepted

## 2023-12-05 ENCOUNTER — Encounter: Payer: Self-pay | Admitting: *Deleted

## 2023-12-29 ENCOUNTER — Other Ambulatory Visit: Payer: Self-pay | Admitting: Internal Medicine

## 2024-01-14 ENCOUNTER — Emergency Department (HOSPITAL_COMMUNITY)
Admission: EM | Admit: 2024-01-14 | Discharge: 2024-01-14 | Disposition: A | Discharge status: Home / Self Care | Attending: Emergency Medicine | Admitting: Emergency Medicine

## 2024-01-14 ENCOUNTER — Emergency Department (HOSPITAL_COMMUNITY)

## 2024-01-14 ENCOUNTER — Ambulatory Visit (HOSPITAL_COMMUNITY)
Admission: EM | Admit: 2024-01-14 | Discharge: 2024-01-14 | Disposition: A | Discharge status: Home / Self Care | Attending: Internal Medicine | Admitting: Internal Medicine

## 2024-01-14 ENCOUNTER — Other Ambulatory Visit: Payer: Self-pay

## 2024-01-14 ENCOUNTER — Encounter (HOSPITAL_COMMUNITY): Payer: Self-pay | Admitting: Emergency Medicine

## 2024-01-14 ENCOUNTER — Encounter (HOSPITAL_COMMUNITY): Payer: Self-pay

## 2024-01-14 DIAGNOSIS — Z23 Encounter for immunization: Secondary | ICD-10-CM | POA: Diagnosis not present

## 2024-01-14 DIAGNOSIS — Z79899 Other long term (current) drug therapy: Secondary | ICD-10-CM | POA: Diagnosis not present

## 2024-01-14 DIAGNOSIS — T148XXA Other injury of unspecified body region, initial encounter: Secondary | ICD-10-CM

## 2024-01-14 DIAGNOSIS — S0081XA Abrasion of other part of head, initial encounter: Secondary | ICD-10-CM | POA: Diagnosis not present

## 2024-01-14 DIAGNOSIS — W19XXXA Unspecified fall, initial encounter: Secondary | ICD-10-CM

## 2024-01-14 DIAGNOSIS — S0990XA Unspecified injury of head, initial encounter: Secondary | ICD-10-CM | POA: Diagnosis present

## 2024-01-14 DIAGNOSIS — W109XXA Fall (on) (from) unspecified stairs and steps, initial encounter: Secondary | ICD-10-CM | POA: Diagnosis not present

## 2024-01-14 DIAGNOSIS — I129 Hypertensive chronic kidney disease with stage 1 through stage 4 chronic kidney disease, or unspecified chronic kidney disease: Secondary | ICD-10-CM | POA: Insufficient documentation

## 2024-01-14 DIAGNOSIS — Y9301 Activity, walking, marching and hiking: Secondary | ICD-10-CM | POA: Insufficient documentation

## 2024-01-14 DIAGNOSIS — N189 Chronic kidney disease, unspecified: Secondary | ICD-10-CM | POA: Diagnosis not present

## 2024-01-14 DIAGNOSIS — Z8546 Personal history of malignant neoplasm of prostate: Secondary | ICD-10-CM | POA: Insufficient documentation

## 2024-01-14 DIAGNOSIS — S0001XA Abrasion of scalp, initial encounter: Secondary | ICD-10-CM | POA: Diagnosis not present

## 2024-01-14 DIAGNOSIS — Y92009 Unspecified place in unspecified non-institutional (private) residence as the place of occurrence of the external cause: Secondary | ICD-10-CM | POA: Diagnosis not present

## 2024-01-14 MED ORDER — TETANUS-DIPHTH-ACELL PERTUSSIS 5-2.5-18.5 LF-MCG/0.5 IM SUSY
PREFILLED_SYRINGE | INTRAMUSCULAR | Status: AC
  Filled 2024-01-14: qty 0.5

## 2024-01-14 MED ORDER — TETANUS-DIPHTH-ACELL PERTUSSIS 5-2.5-18.5 LF-MCG/0.5 IM SUSY
0.5000 mL | PREFILLED_SYRINGE | Freq: Once | INTRAMUSCULAR | Status: AC
  Administered 2024-01-14: 0.5 mL via INTRAMUSCULAR

## 2024-01-14 MED ORDER — BACITRACIN-NEOMYCIN-POLYMYXIN OINTMENT TUBE
TOPICAL_OINTMENT | Freq: Once | CUTANEOUS | Status: AC
  Administered 2024-01-14: 1 via TOPICAL
  Filled 2024-01-14: qty 14.17

## 2024-01-14 NOTE — Discharge Instructions (Addendum)
 It was a pleasure taking care of you today.  Based on your history, physical exam, and imaging I feel you are safe for discharge.  The scans of your head and neck today were negative for acute fracture or injury.  Please continue to keep the abrasions on your forehead and under your left eye clean and dry.  Please return to the emergency department or seek further medical care if the symptoms following symptoms including but limited to fever, chills, dizziness, weakness, visual disturbances, severe head pain, severe neck pain, or other concerning symptoms.  If symptoms persist or worsen recommend follow-up with your primary care provider within 48 hours.  Otherwise recommend follow-up with primary care provider and specialist as scheduled.

## 2024-01-14 NOTE — ED Triage Notes (Signed)
 Pt presents after falling on outside step today. Pt hit head and left eye.   Denies loc, headache, or blurred vision

## 2024-01-14 NOTE — Discharge Instructions (Addendum)
 We updated your tetanus injection today. Please go to the nearest ER for CT scan of the head due to concern for head bleed based on criteria.

## 2024-01-14 NOTE — ED Provider Notes (Signed)
 [place] EMERGENCY DEPARTMENT AT [facility] Provider Note   CSN: [id number] Arrival date & time: 01/14/24  1731     Patient presents with: [name] [name]   Khian Remo is a 73 y.o. male who presents to the emergency department with a chief complaint of mechanical fall at home.  Patient was referred to the emergency department by urgent care due to the urgent care provider feeling the patient needed a CT scan to rule out intracranial injury.  Patient states that he was walking up the steps outside of his house whenever he tripped on his shoelace and fell forward hitting his face on his outside deck which is concrete.  Patient denies loss of consciousness, visual disturbances, headache.  Patient ambulatory since without assistance.  Denies blood thinner.  Denies dizziness or weakness.  Denies neck pain.  Patient has past medical history significant for right eye blindness, CKD, hyperlipidemia, hypertension, prostate cancer, thrombocytopenia, etc.    Fall       Prior to Admission medications   Medication Sig Start Date End Date Taking? Authorizing Provider  amLODipine  (NORVASC ) 5 MG tablet TAKE 1 TABLET(5 MG) BY MOUTH DAILY 04/26/23   Fernand Prost, MD  atorvastatin  (LIPITOR) 40 MG tablet Take 1 tablet (40 mg total) by mouth daily. 11/07/23 11/06/24  Fernand Prost, MD  irbesartan  (AVAPRO ) 150 MG tablet TAKE 1 TABLET(150 MG) BY MOUTH DAILY 12/29/23   King, Olivia, DO    Allergies: Doxycycline  and Penicillins    Review of Systems  Skin:  Positive for wound (Abrasions present to left cheek and left forehead).    Updated Vital Signs BP 122/71 (BP Location: Left Arm)   Pulse 61   Temp 97.9 F (36.6 C) (Oral)   Resp 16   SpO2 99%   Physical Exam Vitals and nursing note reviewed.  Constitutional:      General: He is awake. He is not in acute distress.    Appearance: Normal appearance. He is not ill-appearing, toxic-appearing or diaphoretic.  HENT:     Head: Normocephalic.      Comments: Abrasions noted on left cheek of patient's face as well as left forehead above eye, all wounds consistent with abrasions from fall, no deep lacerations, no raccoon eyes, no Battle sign, no significant swelling or hematoma  No significant tenderness with palpation of skull or facial bones Eyes:     General: No scleral icterus.    Comments: Patient gaze chronically misaligned due to chronic right eye blindness  Cardiovascular:     Rate and Rhythm: Normal rate and regular rhythm.  Pulmonary:     Effort: Pulmonary effort is normal. No respiratory distress.     Breath sounds: Normal breath sounds. No wheezing, rhonchi or rales.  Abdominal:     General: Abdomen is flat. There is no distension.     Palpations: Abdomen is soft.     Tenderness: There is no abdominal tenderness.  Musculoskeletal:        General: Normal range of motion.     Cervical back: Normal range of motion. No tenderness.     Right lower leg: No edema.     Left lower leg: No edema.  Skin:    General: Skin is warm and dry.     Capillary Refill: Capillary refill takes less than 2 seconds.     Comments: Abrasion like wounds to left cheek and left forehead, not actively bleeding  Neurological:     General: No focal deficit present.  Mental Status: He is alert and oriented to person, place, and time.     GCS: GCS eye subscore is 4. GCS verbal subscore is 5. GCS motor subscore is 6.     Cranial Nerves: No cranial nerve deficit, dysarthria or facial asymmetry.     Sensory: No sensory deficit.     Motor: Motor function is intact. No weakness.     Coordination: Coordination is intact. Coordination normal.     Gait: Gait normal.  Psychiatric:        Mood and Affect: Mood normal.        Behavior: Behavior normal. Behavior is cooperative.     (all labs ordered are listed, but only abnormal results are displayed) Labs Reviewed - No data to display  EKG: None  Radiology: CT Head Wo Contrast Result Date:  01/14/2024 CLINICAL DATA:  Clemens.  Hit head. EXAM: CT HEAD WITHOUT CONTRAST CT CERVICAL SPINE WITHOUT CONTRAST TECHNIQUE: Multidetector CT imaging of the head and cervical spine was performed following the standard protocol without intravenous contrast. Multiplanar CT image reconstructions of the cervical spine were also generated. RADIATION DOSE REDUCTION: This exam was performed according to the departmental dose-optimization program which includes automated exposure control, adjustment of the mA and/or kV according to patient size and/or use of iterative reconstruction technique. COMPARISON:  None Available. FINDINGS: CT HEAD FINDINGS Brain: There are numerous scattered calcifications throughout the cerebrum likely due to prior infectious or inflammatory process such as cysticercosis, CMV or toxoplasmosis. The ventricles are in the midline without mass effect or shift. Mild age related cerebral atrophy and mild periventricular white matter disease. No acute intracranial process such as hemispheric infarction or intracranial hemorrhage. No extra-axial fluid collections are identified. The brainstem and cerebellum are unremarkable. Vascular: No hyperdense vessel or unexpected calcification. Skull: No acute skull fracture or bone lesion. Mild hyperostosis frontalis interna. Sinuses/Orbits: The paranasal sinuses and mastoid air cells are clear. The globes are intact. Other: No scalp lesions or scalp hematoma. There is a small hematoma noted just below the left eye. CT CERVICAL SPINE FINDINGS Alignment: Normal Skull base and vertebrae: No acute fracture. Moderate degenerative changes at C1-2 with calcified pannus. Soft tissues and spinal canal: No prevertebral fluid or swelling. No visible canal hematoma. Disc levels: The spinal canal is generous. No large disc protrusions spinal or canal stenosis. Age related uncinate spurring and facet disease contributing to mild multilevel foraminal stenosis. Upper chest: The lung  apices are grossly clear. Other: No neck mass, adenopathy or. Carotid artery calcifications are noted. IMPRESSION: 1. Numerous scattered calcifications throughout the cerebrum likely due to prior infectious or inflammatory process. 2. Mild age related cerebral atrophy and periventricular white matter disease. 3. No acute intracranial findings or skull fracture. 4. Small hematoma just below the left eye. 5. Normal alignment of the cervical vertebral bodies and no acute fracture. Electronically Signed   By: MYRTIS Stammer M.D.   On: 01/14/2024 19:04   CT Cervical Spine Wo Contrast Result Date: 01/14/2024 CLINICAL DATA:  Clemens.  Hit head. EXAM: CT HEAD WITHOUT CONTRAST CT CERVICAL SPINE WITHOUT CONTRAST TECHNIQUE: Multidetector CT imaging of the head and cervical spine was performed following the standard protocol without intravenous contrast. Multiplanar CT image reconstructions of the cervical spine were also generated. RADIATION DOSE REDUCTION: This exam was performed according to the departmental dose-optimization program which includes automated exposure control, adjustment of the mA and/or kV according to patient size and/or use of iterative reconstruction technique. COMPARISON:  None Available. FINDINGS:  CT HEAD FINDINGS Brain: There are numerous scattered calcifications throughout the cerebrum likely due to prior infectious or inflammatory process such as cysticercosis, CMV or toxoplasmosis. The ventricles are in the midline without mass effect or shift. Mild age related cerebral atrophy and mild periventricular white matter disease. No acute intracranial process such as hemispheric infarction or intracranial hemorrhage. No extra-axial fluid collections are identified. The brainstem and cerebellum are unremarkable. Vascular: No hyperdense vessel or unexpected calcification. Skull: No acute skull fracture or bone lesion. Mild hyperostosis frontalis interna. Sinuses/Orbits: The paranasal sinuses and mastoid air  cells are clear. The globes are intact. Other: No scalp lesions or scalp hematoma. There is a small hematoma noted just below the left eye. CT CERVICAL SPINE FINDINGS Alignment: Normal Skull base and vertebrae: No acute fracture. Moderate degenerative changes at C1-2 with calcified pannus. Soft tissues and spinal canal: No prevertebral fluid or swelling. No visible canal hematoma. Disc levels: The spinal canal is generous. No large disc protrusions spinal or canal stenosis. Age related uncinate spurring and facet disease contributing to mild multilevel foraminal stenosis. Upper chest: The lung apices are grossly clear. Other: No neck mass, adenopathy or. Carotid artery calcifications are noted. IMPRESSION: 1. Numerous scattered calcifications throughout the cerebrum likely due to prior infectious or inflammatory process. 2. Mild age related cerebral atrophy and periventricular white matter disease. 3. No acute intracranial findings or skull fracture. 4. Small hematoma just below the left eye. 5. Normal alignment of the cervical vertebral bodies and no acute fracture. Electronically Signed   By: MYRTIS Stammer M.D.   On: 01/14/2024 19:04     Procedures   Medications Ordered in the ED  neomycin -bacitracin -polymyxin (NEOSPORIN) ointment (1 Application Topical Given 01/14/24 1953)                                    Medical Decision Making Amount and/or Complexity of Data Reviewed Radiology: ordered.   Patient presents to the ED for concern of fall, head injury, this involves an extensive number of treatment options, and is a complaint that carries with it a high risk of complications and morbidity.  The differential diagnosis includes facial fracture, skull fracture, brain bleed, cervical spine fracture, etc.   Co morbidities that complicate the patient evaluation  Age, right eye blindness, CKD, hyperlipidemia, hypertension, prostate cancer, thrombocytopenia   Additional history obtained:  I  reviewed the note from urgent care before patient was sent over which recommended head CT, patient neuroexam intact at time of patient being sent over  Imaging Studies ordered:  I ordered imaging studies including CT head, CT cervical spine I independently visualized and interpreted imaging which showed no acute abnormality needing admission I agree with the radiologist interpretation   Medicines ordered and prescription drug management:  I have reviewed the patients home medicines and have made adjustments as needed   Test Considered:  None   Critical Interventions:  None   Problem List / ED Course:  73 year old male, mechanical fall at home, hit head on outside porch that is concrete, seen at urgent care and referred to the emergency department for CT imaging Denies blood thinners, intact neuroexam, denies headache or dizziness Patient clinically well-appearing, vital signs stable, CT scan of head and neck ordered for thoroughness CT scan of head and neck negative for acute abnormality needing admission, however some age-related changes and scattered calcifications due to previous infectious versus inflammatory process, patient  educated on these findings and told to make primary care provider aware, however reassured that this is not seem related to the events of today and that nothing acutely needs to be done based off of today's workup Abrasion on forehead and cheek redressed, ointment applied Patient still well-appearing on reassessment, stable for discharge at this time, vital signs stable Return precautions given Patient discharged Most likely diagnosis at this time is soft tissue injury related to fall, no acute intracranial process, no skull fracture or facial fractures noted, no cervical spine fractures noted, patient overall well appearing    Reevaluation:  After the interventions noted above, I reevaluated the patient and found that they have : Stayed the  same   Social Determinants of Health:  none   Dispostion:  After consideration of the diagnostic results and the patients response to treatment, I feel that the patent would benefit from discharge and follow-up with specialist and primary care provider as scheduled, sooner if symptoms warrant.     Final diagnoses:  Fall, initial encounter  Injury of head, initial encounter  Abrasion    ED Discharge Orders     None          Janetta Terrall JULIANNA DEVONNA 01/14/24 2056    Doretha Folks, MD 01/14/24 2235

## 2024-01-14 NOTE — ED Provider Notes (Signed)
 [facility]    CSN: [id number] Arrival date & time: 01/14/24  1558      History   Chief Complaint Chief Complaint  Patient presents with   Fall   Head Injury    HPI [name] [name] is a 73 y.o. male.   [name] [name] is a 73 y.o. male presenting for chief complaint of Fall and Head Injury that happened today while he was walking up his front steps at home. He tripped over his shoelace causing him to fall forward onto his front porch. He hit his head during the fall and has suffered an abrasion to the left side of the head, swelling underneath the left eye, and abrasion to the right side of the face/head.  He was able to stand up after falling and felt fine.   Wounds bled a little bit and were oozing, bleeding controlled with pressure.  He has been placing ice on his wounds.  He did not pass out after falling and denies nausea, vomiting, and blurry vision after falling.  Denies headache.  He does not take blood thinners.  Denies previous head injuries, memory changes since fall, and dizziness/new visual disturbances.  Last tetanus injection was in 2017.   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   Prediabetes 07/01/2022   Encounter for general adult medical examination with abnormal findings 07/01/2022   Thrombocytopenia (HCC) 11/12/2021   Prostate cancer (HCC) 01/08/2019   Essential hypertension 05/25/2016   Hyperlipidemia 08/02/2015   CKD (chronic kidney disease), stage III (HCC) 07/31/2015   Legally blind in right eye, as defined in [place]  07/10/2015    Past Surgical History:  Procedure Laterality Date   INCISION AND DRAINAGE OF WOUND Right 05/25/2016    Procedure: IRRIGATION AND DEBRIDEMENT WOUND RIGHT FOOT;  Surgeon: Thresa CHRISTELLA Sar, DPM;  Location: [facility];  Service: Podiatry;  Laterality: Right;   LYMPHADENECTOMY Bilateral 01/08/2019   Procedure: LYMPHADENECTOMY, PELVIC BILATERAL;  Surgeon: Renda Glance, MD;  Location: [facility];  Service: Urology;  Laterality: Bilateral;   ROBOT ASSISTED LAPAROSCOPIC RADICAL PROSTATECTOMY N/A 01/08/2019   Procedure: XI ROBOTIC ASSISTED LAPAROSCOPIC RADICAL PROSTATECTOMY LEVEL 2;  Surgeon: Renda Glance, MD;  Location: [facility];  Service: Urology;  Laterality: N/A;   TIBIA IM NAIL INSERTION Right 02/12/2017   Procedure: INTRAMEDULLARY (IM) NAIL TIBIAL, RIGHT;  Surgeon: Jerri Kay CHRISTELLA, MD;  Location: [facility] OR;  Service: Orthopedics;  Laterality: Right;       Home Medications    Prior to Admission medications   Medication Sig Start Date End Date Taking? Authorizing Provider  amLODipine  (NORVASC ) 5 MG tablet TAKE 1 TABLET(5 MG) BY MOUTH DAILY 04/26/23   Fernand Prost, MD  atorvastatin  (LIPITOR) 40 MG tablet Take 1 tablet (40 mg total) by mouth daily. 11/07/23 11/06/24  Fernand Prost, MD  irbesartan  (AVAPRO ) 150 MG tablet TAKE 1 TABLET(150 MG) BY MOUTH DAILY 12/29/23   Myrna Bitters, DO    Family History Family History  Problem Relation Age of Onset   Cancer Mother    Heart disease Father    Hypertension Father     Social History Social History   Tobacco Use  Smoking status: Never   Smokeless tobacco: Never  Vaping Use   Vaping status: Never Used  Substance Use Topics   Alcohol use: No   Drug use: No     Allergies   Doxycycline  and Penicillins   Review of Systems Review of Systems Per HPI  Physical Exam Triage Vital Signs ED Triage Vitals  Encounter Vitals Group     BP 01/14/24 1608 137/80     Girls Systolic BP Percentile --      Girls Diastolic BP Percentile --      Boys Systolic BP Percentile --      Boys Diastolic BP Percentile --      Pulse Rate 01/14/24 1608 60     Resp 01/14/24 1608 17      Temp 01/14/24 1608 97.9 F (36.6 C)     Temp Source 01/14/24 1608 Oral     SpO2 01/14/24 1608 96 %     Weight --      Height --      Head Circumference --      Peak Flow --      Pain Score 01/14/24 1607 0     Pain Loc --      Pain Education --      Exclude from Growth Chart --    No data found.  Updated Vital Signs BP 137/80 (BP Location: Right Arm)   Pulse 60   Temp 97.9 F (36.6 C) (Oral)   Resp 17   SpO2 96%   Visual Acuity Right Eye Distance:   Left Eye Distance:   Bilateral Distance:    Right Eye Near:   Left Eye Near:    Bilateral Near:     Physical Exam Vitals and nursing note reviewed.  Constitutional:      Appearance: He is not ill-appearing or toxic-appearing.  HENT:     Head: Normocephalic. Abrasion (Multiple scalp abrasions to the left frontal scalp) and contusion (Inferior left periorbital contusion) present. No raccoon eyes, Battle's sign or laceration.     Jaw: There is normal jaw occlusion.     Comments: Negative Battle sign, negative raccoon eyes.    Right Ear: Hearing, tympanic membrane, ear canal and external ear normal.     Left Ear: Hearing, tympanic membrane, ear canal and external ear normal.     Nose: Nose normal.     Mouth/Throat:     Lips: Pink.     Mouth: Mucous membranes are moist. No injury or oral lesions.     Dentition: Normal dentition.     Tongue: No lesions.     Pharynx: Oropharynx is clear. Uvula midline. No pharyngeal swelling, oropharyngeal exudate, posterior oropharyngeal erythema, uvula swelling or postnasal drip.     Tonsils: No tonsillar exudate.  Eyes:     General: Lids are normal. Vision grossly intact. Gaze aligned appropriately.     Extraocular Movements: Extraocular movements intact.     Conjunctiva/sclera: Conjunctivae normal.  Neck:     Trachea: Trachea and phonation normal.  Pulmonary:     Effort: Pulmonary effort is normal.  Musculoskeletal:     Cervical back: Neck supple.  Lymphadenopathy:      Cervical: No cervical adenopathy.  Skin:    General: Skin is warm and dry.     Capillary Refill: Capillary refill takes less than 2 seconds.     Findings: No rash.  Neurological:     General: No focal deficit present.     Mental Status: He is alert  and oriented to person, place, and time. Mental status is at baseline.     GCS: GCS eye subscore is 4. GCS verbal subscore is 5. GCS motor subscore is 6.     Cranial Nerves: Cranial nerves 2-12 are intact. No dysarthria or facial asymmetry.     Sensory: Sensation is intact.     Motor: Motor function is intact. No weakness, tremor, abnormal muscle tone or pronator drift.     Coordination: Coordination is intact. Romberg sign negative. Coordination normal. Finger-Nose-Finger Test normal.     Gait: Gait is intact.     Comments: Strength and sensation intact to bilateral upper and lower extremities (5/5). Moves all 4 extremities with normal coordination voluntarily. Non-focal neuro exam.   Psychiatric:        Mood and Affect: Mood normal.        Speech: Speech normal.        Behavior: Behavior normal.        Thought Content: Thought content normal.        Judgment: Judgment normal.      UC Treatments / Results  Labs (all labs ordered are listed, but only abnormal results are displayed) Labs Reviewed - No data to display  EKG   Radiology No results found.  Procedures Procedures (including critical care time)  Medications Ordered in UC Medications  Tdap (BOOSTRIX ) injection 0.5 mL (0.5 mLs Intramuscular Given 01/14/24 1658)    Initial Impression / Assessment and Plan / UC Course  I have reviewed the triage vital signs and the nursing notes.  Pertinent labs & imaging results that were available during my care of the patient were reviewed by me and considered in my medical decision making (see chart for details).   1.  Injury of head, abrasion of scalp, need for tetanus booster, fall Neurologically intact to baseline on exam.   Scalp abrasions are nonbleeding. Tetanus injection was updated here in the clinic today. Though he is neurologically intact to baseline, he does meet criteria for a head CT to rule out intracranial abnormality as a result of head trauma based on the [place] head trauma CT rule.   I would like for him to proceed to the nearest emergency room to have this head CT performed.   Discussed clinical concerns/exam findings leading to recommendation for further workup in the ER setting and risks of deferring ER visit with patient/family. Patient/family express understanding and agreement with plan, discharged to ER via private car with wife. Final Clinical Impressions(s) / UC Diagnoses   Final diagnoses:  Injury of head, initial encounter  Abrasion of scalp, initial encounter  Need for tetanus booster  Fall, initial encounter     Discharge Instructions      We updated your tetanus injection today. Please go to the nearest ER for CT scan of the head due to concern for head bleed based on criteria.      ED Prescriptions   None    PDMP not reviewed this encounter.   Enedelia Dorna HERO, [place] 01/14/24 [phone]

## 2024-01-14 NOTE — ED Triage Notes (Signed)
 Pt slipped and fell walking up the steps, no LOC- has abrasions to the left forehead and under left eye.

## 2024-01-28 ENCOUNTER — Other Ambulatory Visit: Payer: Self-pay | Admitting: Internal Medicine

## 2024-01-28 DIAGNOSIS — I10 Essential (primary) hypertension: Secondary | ICD-10-CM

## 2024-01-28 DIAGNOSIS — E7849 Other hyperlipidemia: Secondary | ICD-10-CM

## 2024-01-30 ENCOUNTER — Other Ambulatory Visit: Payer: Self-pay | Admitting: Student

## 2024-01-30 DIAGNOSIS — E7849 Other hyperlipidemia: Secondary | ICD-10-CM

## 2024-01-30 MED ORDER — ATORVASTATIN CALCIUM 40 MG PO TABS: 40.0000 mg | ORAL_TABLET | Freq: Every day | ORAL | 3 refills | Status: AC

## 2024-04-17 ENCOUNTER — Ambulatory Visit: Payer: Self-pay | Admitting: *Deleted

## 2024-04-17 DIAGNOSIS — Z23 Encounter for immunization: Secondary | ICD-10-CM

## 2024-06-07 ENCOUNTER — Other Ambulatory Visit: Payer: Self-pay

## 2024-06-07 ENCOUNTER — Ambulatory Visit: Admitting: Student

## 2024-06-07 VITALS — BP 137/85 | HR 71 | Temp 98.1°F | Ht 73.0 in | Wt 231.8 lb

## 2024-06-07 DIAGNOSIS — J069 Acute upper respiratory infection, unspecified: Secondary | ICD-10-CM

## 2024-06-07 DIAGNOSIS — B349 Viral infection, unspecified: Secondary | ICD-10-CM

## 2024-06-07 DIAGNOSIS — Z8249 Family history of ischemic heart disease and other diseases of the circulatory system: Secondary | ICD-10-CM | POA: Diagnosis not present

## 2024-06-07 DIAGNOSIS — I10 Essential (primary) hypertension: Secondary | ICD-10-CM

## 2024-06-07 DIAGNOSIS — Z79899 Other long term (current) drug therapy: Secondary | ICD-10-CM | POA: Diagnosis not present

## 2024-06-07 NOTE — Assessment & Plan Note (Signed)
 Blood pressure slightly elevated here on recheck improved to 137/85.  He has been taking some cough and cold medicine with phenylephrine  and it.  He is overdue for a metabolic panel. - Continue amlodipine  5 mg daily and irbesartan  150 mg daily - BMP today

## 2024-06-07 NOTE — Progress Notes (Signed)
° °  CC: Acute Concern of cough  HPI:  [name] [name] is a 73 y.o. male with pertinent PMH of HTN, HLD, CKDIIIa, prostate cancer s/p prostatectomy 2020, and prediabetes who presents as above. Please see assessment and plan below for further details.  Medications: Current Outpatient Medications  Medication Instructions   amLODipine  (NORVASC ) 5 MG tablet TAKE 1 TABLET(5 MG) BY MOUTH DAILY   atorvastatin  (LIPITOR) 40 mg, Oral, Daily   irbesartan  (AVAPRO ) 150 MG tablet TAKE 1 TABLET(150 MG) BY MOUTH DAILY     Review of Systems:   Pertinent items noted in HPI and/or A&P.  Physical Exam:  Vitals:   06/07/24 1333 06/07/24 1420  BP: (!) 147/74 137/85  Pulse: 71 71  Temp: 98.1 F (36.7 C)   TempSrc: Oral   Weight: 231 lb 12.8 oz (105.1 kg)   Height: 6' 1 (1.854 m)     Constitutional: Well-appearing elderly male. In no acute distress. HEENT: Normocephalic, atraumatic, Sclera non-icteric, PERRL, EOM intact Cardio:Regular rate and rhythm. 2+ bilateral radial pulses. Pulm:Clear to auscultation bilaterally. Normal work of breathing on room air. FDX:Wzhjupcz for extremity edema. Skin:Warm and dry. Neuro:Alert and oriented x3. No focal deficit noted. Psych:Pleasant mood and affect.   Assessment & Plan:   Assessment & Plan Viral upper respiratory tract infection Presents with 1 week of bothersome productive cough with yellow sputum.  Reports that it is slowly getting better and he has taken some over-the-counter cough and cold medicine that worked but also raised his blood pressure.  He denies any fevers, shortness of breath, sore throat, dysphagia, nausea, vomiting, or other associated symptoms.  On exam his lungs are clear and there are no abnormalities in the oropharynx.  Overall consistent with a viral URI but out of the window to treat for flu or COVID.  Recommend over-the-counter cough and cold medicine for high blood pressure, continue to mask around others until feeling better,  and given strict return precautions. Essential hypertension Blood pressure slightly elevated here on recheck improved to 137/85.  He has been taking some cough and cold medicine with phenylephrine  and it.  He is overdue for a metabolic panel. - Continue amlodipine  5 mg daily and irbesartan  150 mg daily - BMP today  Orders Placed This Encounter  Procedures   Basic metabolic panel with GFR     Return in about 6 months (around 12/06/2024) for Routine Follow up.   Patient discussed with Dr. Layman Bee  Fairy Pool, DO [facility] Internal [facility] Resident PGY-3 Clinic Phone: [phone] Please contact the on call pager at [phone] for any urgent or emergent needs.

## 2024-06-07 NOTE — Progress Notes (Signed)
 Internal Medicine Clinic Attending  Case discussed with the resident at the time of the visit.  We reviewed the resident's history and exam and pertinent patient test results.  I agree with the assessment, diagnosis, and plan of care documented in the resident's note.

## 2024-06-07 NOTE — Patient Instructions (Signed)
 Thank you, [name], for allowing us  to provide your care today. Today we discussed . . .  > Cough       - I am very glad that your cough has been getting better.  I do not think we need to test for the flu or COVID but I would recommend that while you still feel a little bit sick you continue to wear a mask around people.  You can pick up cough and cold medicine over-the-counter but please pick up medicine that does not have phenylephrine  in it.  These medicines will be denoted by an HBP or safe for high blood pressure.  Please let us  know if you start to have any new or worsening symptoms. > Colonoscopy       - I have put the information for the gastroenterologist office below.  Please give them a call to schedule your colonoscopy at your convenience.  [facility] Gastroenterologist in [place], [place]  [address] [place] [address], [place], [place] [address] Phone: [phone]  I have ordered the following labs for you:  Lab Orders         Basic metabolic panel with GFR       Follow up: 6 months    Remember:  Should you have any questions or concerns please call the internal medicine clinic at [phone].     Fairy Pool, DO [facility]

## 2024-06-08 LAB — BASIC METABOLIC PANEL WITH GFR
BUN/Creatinine Ratio: 12 (ref 10–24)
BUN: 19 mg/dL (ref 8–27)
CO2: 22 mmol/L (ref 20–29)
Calcium: 9.2 mg/dL (ref 8.6–10.2)
Chloride: 100 mmol/L (ref 96–106)
Creatinine, Ser: 1.53 mg/dL — ABNORMAL HIGH (ref 0.76–1.27)
Glucose: 116 mg/dL — ABNORMAL HIGH (ref 70–99)
Potassium: 3.9 mmol/L (ref 3.5–5.2)
Sodium: 140 mmol/L (ref 134–144)
eGFR: 49 mL/min/1.73 — ABNORMAL LOW

## 2024-10-31 ENCOUNTER — Ambulatory Visit
